# Patient Record
Sex: Female | Born: 1965 | Race: White | Hispanic: No | Marital: Married | Smoking: Never smoker
Health system: Southern US, Community
[De-identification: ages and names within clinical notes are randomized; demographics above are authoritative.]

## PROBLEM LIST (undated history)

## (undated) DIAGNOSIS — F329 Major depressive disorder, single episode, unspecified: Secondary | ICD-10-CM

## (undated) DIAGNOSIS — I10 Essential (primary) hypertension: Secondary | ICD-10-CM

## (undated) DIAGNOSIS — F419 Anxiety disorder, unspecified: Secondary | ICD-10-CM

## (undated) DIAGNOSIS — Z87898 Personal history of other specified conditions: Secondary | ICD-10-CM

## (undated) DIAGNOSIS — R079 Chest pain, unspecified: Secondary | ICD-10-CM

## (undated) DIAGNOSIS — F32A Depression, unspecified: Secondary | ICD-10-CM

## (undated) DIAGNOSIS — J302 Other seasonal allergic rhinitis: Secondary | ICD-10-CM

## (undated) HISTORY — DX: Anxiety disorder, unspecified: F41.9

## (undated) HISTORY — DX: Personal history of other specified conditions: Z87.898

## (undated) HISTORY — DX: Essential (primary) hypertension: I10

## (undated) HISTORY — DX: Major depressive disorder, single episode, unspecified: F32.9

## (undated) HISTORY — DX: Chest pain, unspecified: R07.9

---

## 2009-05-10 ENCOUNTER — Ambulatory Visit: Payer: Self-pay | Admitting: Family Medicine

## 2009-08-02 ENCOUNTER — Ambulatory Visit: Payer: Self-pay | Admitting: Family Medicine

## 2009-08-02 DIAGNOSIS — R002 Palpitations: Secondary | ICD-10-CM | POA: Insufficient documentation

## 2009-09-04 ENCOUNTER — Ambulatory Visit: Payer: Self-pay | Admitting: Family Medicine

## 2010-05-21 NOTE — Assessment & Plan Note (Signed)
Summary: SINUS INFECTION/KH   Vital Signs:  Patient Profile:   45 Years Old Female CC:      Sinus congestion, pressure x 3 days Height:     65 inches Weight:      145 pounds O2 Sat:      100 % O2 treatment:    Room Air Temp:     99.9 degrees F oral Pulse rate:   82 / minute Pulse rhythm:   regular Resp:     16 per minute BP sitting:   112 / 76  (right arm) Cuff size:   regular  Vitals Entered By: Emilio Math (May 10, 2009 10:19 AM)                  Current Allergies: ! PENICILLIN ! SULFA ! CODEINEHistory of Present Illness Chief Complaint: Sinus congestion, pressure x 3 days History of Present Illness: AS ABOVE. NO FEVER. HX OF SINUS PROBLEMS. FACIAL PAIN. NO SORE THROAT OR COUGH. TAKING SUDAFED.   REVIEW OF SYSTEMS Constitutional Symptoms      Denies fever, chills, night sweats, weight loss, weight gain, and fatigue.  Eyes       Denies change in vision, eye pain, eye discharge, glasses, contact lenses, and eye surgery. Ear/Nose/Throat/Mouth       Complains of sinus problems.      Denies hearing loss/aids, change in hearing, ear pain, ear discharge, dizziness, frequent runny nose, frequent nose bleeds, sore throat, hoarseness, and tooth pain or bleeding.  Respiratory       Denies dry cough, productive cough, wheezing, shortness of breath, asthma, bronchitis, and emphysema/COPD.  Cardiovascular       Denies murmurs, chest pain, and tires easily with exhertion.    Gastrointestinal       Denies stomach pain, nausea/vomiting, diarrhea, constipation, blood in bowel movements, and indigestion. Genitourniary       Denies painful urination, kidney stones, and loss of urinary control. Neurological       Denies paralysis, seizures, and fainting/blackouts. Musculoskeletal       Denies muscle pain, joint pain, joint stiffness, decreased range of motion, redness, swelling, muscle weakness, and gout.  Skin       Denies bruising, unusual mles/lumps or sores, and hair/skin  or nail changes.  Psych       Denies mood changes, temper/anger issues, anxiety/stress, speech problems, depression, and sleep problems.  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 Exam General appearance: well developed, well nourished, no acute distress Eyes: conjunctivae and lids normal Ears: normal, no lesions or deformities Nasal: CONGESTED WITH FACIAL PAIN AND PRESSURE Oral/Pharynx: tongue normal, posterior pharynx without erythema or exudate Chest/Lungs: no rales, wheezes, or rhonchi bilateral, breath sounds equal without effort Heart: regular rate and  rhythm, no murmur Assessment New Problems: ACUTE SINUSITIS, UNSPECIFIED (ICD-461.9)   Plan New Medications/Changes: ZITHROMAX TRI-PAK 500 MG TABS (AZITHROMYCIN) TAKE AS DIRECTED  #1 x 0, 05/10/2009, Marvis Moeller DO  New Orders: New Patient Level III [99203]   Prescriptions: ZITHROMAX TRI-PAK 500 MG TABS (AZITHROMYCIN) TAKE AS DIRECTED  #1 x 0   Entered and Authorized by:   Marvis Moeller DO   Signed by:   Marvis Moeller DO on 05/10/2009   Method used:   Print then Give to Patient   RxID:   [id number]   Patient  Instructions: 1)  TYLENOL OR MOTRIN AS NEEDED. MUCINEX D RECOMMENDED. AVOID CAFFEINE AND MILK PRODUCTS. FOLLOW UP WITH YOUR PCP IF SYMPTOMS PERSIST OR WORSEN.

## 2010-05-21 NOTE — Assessment & Plan Note (Signed)
Summary: NOV cough   Vital Signs:  Patient profile:   45 year old female Height:      65 inches Weight:      144 pounds BMI:     24.05 O2 Sat:      100 % on Room air Temp:     99.3 degrees F oral Pulse rate:   66 / minute BP sitting:   124 / 84  (left arm) Cuff size:   regular  Vitals Entered By: Payton Spark CMA (August 02, 2009 10:24 AM)  O2 Flow:  Room air CC: New to est. Chest congestion, Cough, ringing in ears and ST after coughing. Started after painting on 07/28/09.   Primary Care Provider:  Seymour Bars DO  CC:  New to est. Chest congestion, Cough, and ringing in ears and ST after coughing. Started after painting on 07/28/09.Marland Kitchen  History of Present Illness: 45 yo WF presents for NOV.    She was previously seen at [facility] but moved to [place].  She sees [facility] for pap smears.  Her OB put her back on Lexapro for depression and anxiety 2 mos ago.  She had to change to Celexa due to insurance but it made her very sick with nausea.  She plans to restart Lexapro. tomorrow.  She had done Lexapro in the past and did well on it.    She has cough and congestion that started 5 days ago.  She has allergies.  She was painting over the weekened and that's when this started.  The paint semed to burn her nose and her throat.  She has ringing ears.  Her cough is very dry.  She denies a hx of asthma except in childhood.  She has some chest tightness now but not really SOB.  No fevers or chills.  She has not taken anything for allergies.  She thinks that Claritin caused her heart palpitations 3 yrs ago.  She has continued to have heart palpitations.  She saw a cardiologist for this about 4 ys ago and wore a heart monitor.  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    Current Medications (verified): 1)  Lexapro 5 Mg Tabs (Escitalopram Oxalate) .... Take 1 Tab By Mouth Once Daily  Allergies (verified): 1)  ! Penicillin 2)  ! Sulfa 3)  ! Codeine  Past History:  Past Medical History: G4P4 OB -  [facility] anxiety/ depression heart palpitations  Past Surgical History: Reviewed history from 05/10/2009 and no changes required. 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  Review of Systems       no fevers/sweats/weakness, unexplained wt loss/gain, no change in vision, no difficulty hearing, ringing in ears, no hay fever/+allergies, no CP/discomfort, no palpitations, no breast lump/nipple discharge, + cough/wheeze, no blood in stool,no  N/V/D, no nocturia, no leaking urine, no unusual vag bleeding, no vaginal/penile discharge, no muscle/joint pain, no rash, no new/changing mole, no HA, no memory loss, no anxiety, no sleep problem, no depression, no unexplained lumps, no easy bruising/bleeding, no concern with sexual function   Physical Exam  General:  alert, well-developed, well-nourished, and well-hydrated.   Head:  normocephalic and atraumatic.   Eyes:  conjunctiva clear no lid edema, watering or discharge Nose:  boggy turbinates with scant clear rhinorrhea Mouth:  o/p pink and moist, good dentition Neck:  no masses.   Lungs:  Normal  respiratory effort, chest expands symmetrically. Lungs are clear to auscultation, no crackles or wheezes. dry hacking cough Heart:  Normal rate and regular rhythm. S1 and S2 normal without gallop, murmur, click, rub or other extra sounds. Extremities:  no E/C/C Skin:  color normal and no rashes.   Cervical Nodes:  No lymphadenopathy noted Psych:  good eye contact, not anxious appearing, and not depressed appearing.     Impression & Recommendations:  Problem # 1:  CHEMICAL PNEUMONITIS (ICD-506.0) Secondary to inhalation of paint fumes and hx of allergies. Treat with 5 days of Prednisone 40 mg once a day and ProAir inhaler 2 puffs 4 x a day for 1 wk. Call if not improving. F/U in 1 month.  Avoid  reexposure.  Problem # 2:  PALPITATIONS (ICD-785.1) She had w/u for this 4 yrs ago with cards that was normal. She has seen OB for anxiety and goes back next wk for labs and new start Lexapro. Will see her back for f/u in 1 month -- review labs, get EKG.    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 Will try to come up with a better treatment plan for her -- perphaps a different antihistamine or just nasal steroid spray.  Complete Medication List: 1)  Lexapro 5 Mg Tabs (Escitalopram oxalate) .... Take 1 tab by mouth once daily 2)  Prednisone 20 Mg Tabs (Prednisone) .... 2 tabs by mouth qam x 5 days 3)  Proair Hfa 108 (90 Base) Mcg/act Aers (Albuterol sulfate) .... 2 puffs q 4- 6 hrs prn  Patient Instructions: 1)  Take 40 mg of prednsione once a day x 5 days for chemical pneumonitits.   2)  Use inhaler 2 puffs 4 x a day for the next wk, then use as needed. 3)  F/U with OB for labs/ Lexapro. 4)  Return in 1 month for f/u allergies and palpitations. Prescriptions: PROAIR HFA 108 (90 BASE) MCG/ACT AERS (ALBUTEROL SULFATE) 2 puffs q 4- 6 hrs prn  #1 x 0   Entered and Authorized by:   Seymour Bars DO   Signed by:   Seymour Bars DO on 08/02/2009   Method used:   Electronically to        [facility] [phone]* (retail)       [address]       [place], [place]  [address]       Ph: [phone]       Fax: [phone]   RxID:   [id number] PREDNISONE 20 MG TABS (PREDNISONE) 2 tabs by mouth qAM x 5 days  #10 x 0   Entered and Authorized by:   Seymour Bars DO   Signed by:   Seymour Bars DO on 08/02/2009   Method used:   Electronically to        [facility] [phone]* (retail)       [address]       [place], [place]  [address]       Ph: [phone]       Fax: [phone]   RxID:   [id number]

## 2010-11-20 ENCOUNTER — Inpatient Hospital Stay (INDEPENDENT_AMBULATORY_CARE_PROVIDER_SITE_OTHER)
Admission: RE | Admit: 2010-11-20 | Discharge: 2010-11-20 | Disposition: A | Discharge status: Home / Self Care | Payer: BC Managed Care – PPO | Source: Ambulatory Visit | Attending: Emergency Medicine | Admitting: Emergency Medicine

## 2010-11-20 ENCOUNTER — Encounter: Payer: Self-pay | Admitting: Emergency Medicine

## 2010-11-20 DIAGNOSIS — N390 Urinary tract infection, site not specified: Secondary | ICD-10-CM

## 2010-11-20 LAB — CONVERTED CEMR LAB
Nitrite: POSITIVE
Specific Gravity, Urine: 1.025
Urobilinogen, UA: 0.2
pH: 5.5

## 2010-11-22 ENCOUNTER — Telehealth (INDEPENDENT_AMBULATORY_CARE_PROVIDER_SITE_OTHER): Payer: Self-pay | Admitting: *Deleted

## 2011-03-24 NOTE — Progress Notes (Signed)
Summary: POSS UTI/TJ   Vital Signs:  Patient Profile:   45 Years Old Female CC:      dysuria and frequency x 2 days Height:     65 inches Weight:      157 pounds O2 Sat:      99 % O2 treatment:    Room Air Temp:     98.6 degrees F oral Pulse rate:   83 / minute Resp:     16 per minute BP sitting:   108 / 76  (left arm) Cuff size:   regular  Vitals Entered By: Lajean Saver RN (November 20, 2010 2:37 PM)                  Updated Prior Medication List: LEXAPRO 5 MG TABS (ESCITALOPRAM OXALATE) Take 1 tab by mouth once daily * BIRTH CONTROL- UNKNOWN NAME   Current Allergies: ! PENICILLIN ! SULFA ! CODEINE ! * PORK ! * PEANUTSHistory of Present Illness History from: patient Chief Complaint: dysuria and frequency x 2 days History of Present Illness: 45 Years Old Female complains of UTI symptoms for 2 days.  She describes the pain as burning during urination.  She has not used any OTC meds. + dysuria + frequency + urgency No hematuria No vaginal discharge No fever/chills + lower abdomenal pain ? back pain No fatigue   REVIEW OF SYSTEMS Constitutional Symptoms      Denies fever, chills, night sweats, weight loss, weight gain, and fatigue.  Eyes       Denies change in vision, eye pain, eye discharge, glasses, contact lenses, and eye surgery. Ear/Nose/Throat/Mouth       Denies hearing loss/aids, change in hearing, ear pain, ear discharge, dizziness, frequent runny nose, frequent nose bleeds, sinus problems, sore throat, hoarseness, and tooth pain or bleeding.  Respiratory       Denies dry cough, productive cough, wheezing, shortness of breath, asthma, bronchitis, and emphysema/COPD.  Cardiovascular       Denies murmurs, chest pain, and tires easily with exhertion.    Gastrointestinal       Denies stomach pain, nausea/vomiting, diarrhea, constipation, blood in bowel movements, and indigestion. Genitourniary       Complains of painful urination.      Denies kidney  stones and loss of urinary control.      Comments: frequency Neurological       Denies paralysis, seizures, and fainting/blackouts. Musculoskeletal       Denies muscle pain, joint pain, joint stiffness, decreased range of motion, redness, swelling, muscle weakness, and gout.  Skin       Denies bruising, unusual mles/lumps or sores, and hair/skin or nail changes.  Psych       Denies mood changes, temper/anger issues, anxiety/stress, speech problems, depression, and sleep problems. Other Comments: x 2 days   Past History:  Past Medical History: Reviewed history from 08/02/2009 and no changes required. G4P4 OB - [facility] anxiety/ depression heart palpitations  Past Surgical History: Reviewed history from 05/10/2009 and no changes required. Caesarean section  Family History: Reviewed history from 08/02/2009 and no changes required. Mother, Diabetes, HTN, Breast CA Father, D, Alcoholism, Lung CA  Social History: Reviewed history from 08/02/2009 and no changes required. 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  Plan New Medications/Changes: PHENAZOPYRIDINE HCL 200 MG TABS (PHENAZOPYRIDINE HCL) 1 by mouth three times a day for 2 days as needed for burning  #6 x 0, 11/20/2010, Hoyt Koch MD CIPROFLOXACIN HCL 500 MG TABS (CIPROFLOXACIN HCL) 1 by mouth two times a day for 5 days  #10 x 0, 11/20/2010, Hoyt Koch MD  New Orders: Est. Patient Level III [19147] UA Dipstick w/o Micro (automated)  [81003] T-Culture, Urine [[id number]] Planning Comments:   Urine  culture pending.  Follow-up with your primary care physician if not improving or if getting worse   The patient and/or caregiver has been counseled thoroughly with regard to medications prescribed including dosage, schedule, interactions, rationale for use, and possible side effects and they verbalize understanding.  Diagnoses and expected course of recovery discussed and will return if not improved as expected or if the condition worsens. Patient and/or caregiver verbalized understanding.  Prescriptions: PHENAZOPYRIDINE HCL 200 MG TABS (PHENAZOPYRIDINE HCL) 1 by mouth three times a day for 2 days as needed for burning  #6 x 0   Entered and Authorized by:   Hoyt Koch MD   Signed by:   Hoyt Koch MD on 11/20/2010   Method used:   Print then Give to Patient   RxID:   [id number] CIPROFLOXACIN HCL 500 MG TABS (CIPROFLOXACIN HCL) 1 by mouth two times a day for 5 days  #10 x 0   Entered and Authorized by:   Hoyt Koch MD   Signed by:   Hoyt Koch MD on 11/20/2010   Method used:   Print then Give to Patient   RxID:   [id number]   Orders Added: 1)  Est. Patient Level III [34742] 2)  UA Dipstick w/o Micro (automated)  [81003] 3)  T-Culture, Urine [[id number]]    Laboratory Results   Urine Tests  Date/Time Received: November 20, 2010 2:40 PM  Date/Time Reported: November 20, 2010 2:40 PM   Routine Urinalysis   Color: yellow Appearance: turbid Glucose: negative   (Normal Range: Negative) Bilirubin: negative   (Normal Range: Negative) Ketone: negative   (Normal Range: Negative) Spec. Gravity: 1.025   (Normal Range: 1.003-1.035) Blood: 2+   (Normal Range: Negative) pH: 5.5   (Normal Range: 5.0-8.0) Protein: trace   (Normal Range: Negative) Urobilinogen: 0.2   (Normal Range: 0-1) Nitrite: positive   (Normal Range: Negative) Leukocyte Esterace: 3+   (Normal Range: Negative)

## 2011-03-24 NOTE — Telephone Encounter (Signed)
  Phone Note Outgoing Call   Call placed by: Lajean Saver RN,  November 22, 2010 10:07 AM Call placed to: Patient Action Taken: Phone Call Completed Summary of Call: Callback: Patient reports she is improving and is taking antibiotics. Urine culture result given.

## 2011-06-06 ENCOUNTER — Emergency Department
Admission: EM | Admit: 2011-06-06 | Discharge: 2011-06-06 | Disposition: A | Discharge status: Home / Self Care | Payer: 59 | Source: Home / Self Care | Attending: Family Medicine | Admitting: Family Medicine

## 2011-06-06 ENCOUNTER — Emergency Department
Admit: 2011-06-06 | Discharge: 2011-06-06 | Disposition: A | Discharge status: Home / Self Care | Payer: 59 | Attending: Family Medicine | Admitting: Family Medicine

## 2011-06-06 ENCOUNTER — Encounter: Payer: Self-pay | Admitting: *Deleted

## 2011-06-06 DIAGNOSIS — M7750 Other enthesopathy of unspecified foot: Secondary | ICD-10-CM

## 2011-06-06 DIAGNOSIS — M7660 Achilles tendinitis, unspecified leg: Secondary | ICD-10-CM

## 2011-06-06 DIAGNOSIS — M7661 Achilles tendinitis, right leg: Secondary | ICD-10-CM

## 2011-06-06 DIAGNOSIS — M76829 Posterior tibial tendinitis, unspecified leg: Secondary | ICD-10-CM

## 2011-06-06 DIAGNOSIS — M7670 Peroneal tendinitis, unspecified leg: Secondary | ICD-10-CM

## 2011-06-06 HISTORY — DX: Major depressive disorder, single episode, unspecified: F32.9

## 2011-06-06 HISTORY — DX: Other seasonal allergic rhinitis: J30.2

## 2011-06-06 HISTORY — DX: Depression, unspecified: F32.A

## 2011-06-06 NOTE — ED Provider Notes (Signed)
History     CSN: [id number]  Arrival date & time 06/06/11  1478   First MD Initiated Contact with Patient 06/06/11 [phone]      Chief Complaint  Patient presents with  . Foot Pain    right heel      HPI Comments: Patient states that she stepped in parking lot "pot hole" 3 months ago and twisted her right foot and ankle.  The initial swelling and pain improved, and then worsened about one month ago.  She complains of persistent pain in her posterior ankle, and medial and lateral ankle.  Patient is a 46 y.o. female presenting with ankle pain. The history is provided by the patient.  Ankle Pain This is a chronic problem. Episode onset: 3 months ago. The problem occurs constantly. The problem has been gradually worsening. Pertinent negatives include no chest pain and no shortness of breath. The symptoms are aggravated by walking and standing. The symptoms are relieved by nothing. She has tried nothing for the symptoms.    Past Medical History  Diagnosis Date  . Depression   . Seasonal allergies     Past Surgical History  Procedure Date  . Cesarean section 1998    Family History  Problem Relation Age of Onset  . Cancer Mother     Breast  . Diabetes Mother   . Stroke Mother   . Heart failure Father   . Stroke Father     History  Substance Use Topics  . Smoking status: Non smoker  . Smokeless tobacco: Never Used  . Alcohol Use: No    OB History    Grav Para Term Preterm Abortions TAB SAB Ect Mult Living                  Review of Systems  Respiratory: Negative for shortness of breath.   Cardiovascular: Negative for chest pain.  All other systems reviewed and are negative.    Allergies  Codeine; Peanut-containing drug products; Penicillins; Pork allergy; and Sulfonamide derivatives  Home Medications   Current Outpatient Rx  Name Route Sig Dispense Refill  . ESCITALOPRAM OXALATE 10 MG PO TABS Oral Take 10 mg by mouth daily.    . IBUPROFEN 200 MG PO TABS Oral  Take 200 mg by mouth every 6 (six) hours as needed.    Marland Kitchen LORATADINE 10 MG PO TABS Oral Take 10 mg by mouth daily.    Marland Kitchen NORGESTREL-ETHINYL ESTRADIOL 0.3-30 MG-MCG PO TABS Oral Take 1 tablet by mouth daily.      BP 116/81  Pulse 75  Temp(Src) 98.5 F (36.9 C) (Oral)  Resp 16  Ht 5\' 5"  (1.651 m)  Wt 163 lb (73.936 kg)  BMI 27.12 kg/m2  SpO2 98%  LMP 06/05/2010  Physical Exam Nursing notes and Vital Signs reviewed. Appearance:  Patient appears healthy, stated age, and in no acute distress Right lower extremity:  No edema.  No calf tenderness Right foot/ankle:  No deformities.  Ankle range of motion slightly decreased.  There is distinct tenderness at insertion of achilles tendon.  No swelling, erythema, or warmth.  There is tenderness just posterior to both the lateral malleolus and the medial malleolus.  Foot reveals tenderness at the base of 5th metatarsal.     ED Course  Procedures  none  Labs Reviewed - No data to display Dg Ankle Complete Right  06/06/2011  *RADIOLOGY REPORT*  Clinical Data:  Right ankle injury with pain.  RIGHT ANKLE - COMPLETE 3+ VIEW  Comparison:  None.  Findings:  There is no evidence of fracture, dislocation, or joint effusion.  There is no evidence of arthropathy or other focal bone abnormality.  Soft tissues are unremarkable.  IMPRESSION: Negative.  Original Report Authenticated By: Reola Calkins, M.D.   Dg Foot Complete Right  06/06/2011  *RADIOLOGY REPORT*  Clinical Data: Remote injury.  Heel and lateral foot pain.  RIGHT FOOT COMPLETE - 3+ VIEW  Comparison: None.  Findings: No acute bony abnormality.  Specifically, no fracture, subluxation, or dislocation.  Soft tissues are intact.  IMPRESSION: Normal study.  Original Report Authenticated By: Cyndie Chime, M.D.     1. Posterior tibial tendonitis   2. Peroneal tendonitis   3. Tendonitis, Achilles, right       MDM  AirCast splint applied.  Advised to ensure well fitting shoe inserts with  adequate arch support. Begin applying ice pack 2 or 3 times daily.  Begin Ibuprofen 200mg , 4 tabs every 8 hours with food.  Begin exercises as per instruction sheet.  Wear AirCast splint 2 to 3 weeks. Followup with [facility] if not improving about two weeks.         Donna Christen, MD 06/07/11 [phone]

## 2011-06-06 NOTE — ED Notes (Signed)
Right heel pain for 3 months after she stepped in a hole and twisted her ankle. Pain is a 8/10 when she first stands and then lowers to 6/10 after walking. Pain is constant and has been worse over the last month. She states the pain is moderate to severe. She did try Advil with little relief.

## 2011-06-06 NOTE — Discharge Instructions (Signed)
Begin applying ice pack 2 or 3 times daily.  Begin Ibuprofen 200mg , 4 tabs every 8 hours with food.  Begin exercises as per instruction sheet.  Wear AirCast splint 2 to 3 weeks.

## 2011-06-26 ENCOUNTER — Other Ambulatory Visit: Payer: Self-pay | Admitting: Sports Medicine

## 2011-06-26 DIAGNOSIS — R52 Pain, unspecified: Secondary | ICD-10-CM

## 2011-07-05 ENCOUNTER — Ambulatory Visit
Admission: RE | Admit: 2011-07-05 | Discharge: 2011-07-05 | Disposition: A | Discharge status: Home / Self Care | Payer: 59 | Source: Ambulatory Visit | Attending: Sports Medicine | Admitting: Sports Medicine

## 2011-07-05 DIAGNOSIS — R52 Pain, unspecified: Secondary | ICD-10-CM

## 2011-07-14 ENCOUNTER — Ambulatory Visit: Payer: 59 | Attending: Sports Medicine | Admitting: Physical Therapy

## 2011-07-15 ENCOUNTER — Ambulatory Visit: Payer: 59 | Admitting: Physical Therapy

## 2011-07-16 ENCOUNTER — Encounter: Payer: 59 | Admitting: Physical Therapy

## 2012-03-22 ENCOUNTER — Encounter (HOSPITAL_COMMUNITY): Payer: Self-pay | Admitting: Psychiatry

## 2012-03-22 ENCOUNTER — Ambulatory Visit (INDEPENDENT_AMBULATORY_CARE_PROVIDER_SITE_OTHER): Payer: BC Managed Care – PPO | Admitting: Psychiatry

## 2012-03-22 ENCOUNTER — Telehealth (HOSPITAL_COMMUNITY): Payer: Self-pay

## 2012-03-22 VITALS — BP 147/95 | HR 78 | Ht 66.5 in | Wt 162.0 lb

## 2012-03-22 DIAGNOSIS — F331 Major depressive disorder, recurrent, moderate: Secondary | ICD-10-CM

## 2012-03-22 MED ORDER — FLUOXETINE HCL 20 MG PO CAPS: 20.0000 mg | ORAL_CAPSULE | Freq: Every day | ORAL | Status: DC

## 2012-03-22 MED ORDER — BUPROPION HCL ER (XL) 150 MG PO TB24: 150.0000 mg | ORAL_TABLET | ORAL | Status: DC

## 2012-03-22 MED ORDER — FLUOXETINE HCL 40 MG PO CAPS: 40.0000 mg | ORAL_CAPSULE | Freq: Every day | ORAL | Status: DC

## 2012-03-22 NOTE — Telephone Encounter (Signed)
Have resent prescriptions for medications.

## 2012-03-22 NOTE — Telephone Encounter (Signed)
Can you please resend the prozac as 40mg  to [facility]
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
## 2012-03-25 ENCOUNTER — Ambulatory Visit: Payer: Self-pay | Admitting: Family Medicine

## 2012-03-31 ENCOUNTER — Encounter: Payer: Self-pay | Admitting: Family Medicine

## 2012-03-31 ENCOUNTER — Ambulatory Visit (INDEPENDENT_AMBULATORY_CARE_PROVIDER_SITE_OTHER): Payer: BC Managed Care – PPO | Admitting: Family Medicine

## 2012-03-31 VITALS — BP 134/92 | HR 110 | Temp 98.3°F | Wt 157.0 lb

## 2012-03-31 DIAGNOSIS — R0602 Shortness of breath: Secondary | ICD-10-CM

## 2012-03-31 DIAGNOSIS — F329 Major depressive disorder, single episode, unspecified: Secondary | ICD-10-CM

## 2012-03-31 DIAGNOSIS — Z8679 Personal history of other diseases of the circulatory system: Secondary | ICD-10-CM

## 2012-03-31 DIAGNOSIS — R079 Chest pain, unspecified: Secondary | ICD-10-CM

## 2012-03-31 DIAGNOSIS — R002 Palpitations: Secondary | ICD-10-CM

## 2012-03-31 DIAGNOSIS — G43909 Migraine, unspecified, not intractable, without status migrainosus: Secondary | ICD-10-CM

## 2012-03-31 DIAGNOSIS — Z87898 Personal history of other specified conditions: Secondary | ICD-10-CM

## 2012-03-31 HISTORY — DX: Chest pain, unspecified: R07.9

## 2012-03-31 HISTORY — DX: Major depressive disorder, single episode, unspecified: F32.9

## 2012-03-31 HISTORY — DX: Personal history of other specified conditions: Z87.898

## 2012-03-31 MED ORDER — PROMETHAZINE HCL 50 MG PO TABS: ORAL_TABLET | ORAL | Status: DC

## 2012-03-31 NOTE — Progress Notes (Signed)
CC: Darlene Ochoa is a 46 y.o. female is here for Establish Care and hospital f/u   Subjective: HPI:  Patient presents to establish care.  Quite a significant past medical history including hospitalization [facility] on December 7-9. This visit was prompted by chest pain, shortness of breath, and palpitations that have been occurring for 2 weeks prior to admission. Prior to this the patient reports losing vision in her left eye for 30 minutes on the week of Thanksgiving, described as a curtain over her vision that resolved spontaneously without any intervention. Additionally for the past month or 2 she has had a decline in her depression as she describes lying on the couch and sleeping throughout the day only waking to do laundry, and 2 children, and cook.  Patient's description of her admission and a review of outside reports and records show that she had quite a reassuring workup. Cardiac enzymes were negative x4, treadmill stress test negative, nuclear cardiac perfusion exam normal, carotid ultrasounds normal, CT of the head normal, normal TSH, A1c of 4.9 no major abnormalities on CBC his with differential, unremarkable metabolic panels. Negative d-dimer  Interestingly the patient had multiple episodes of dizziness, shortness of breath, and subjective tachycardia during observation which reportedly never revealed desaturations, objective tachycardia nor any other abnormal vitals.  Since discharge she complains of episodes throughout the week of something sitting on her chest, shortness of breath that often wakes her from sleep, racing heartbeat at all last for matter of minutes which she was able to improve/resolve with focused deep breathing. The symptoms can occur at rest, when carrying laundry, or when out in public, she's under able to identify specific triggers. She reports having these symptoms before starting Wellbutrin.  She's concerned because she has a past medical history  of migraines that occur once every one to 2 weeks, described as a sharp pulsatile pressure in the left back of her head that slowly migrated forward if she doesn't take Imitrex minutes after it starts. As the pain involves it is associated with nausea and photophobia. She is never been on suppressive therapy. Imitrex was stopped  during her admission due to concerns of serotonin syndrome. She inquires about replacement medication.  Review of Systems - General ROS: negative for - chills, fever, night sweats, weight gain or weight loss Ophthalmic ROS: negative for - decreased vision since discharge Psychological ROS: negative for worsening depression or anxiety ENT ROS: negative for - hearing change, nasal congestion, tinnitus or allergies Hematological and Lymphatic ROS: negative for - bleeding problems, bruising or swollen lymph nodes Breast ROS: negative Respiratory ROS: no cough,  wheezing Cardiovascular ROS: See history of present illness Gastrointestinal ROS: no abdominal pain, change in bowel habits, or black or bloody stools Genito-Urinary ROS: negative for - genital discharge, genital ulcers, incontinence or abnormal bleeding from genitals Musculoskeletal ROS: negative for - joint pain or muscle pain Neurological ROS: negative for - headaches or memory loss Dermatological ROS: negative for lumps, mole changes, rash and skin lesion changes    Review Of Systems Outlined In HPI  Past Medical History  Diagnosis Date  . Depression   . Seasonal allergies   . Hypertension   . Major depressive disorder 03/31/2012  . Chest pain 03/31/2012  . H/O prolonged Q-T interval on ECG 03/31/2012     Family History  Problem Relation Age of Onset  . Diabetes Mother   . Cancer Mother     Breast  . Depression Mother   .  Heart failure Father   . Stroke Father   . Alcohol abuse Father   . Alcohol abuse Brother   . Drug abuse Brother      History  Substance Use Topics  . Smoking status:  Never Smoker   . Smokeless tobacco: Never Used  . Alcohol Use: No     Objective: Filed Vitals:   03/31/12 0844  BP: 134/92  Pulse: 110  Temp: 98.3 F (36.8 C)    General: Alert and Oriented, No Acute Distress HEENT: Pupils equal, round, reactive to light. Conjunctivae clear.  External ears unremarkable, canals clear with intact TMs with appropriate landmarks.  Middle ear appears open without effusion. Pink inferior turbinates.  Moist mucous membranes, pharynx without inflammation nor lesions.  Neck supple without palpable lymphadenopathy nor abnormal masses. Lungs: Clear to auscultation bilaterally, no wheezing/ronchi/rales.  Comfortable work of breathing. Good air movement. Cardiac: Regular rate and rhythm. Normal S1/S2.  No murmurs, rubs, nor gallops.   Abdomen: Soft nontender to palpation Neuro: CN II-XII grossly intact, full strength/rom of all four extremities, C5/L4/S1 DTRs 2/4 bilaterally, gait normal, rapid alternating movements normal, Extremities: No peripheral edema.  Strong peripheral pulses.  Mental Status: No depression, anxiety, nor agitation. Pleasant, well-dressed, normal/logical thought process, makes good eye contact Skin: Warm and dry.  Assessment & Plan: Darlene Ochoa was seen today for establish care and hospital f/u.  Diagnoses and associated orders for this visit:  Palpitations  Major depressive disorder  Chest pain  Shortness of breath - Pulmonary function test; Future  Migraine - promethazine (PHENERGAN) 50 MG tablet; Take with two Aleve at onset of migraine.  H/o prolonged q-t interval on ecg      Maj. depressive disorder: Sounds to be improving, she continues to Dr. Demetrius Charity in behavioral health for medication management. Chest pain and shortness of breath: Unchanged however have a very low suspicion for cardiac etiology given her unremarkable monitored stay at [facility], And very reassuring vascular and cardiac studies. This reassurance  was discussed with the patient.  Suspicious that anxiety may play into her shortness of breath, chest pain, and fatigue however will rule out more serious pathology with PFTs. Migraine: I expect that this will be problematic now that Imitrex is no longer safe for, we will try promethazine and Aleve at the onset of headaches.  45 minutes spent face-to-face during visit today of which at least 50% was counseling or coordinating care.   Followup will be based on the results from the PFTs, I've asked her to return to 3 days after the PFTs to discuss further management.

## 2012-04-20 ENCOUNTER — Ambulatory Visit (INDEPENDENT_AMBULATORY_CARE_PROVIDER_SITE_OTHER): Payer: BC Managed Care – PPO | Admitting: Internal Medicine

## 2012-04-20 DIAGNOSIS — R0602 Shortness of breath: Secondary | ICD-10-CM

## 2012-04-20 LAB — PULMONARY FUNCTION TEST

## 2012-04-20 NOTE — Progress Notes (Signed)
PFT done today. 

## 2012-04-25 ENCOUNTER — Telehealth: Payer: Self-pay | Admitting: Family Medicine

## 2012-04-25 NOTE — Telephone Encounter (Signed)
[name], Will you please let Darlene Ochoa know that her lung function tests were very reassuring and did not show any abnormalities.  Given this along with all the reassuring studies during her hospitalization I'm led to believe that her chest pain, shortness of breath, and racing heart beat are all likely due to stress and/or anxiety.  I'd like her to follow up with Dr. Demetrius Charity in order to update him on all the recent reassuring studies. Thank you

## 2012-04-26 ENCOUNTER — Encounter (HOSPITAL_COMMUNITY): Payer: Self-pay | Admitting: Psychiatry

## 2012-04-26 ENCOUNTER — Ambulatory Visit (INDEPENDENT_AMBULATORY_CARE_PROVIDER_SITE_OTHER): Payer: BC Managed Care – PPO | Admitting: Psychiatry

## 2012-04-26 VITALS — BP 131/93 | HR 77 | Ht 65.0 in | Wt 162.0 lb

## 2012-04-26 DIAGNOSIS — F331 Major depressive disorder, recurrent, moderate: Secondary | ICD-10-CM

## 2012-04-26 DIAGNOSIS — F332 Major depressive disorder, recurrent severe without psychotic features: Secondary | ICD-10-CM

## 2012-04-26 MED ORDER — FLUOXETINE HCL 20 MG PO CAPS: 60.0000 mg | ORAL_CAPSULE | Freq: Every day | ORAL | Status: DC

## 2012-04-26 MED ORDER — BUPROPION HCL ER (XL) 150 MG PO TB24: 150.0000 mg | ORAL_TABLET | ORAL | Status: DC

## 2012-04-26 NOTE — Telephone Encounter (Signed)
Pt notified and did have an appt with Dr. Demetrius Charity this am and has scheduled an appt with Dr. Ivan Anchors to discuss BP.  Dr. Demetrius Charity was concerned that her dystolic number was still elevated
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
## 2012-04-27 ENCOUNTER — Encounter: Payer: Self-pay | Admitting: Family Medicine

## 2012-04-27 ENCOUNTER — Ambulatory Visit (INDEPENDENT_AMBULATORY_CARE_PROVIDER_SITE_OTHER): Payer: Self-pay | Admitting: Family Medicine

## 2012-04-27 VITALS — BP 134/81 | HR 85 | Wt 162.0 lb

## 2012-04-27 DIAGNOSIS — I10 Essential (primary) hypertension: Secondary | ICD-10-CM | POA: Insufficient documentation

## 2012-04-27 DIAGNOSIS — F419 Anxiety disorder, unspecified: Secondary | ICD-10-CM

## 2012-04-27 DIAGNOSIS — G43909 Migraine, unspecified, not intractable, without status migrainosus: Secondary | ICD-10-CM

## 2012-04-27 DIAGNOSIS — F411 Generalized anxiety disorder: Secondary | ICD-10-CM

## 2012-04-27 MED ORDER — PROPRANOLOL HCL 40 MG PO TABS: 40.0000 mg | ORAL_TABLET | Freq: Two times a day (BID) | ORAL | Status: DC

## 2012-04-27 NOTE — Progress Notes (Signed)
CC: Darlene Ochoa is a 47 y.o. female is here for Hypertension   Subjective: HPI:  Patient returns in good spirits  Anxiety: She reports is improving after Prozac was recently increased and reassuring pulmonary function tests obtained last week. She continues to have palpitations, shortness of breath, vague sense of fear that only occur during times of stress. She is unable to describe specific triggers but admits that there's been some turmoil in relationships throughout her family that have likely been contributing to these episodes. Overall she feels that she's improving but she is somewhat discouraged with the palpitations and shortness of breath. Symptoms can come on anytime of the day, there are mild to moderate severity, controlled breathing and distractions are the only thing that seemed to help.  Migraine: She reports a unilateral pounding photophobia and phonophobia associated headache that occurs on average twice a month. It responds adequately to Imitrex. There have been no change in frequency or character of these headaches in the past months. She has never taken prophylactic medications. She cannot identify any triggers. Nothing seems to make them better or worse. It can happen anytime of the day.  Essential hypertension: On 3 consecutive visits with [place] and in partner clinics she has had elevated diastolic blood pressures. She reports noticing a diastolic over 100 at local pharmacy earlier this week. She's never been on antihypertensives before. She denies chest pain, motor or sensory disturbances, orthopnea, peripheral edema. She tries to stay active but no formal exercise program, tries to watch her she eats.    Review Of Systems Outlined In HPI  Past Medical History  Diagnosis Date  . Depression   . Seasonal allergies   . Hypertension   . Major depressive disorder 03/31/2012  . Chest pain 03/31/2012  . H/O prolonged Q-T interval on ECG 03/31/2012     Family History  Problem  Relation Age of Onset  . Diabetes Mother   . Cancer Mother     Breast  . Depression Mother   . Heart failure Father   . Stroke Father   . Alcohol abuse Father   . Alcohol abuse Brother   . Drug abuse Brother      History  Substance Use Topics  . Smoking status: Never Smoker   . Smokeless tobacco: Never Used  . Alcohol Use: No     Objective: Filed Vitals:   04/27/12 0903  BP: 134/81  Pulse: 85    General: Alert and Oriented, No Acute Distress HEENT: Pupils equal, round, reactive to light. Conjunctivae clear.  External ears unremarkable, canals clear with intact TMs with appropriate landmarks. Moist mucous membranes, pharynx without inflammation nor lesions.  Neck supple without palpable lymphadenopathy nor abnormal masses. Lungs: Clear to auscultation bilaterally, no wheezing/ronchi/rales.  Comfortable work of breathing. Good air movement. Cardiac: Regular rate and rhythm. Normal S1/S2.  No murmurs, rubs, nor gallops.   Extremities: No peripheral edema.  Strong peripheral pulses.  Mental Status: No depression, anxiety, nor agitation. Skin: Warm and dry.  Assessment & Plan: Darlene Ochoa was seen today for hypertension.  Diagnoses and associated orders for this visit:  Essential hypertension - propranolol (INDERAL) 40 MG tablet; Take 1 tablet (40 mg total) by mouth 2 (two) times daily.  Migraine  Anxiety    Anxiety: Much improved, stable. She'll continue to titrate Prozac based on Dr. Durene Fruits recommendation. Essential hypertension: Uncontrolled, we will start propranolol in hopes that this might help migraine prevention and also tolerance or prevention of any sympathetic surge contributing  to her palpitations and anxiety. Migraine: Stable however will attempt to see if propranolol helps reduce severity and frequency, for now continue Imitrex as needed.  25 minutes spent face-to-face during visit today of which at least 50% was counseling or coordinating care regarding anxiety,  headaches anxiety, migraine, essential hypertension.   Return in about 4 weeks (around 05/25/2012).

## 2012-04-29 ENCOUNTER — Encounter: Payer: Self-pay | Admitting: *Deleted

## 2012-05-07 ENCOUNTER — Ambulatory Visit (INDEPENDENT_AMBULATORY_CARE_PROVIDER_SITE_OTHER): Payer: BC Managed Care – PPO | Admitting: Family Medicine

## 2012-05-07 VITALS — BP 132/86 | HR 56 | Wt 160.0 lb

## 2012-05-07 DIAGNOSIS — I10 Essential (primary) hypertension: Secondary | ICD-10-CM

## 2012-05-07 DIAGNOSIS — R002 Palpitations: Secondary | ICD-10-CM

## 2012-05-07 DIAGNOSIS — F419 Anxiety disorder, unspecified: Secondary | ICD-10-CM

## 2012-05-07 DIAGNOSIS — F411 Generalized anxiety disorder: Secondary | ICD-10-CM

## 2012-05-07 MED ORDER — HYDROCHLOROTHIAZIDE 25 MG PO TABS: 25.0000 mg | ORAL_TABLET | Freq: Every day | ORAL | Status: DC
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
## 2012-05-11 ENCOUNTER — Ambulatory Visit: Payer: Self-pay | Admitting: Family Medicine

## 2012-05-13 ENCOUNTER — Encounter (HOSPITAL_COMMUNITY): Payer: Self-pay | Admitting: Psychiatry

## 2012-05-13 ENCOUNTER — Ambulatory Visit (INDEPENDENT_AMBULATORY_CARE_PROVIDER_SITE_OTHER): Payer: BC Managed Care – PPO | Admitting: Psychiatry

## 2012-05-13 VITALS — BP 130/99 | HR 89 | Ht 65.0 in | Wt 162.0 lb

## 2012-05-13 DIAGNOSIS — F331 Major depressive disorder, recurrent, moderate: Secondary | ICD-10-CM

## 2012-05-13 DIAGNOSIS — F332 Major depressive disorder, recurrent severe without psychotic features: Secondary | ICD-10-CM

## 2012-05-13 DIAGNOSIS — F411 Generalized anxiety disorder: Secondary | ICD-10-CM

## 2012-05-13 MED ORDER — BUPROPION HCL 100 MG PO TABS: 100.0000 mg | ORAL_TABLET | ORAL | Status: DC
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
## 2012-05-24 ENCOUNTER — Ambulatory Visit (HOSPITAL_COMMUNITY): Payer: Self-pay | Admitting: Psychiatry

## 2012-06-03 ENCOUNTER — Ambulatory Visit (HOSPITAL_COMMUNITY): Payer: Self-pay | Admitting: Psychiatry

## 2012-06-09 ENCOUNTER — Other Ambulatory Visit: Payer: Self-pay | Admitting: *Deleted

## 2012-06-09 MED ORDER — HYDROCHLOROTHIAZIDE 25 MG PO TABS: 25.0000 mg | ORAL_TABLET | Freq: Every day | ORAL | Status: DC

## 2012-06-10 ENCOUNTER — Ambulatory Visit (INDEPENDENT_AMBULATORY_CARE_PROVIDER_SITE_OTHER): Payer: BC Managed Care – PPO | Admitting: Psychiatry

## 2012-06-10 ENCOUNTER — Encounter (HOSPITAL_COMMUNITY): Payer: Self-pay | Admitting: Psychiatry

## 2012-06-10 VITALS — BP 135/84 | HR 81 | Ht 65.0 in | Wt 156.0 lb

## 2012-06-10 DIAGNOSIS — F331 Major depressive disorder, recurrent, moderate: Secondary | ICD-10-CM

## 2012-06-10 DIAGNOSIS — F332 Major depressive disorder, recurrent severe without psychotic features: Secondary | ICD-10-CM
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
## 2012-06-15 ENCOUNTER — Telehealth (HOSPITAL_COMMUNITY): Payer: Self-pay

## 2012-06-15 NOTE — Telephone Encounter (Signed)
The patient reports that she had increased anxiety and felt worse over the weekend but her symptoms have improved for the past few days.   PLAN: Will hold prozac at 60 mg. Will consider increase if needed in the next week.

## 2012-06-15 NOTE — Telephone Encounter (Signed)
STOPPED WELLBUTRIN ON Friday AND WEEKEND WAS HORRIBLE, BP WAS UP TODAY NOT FEELING AS BAD AND BP FEELS LIKE STILL UP. CONCERNED FEELS LIKE SHE IS BACK SLIPPING.

## 2012-06-18 ENCOUNTER — Telehealth (HOSPITAL_COMMUNITY): Payer: Self-pay | Admitting: Psychiatry

## 2012-06-18 MED ORDER — BUPROPION HCL 75 MG PO TABS: 75.0000 mg | ORAL_TABLET | Freq: Every day | ORAL | Status: DC

## 2012-06-18 NOTE — Telephone Encounter (Signed)
Patient reports that she has been feeling less energy.  She states that she took her last dose of bupropion over one week ago.

## 2012-06-21 ENCOUNTER — Telehealth (HOSPITAL_COMMUNITY): Payer: Self-pay

## 2012-06-21 DIAGNOSIS — F331 Major depressive disorder, recurrent, moderate: Secondary | ICD-10-CM

## 2012-06-21 MED ORDER — FLUOXETINE HCL 40 MG PO CAPS: 80.0000 mg | ORAL_CAPSULE | Freq: Every day | ORAL | Status: DC

## 2012-06-21 NOTE — Telephone Encounter (Signed)
Called patient. She states that she did well over the weekend. She states today she has a lump in her throat.  She reports she states her energy levels were better with Wellbutrin. She cannot identify any triggers.   PLAN: Will increase Prozac to 80 mg, and continue wellbutrin at 75 mg. Will D/C wellbutrin in 2 weeks.

## 2012-07-01 ENCOUNTER — Telehealth (HOSPITAL_COMMUNITY): Payer: Self-pay

## 2012-07-01 NOTE — Telephone Encounter (Signed)
HAVING PROBLEMS WAKING UP IN THE MORNING. FALLING BACK TO SLEEP HARD TO GET UP. BELIEVES IT MAY BE THE PROZAC

## 2012-07-01 NOTE — Telephone Encounter (Signed)
The patient reports continued to have anxiety, partially related to the unresolved issue of her mother's care. She denies any other reasons for her anxiety.  PLAN: Will have trial discontinuation of bupropion. Asked patient to call back tomorrow and Monday.

## 2012-07-08 ENCOUNTER — Telehealth (HOSPITAL_COMMUNITY): Payer: Self-pay | Admitting: Psychiatry

## 2012-07-08 ENCOUNTER — Telehealth (HOSPITAL_COMMUNITY): Payer: Self-pay

## 2012-07-08 NOTE — Telephone Encounter (Addendum)
Called patient.  She reports continued anhedonia. Patient denies SI/HI/AVH.  She states she took her last dose of bupropion 75 daily and states he anxiety has decreased but he depression has worsened.  She states she has accepted the fact that her mother is okay.  She is also accepting the fact her symptoms stem from her need to please everyone and worry about everyone needs constantly. She states she is tired of feeling bad.  PLAN:  Patient has appointment on 07/12/2012.

## 2012-07-08 NOTE — Telephone Encounter (Signed)
Called patient asked her to reschedule appointment.

## 2012-07-12 ENCOUNTER — Encounter: Payer: Self-pay | Admitting: Family Medicine

## 2012-07-12 ENCOUNTER — Ambulatory Visit (HOSPITAL_COMMUNITY): Payer: Self-pay | Admitting: Psychiatry

## 2012-07-12 ENCOUNTER — Ambulatory Visit (INDEPENDENT_AMBULATORY_CARE_PROVIDER_SITE_OTHER): Payer: BC Managed Care – PPO | Admitting: Family Medicine

## 2012-07-12 VITALS — BP 126/84 | HR 93 | Temp 98.6°F | Wt 153.0 lb

## 2012-07-12 DIAGNOSIS — B9689 Other specified bacterial agents as the cause of diseases classified elsewhere: Secondary | ICD-10-CM

## 2012-07-12 DIAGNOSIS — A499 Bacterial infection, unspecified: Secondary | ICD-10-CM

## 2012-07-12 DIAGNOSIS — R1013 Epigastric pain: Secondary | ICD-10-CM

## 2012-07-12 DIAGNOSIS — J329 Chronic sinusitis, unspecified: Secondary | ICD-10-CM

## 2012-07-12 MED ORDER — PREDNISONE 20 MG PO TABS: ORAL_TABLET | ORAL | Status: DC

## 2012-07-12 MED ORDER — DOXYCYCLINE HYCLATE 100 MG PO TABS: ORAL_TABLET | ORAL | Status: DC

## 2012-07-12 NOTE — Progress Notes (Signed)
CC: Darlene Ochoa is a 47 y.o. female is here for Sinusitis   Subjective: HPI:  Patient complains of facial pressure, fever, chills, runny nose that started Friday night. Facial pressure is described as moderate in severity, nonradiating, localized beneath both eyes. Symptoms slightly improved with pseudoephedrine. Symptoms seem to be worse in dusty environments and when outside.  No interventions otherwise. Symptoms are persistent throughout the day. She denies shortness of breath, wheezing, dysphagia, motor or sensory disturbances.  Patient complains of chronic abdominal pain localized in epigastric region just slightly on the right. Pain fluctuates between nonexistent to mild, worsened with coughing or stretching to the back and the right. Pain is usually associated with a small bulge in the abdomen at the site of her discomfort.  Symptoms are improved with pressing on this part of her abdomen. Pain is not related to dietary habits or bowel movements. She denies nausea, vomiting, diarrhea, nor tar/bloody stools    Review Of Systems Outlined In HPI  Past Medical History  Diagnosis Date  . Depression   . Seasonal allergies   . Hypertension   . Major depressive disorder 03/31/2012  . Chest pain 03/31/2012  . H/O prolonged Q-T interval on ECG 03/31/2012     Family History  Problem Relation Age of Onset  . Diabetes Mother   . Cancer Mother     Breast  . Depression Mother   . CAD Mother   . Heart failure Father   . Stroke Father   . Alcohol abuse Father   . Alcohol abuse Brother   . Drug abuse Brother      History  Substance Use Topics  . Smoking status: Never Smoker   . Smokeless tobacco: Never Used  . Alcohol Use: No     Objective: Filed Vitals:   07/12/12 1017  BP: 126/84  Pulse: 93  Temp: 98.6 F (37 C)    General: Alert and Oriented, No Acute Distress HEENT: Pupils equal, round, reactive to light. Conjunctivae clear.  External ears unremarkable, canals clear with  intact TMs with appropriate landmarks.  Middle ear appears open without effusion. Pink inferior turbinates.  Moist mucous membranes, pharynx without inflammation nor lesions.  Neck supple right anterior cervical chain mild lymphadenopathy. Maxillary sinus tenderness bilaterally Lungs: Clear to auscultation bilaterally, no wheezing/ronchi/rales.  Comfortable work of breathing. Good air movement. Cardiac: Regular rate and rhythm. Normal S1/S2.  No murmurs, rubs, nor gallops.   Abdomen: Normal bowel sounds, soft and non tender without palpable masses. Extremities: No peripheral edema.  Strong peripheral pulses.  Mental Status: No depression, anxiety, nor agitation. Skin: Warm and dry.  Assessment & Plan: Aleeta was seen today for sinusitis.  Diagnoses and associated orders for this visit:  Bacterial sinusitis - doxycycline (VIBRA-TABS) 100 MG tablet; One by mouth twice a day for ten days. - predniSONE (DELTASONE) 20 MG tablet; Two tabs at once daily for five days.  Abdominal pain, epigastric    Abdominal pain: Discussed with patient that her symptoms sound like she may have a small hernia, she would like to avoid a CT scan at this time due to lack of pain, she agrees to call if the bulging reappears with pain at that point we'll schedule CT scan.Signs and symptoms requring emergent/urgent reevaluation were discussed with the patient. Bacterial sinusitis: Start doxycycline, if not improving in 2 days start prednisone burst, encouraged to start Flonase that she already has at home  Return if symptoms worsen or fail to improve.

## 2012-07-13 ENCOUNTER — Telehealth: Payer: Self-pay

## 2012-07-13 DIAGNOSIS — J329 Chronic sinusitis, unspecified: Secondary | ICD-10-CM

## 2012-07-13 DIAGNOSIS — B9689 Other specified bacterial agents as the cause of diseases classified elsewhere: Secondary | ICD-10-CM

## 2012-07-13 MED ORDER — LEVOFLOXACIN 500 MG PO TABS: 500.0000 mg | ORAL_TABLET | Freq: Every day | ORAL | Status: DC

## 2012-07-13 NOTE — Telephone Encounter (Signed)
[name], Will you please let Darlene Ochoa know that I'm sorry to hear about her reaction, I've sent in an Rx for levaquin to her [facility] and added doxycycline to her allergy list.

## 2012-07-13 NOTE — Telephone Encounter (Signed)
Pt.notified

## 2012-07-13 NOTE — Telephone Encounter (Signed)
Darlene Ochoa states the doxycycline caused vomiting after the 2 nd dose. Can she have something different?

## 2012-07-16 ENCOUNTER — Encounter (HOSPITAL_COMMUNITY): Payer: Self-pay | Admitting: Psychiatry

## 2012-07-16 ENCOUNTER — Ambulatory Visit (INDEPENDENT_AMBULATORY_CARE_PROVIDER_SITE_OTHER): Payer: BC Managed Care – PPO | Admitting: Psychiatry

## 2012-07-16 VITALS — BP 131/96 | HR 75 | Ht 65.0 in | Wt 152.0 lb

## 2012-07-16 DIAGNOSIS — F331 Major depressive disorder, recurrent, moderate: Secondary | ICD-10-CM

## 2012-07-16 DIAGNOSIS — F332 Major depressive disorder, recurrent severe without psychotic features: Secondary | ICD-10-CM

## 2012-07-16 MED ORDER — HYDROXYZINE PAMOATE 25 MG PO CAPS: 25.0000 mg | ORAL_CAPSULE | Freq: Three times a day (TID) | ORAL | Status: DC | PRN

## 2012-07-16 MED ORDER — FLUOXETINE HCL 40 MG PO CAPS: 80.0000 mg | ORAL_CAPSULE | Freq: Every day | ORAL | Status: DC
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
## 2012-08-13 ENCOUNTER — Encounter (HOSPITAL_COMMUNITY): Payer: Self-pay | Admitting: Psychiatry

## 2012-08-13 ENCOUNTER — Ambulatory Visit (INDEPENDENT_AMBULATORY_CARE_PROVIDER_SITE_OTHER): Payer: BC Managed Care – PPO | Admitting: Psychiatry

## 2012-08-13 VITALS — BP 132/78 | HR 79 | Ht 65.0 in | Wt 157.5 lb

## 2012-08-13 DIAGNOSIS — F332 Major depressive disorder, recurrent severe without psychotic features: Secondary | ICD-10-CM

## 2012-08-13 DIAGNOSIS — F331 Major depressive disorder, recurrent, moderate: Secondary | ICD-10-CM

## 2012-08-13 MED ORDER — HYDROXYZINE PAMOATE 25 MG PO CAPS: 25.0000 mg | ORAL_CAPSULE | Freq: Four times a day (QID) | ORAL | Status: DC | PRN

## 2012-08-13 MED ORDER — FLUOXETINE HCL 40 MG PO CAPS: 80.0000 mg | ORAL_CAPSULE | Freq: Every day | ORAL | Status: DC
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
## 2012-08-14 ENCOUNTER — Other Ambulatory Visit: Payer: Self-pay | Admitting: Family Medicine

## 2012-08-16 NOTE — Telephone Encounter (Signed)
Needs appointment

## 2012-08-17 ENCOUNTER — Encounter: Payer: Self-pay | Admitting: Family Medicine

## 2012-08-17 ENCOUNTER — Ambulatory Visit (INDEPENDENT_AMBULATORY_CARE_PROVIDER_SITE_OTHER): Payer: BC Managed Care – PPO | Admitting: Family Medicine

## 2012-08-17 ENCOUNTER — Telehealth (HOSPITAL_COMMUNITY): Payer: Self-pay

## 2012-08-17 VITALS — BP 126/83 | HR 71 | Temp 98.2°F | Wt 160.0 lb

## 2012-08-17 DIAGNOSIS — H5460 Unqualified visual loss, one eye, unspecified: Secondary | ICD-10-CM

## 2012-08-17 DIAGNOSIS — H5462 Unqualified visual loss, left eye, normal vision right eye: Secondary | ICD-10-CM

## 2012-08-17 NOTE — Telephone Encounter (Signed)
Called patient. She reports that she is losing her vision periodically, "like a curtain coming down" in her visual field. Advised patient call her PCP.

## 2012-08-17 NOTE — Progress Notes (Signed)
Unremarkable TTE from 03/29/12

## 2012-08-17 NOTE — Progress Notes (Signed)
CC: Darlene Ochoa is a 47 y.o. female is here for vision loss in left eye   Subjective: HPI:  Patient complains of vision loss in the left eye that occurred this morning. It was preceded by a hazy/Smokey presents in her lateral field-of-view on the left that then evolved into a slow lowering black curtain obscuring the top hemisphere of her left eye. She was resting on the couch when this occurred and prior to this she reports being in her regular state of health.  There were no interventions performed however this gradually improved over the course of 15 minutes to the point where she believes her vision is back to 100%. Nothing particularly made it better or worse. She had a similar episode in the left eye back around Thanksgiving with a workup while hospitalized including carotid Dopplers, telemetry, CBCs, BMPs, noncontrast CT of the head, and an echocardiogram (however am unable to find this in our records).  She tells me since discharge she has had no motor or sensory disturbances up until today. She denies headaches, confusion, coordination difficulty, disorientation, mental disturbance, hallucinations, nor sensation of migraine might be coming on. She reports that she hasn't had a migraine for maybe half a year.    Review of Systems - General ROS: negative for - chills, fever, night sweats, weight gain or weight loss Ophthalmic ROS: negative for - decreased vision Psychological ROS: negative for - anxiety or depression ENT ROS: negative for - hearing change, nasal congestion, tinnitus or allergies Hematological and Lymphatic ROS: negative for - bleeding problems, bruising or swollen lymph nodes Breast ROS: negative Respiratory ROS: no cough, shortness of breath, or wheezing Cardiovascular ROS: no chest pain or dyspnea on exertion Gastrointestinal ROS: no abdominal pain, change in bowel habits, or black or bloody stools Genito-Urinary ROS: negative for - genital discharge, genital ulcers,  incontinence or abnormal bleeding from genitals Musculoskeletal ROS: negative for - joint pain or muscle pain Neurological ROS: negative for - headaches or memory loss Dermatological ROS: negative for lumps, mole changes, rash and skin lesion changes    Past Medical History  Diagnosis Date  . Depression   . Seasonal allergies   . Hypertension   . Major depressive disorder 03/31/2012  . Chest pain 03/31/2012  . H/O prolonged Q-T interval on ECG 03/31/2012     Family History  Problem Relation Age of Onset  . Diabetes Mother   . Cancer Mother     Breast  . Depression Mother   . CAD Mother   . Heart failure Father   . Stroke Father   . Alcohol abuse Father   . Alcohol abuse Brother   . Drug abuse Brother      History  Substance Use Topics  . Smoking status: Never Smoker   . Smokeless tobacco: Never Used  . Alcohol Use: No     Objective: Filed Vitals:   08/17/12 1052  BP: 126/83  Pulse: 71  Temp: 98.2 F (36.8 C)    General: Alert and Oriented, No Acute Distress HEENT: Pupils equal, round, reactive to light. Conjunctivae clear.  External ears unremarkable, canals clear with intact TMs with appropriate landmarks.  Middle ear appears open without effusion. Pink inferior turbinates.  Moist mucous membranes, pharynx without inflammation nor lesions.  Neck supple without palpable lymphadenopathy nor abnormal masses. No papilledema bilaterally without abnormal funduscopic exam in unremarkable vasculature within the eye. Lungs: Clear to auscultation bilaterally, no wheezing/ronchi/rales.  Comfortable work of breathing. Good air movement. Cardiac: Regular  rate and rhythm. Normal S1/S2.  No murmurs, rubs, nor gallops.   Neuro: CN II-XII grossly intact, full strength/rom of all four extremities, C5/L4/S1 DTRs 2/4 bilaterally, gait normal, rapid alternating movements normal, heel-shin test normal, Rhomberg normal. Extremities: No peripheral edema.  Strong peripheral pulses.   Mental Status: No depression, anxiety, nor agitation. Skin: Warm and dry.  Assessment & Plan: Darlene Ochoa was seen today for vision loss in left eye.  Diagnoses and associated orders for this visit:  Vision loss of left eye - Ambulatory referral to Ophthalmology    Discussed with patient I would like her to have a dilated eye exam and we have referred her to our ophthalmology colleagues to try to get her in today.  I've strongly encouraged her to have an MRI of the brain with and without contrast to help look for demyelinating disease or evidence of a vascular event. She is reluctant about this order and would prefer to wait to find out results from ophthalmology due to financial concerns. We will help arrange followup over the phone based on today's ophthalmology findings, we will determine if an echocardiogram was in fact performed at [facility].  Return in about 2 days (around 08/19/2012).

## 2012-08-18 ENCOUNTER — Ambulatory Visit (HOSPITAL_COMMUNITY): Payer: Self-pay | Admitting: Behavioral Health

## 2012-09-14 ENCOUNTER — Encounter (HOSPITAL_COMMUNITY): Payer: Self-pay | Admitting: Psychiatry

## 2012-09-14 ENCOUNTER — Ambulatory Visit (INDEPENDENT_AMBULATORY_CARE_PROVIDER_SITE_OTHER): Payer: BC Managed Care – PPO | Admitting: Psychiatry

## 2012-09-14 VITALS — BP 120/73 | HR 75 | Ht 65.0 in | Wt 158.0 lb

## 2012-09-14 DIAGNOSIS — F331 Major depressive disorder, recurrent, moderate: Secondary | ICD-10-CM

## 2012-09-14 DIAGNOSIS — F332 Major depressive disorder, recurrent severe without psychotic features: Secondary | ICD-10-CM

## 2012-09-14 LAB — CBC WITH DIFFERENTIAL/PLATELET
Eosinophils Relative: 4 % (ref 0–5)
HCT: 38.5 % (ref 36.0–46.0)
Lymphocytes Relative: 39 % (ref 12–46)
Lymphs Abs: 2.3 10*3/uL (ref 0.7–4.0)
MCV: 86.9 fL (ref 78.0–100.0)
Monocytes Absolute: 0.4 10*3/uL (ref 0.1–1.0)
Neutro Abs: 3 10*3/uL (ref 1.7–7.7)
RBC: 4.43 MIL/uL (ref 3.87–5.11)
WBC: 5.9 10*3/uL (ref 4.0–10.5)
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
## 2012-09-15 LAB — HEMOGLOBIN A1C: Hgb A1c MFr Bld: 5.4 % (ref <5.7)

## 2012-09-15 LAB — T3, FREE: T3, Free: 3 pg/mL (ref 2.3–4.2)

## 2012-09-15 LAB — T4, FREE: Free T4: 1.18 ng/dL (ref 0.80–1.80)

## 2012-09-15 LAB — VITAMIN D 25 HYDROXY (VIT D DEFICIENCY, FRACTURES): Vit D, 25-Hydroxy: 53 ng/mL (ref 30–89)

## 2012-09-20 ENCOUNTER — Telehealth (HOSPITAL_COMMUNITY): Payer: Self-pay | Admitting: Psychiatry

## 2012-09-20 NOTE — Telephone Encounter (Signed)
Called patient. Reviewed labs. She denies any tiredness with fluoxetine. She continues to experience fatigue.  Asked her to speak to her PCP about home sleep study.  Will send this message to PCP and have asked the patient to speak to her PCP.

## 2012-09-23 ENCOUNTER — Encounter (HOSPITAL_COMMUNITY): Payer: Self-pay | Admitting: Behavioral Health

## 2012-09-23 ENCOUNTER — Ambulatory Visit (INDEPENDENT_AMBULATORY_CARE_PROVIDER_SITE_OTHER): Payer: BC Managed Care – PPO | Admitting: Behavioral Health

## 2012-09-23 DIAGNOSIS — F411 Generalized anxiety disorder: Secondary | ICD-10-CM

## 2012-09-23 DIAGNOSIS — F331 Major depressive disorder, recurrent, moderate: Secondary | ICD-10-CM
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
## 2012-10-15 ENCOUNTER — Ambulatory Visit (HOSPITAL_COMMUNITY): Payer: Self-pay | Admitting: Psychiatry

## 2012-10-21 ENCOUNTER — Ambulatory Visit (INDEPENDENT_AMBULATORY_CARE_PROVIDER_SITE_OTHER): Payer: BC Managed Care – PPO | Admitting: Psychiatry

## 2012-10-21 ENCOUNTER — Encounter (HOSPITAL_COMMUNITY): Payer: Self-pay | Admitting: Psychiatry

## 2012-10-21 VITALS — BP 121/76 | HR 75 | Ht 65.0 in | Wt 156.0 lb

## 2012-10-21 DIAGNOSIS — F332 Major depressive disorder, recurrent severe without psychotic features: Secondary | ICD-10-CM

## 2012-10-21 DIAGNOSIS — F331 Major depressive disorder, recurrent, moderate: Secondary | ICD-10-CM

## 2012-10-21 MED ORDER — FLUOXETINE HCL 40 MG PO CAPS: 80.0000 mg | ORAL_CAPSULE | Freq: Every day | ORAL | Status: DC

## 2012-10-21 MED ORDER — HYDROXYZINE PAMOATE 25 MG PO CAPS: 25.0000 mg | ORAL_CAPSULE | Freq: Four times a day (QID) | ORAL | Status: DC | PRN
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
## 2012-11-03 ENCOUNTER — Ambulatory Visit (INDEPENDENT_AMBULATORY_CARE_PROVIDER_SITE_OTHER): Payer: BC Managed Care – PPO | Admitting: Behavioral Health

## 2012-11-03 ENCOUNTER — Encounter (HOSPITAL_COMMUNITY): Payer: Self-pay | Admitting: Behavioral Health

## 2012-11-03 DIAGNOSIS — F331 Major depressive disorder, recurrent, moderate: Secondary | ICD-10-CM

## 2012-11-03 NOTE — Progress Notes (Signed)
   THERAPIST PROGRESS NOTE  Session Time: 8:00  Participation Level: Active  Behavioral Response: CasualAlertDepressed  Type of Therapy: Individual Therapy  Treatment Goals addressed: Coping  Interventions: CBT  Summary: Darlene Ochoa is a 46 y.o. female who presents with depression.   Suicidal/Homicidal: Nowithout intent/plan  Therapist Response: The client stated that she knows she has made some progress in terms of anxiety reduction and setting limits with family members. She indicated that she has done a much better job of limiting how often and how long she speaks to her mother but said that recently and a conversation she started having what she described as a panic attack where she started feeling like the room was spinning around her. She stated that she told her mother she would have to call her back and her husband helped her settled down. She indicates that rarely gets to that point and conversations with her mother. We did review some anxiety coping skills and I gave her a CD which includes relaxation breathing and progressive muscle relaxation.  We addressed two specific areas in her life which appeared to be feeding her anxiety. She indicated that since being in the hospital in December her house has been cluttered. She stated that it's not dirty but that there is things all over the house that she would like to see put in their proper place. She indicates that she does not get any help from her children that she should go she has always done that for them. He talked about compartmentalizing the house into one room at bedtime so that it does not become overwhelming. We talked about meeting with her children individually or having a family meeting telling them that there needed to be complete family participation in reducing the clutter and disorganization. We talked about what consequences would be if her family members did not participate in keeping certain areas of the house clean  including the living room. We suggested starting with that room as a way of changing the culture or the months that in the home. She also talked about how her older children are practical joker's and also like to irritate 32 year old daughter because they know that will cause her to argue. We talked about sitting down with the older children and telling them that although she thinks it's funny and she loves her sense of humor at times their mischief and practical jokes including scaring the client R. creating to her anxiety and that needs to be held to a minimal to moderate level. The client also asked that hold her accountable saying that as a child and even with her husband she has always for the most part but allowed to do what she wanted to do and that makes structure and discipline difficult for her at times. She asked that if I told her accountable to please do that gently. I promised her that I would.   Plan: Return again in 4 weeks.  Diagnosis: Axis I: 296.32    Axis II: Deferred    [name], [facility] 11/03/2012

## 2012-11-17 ENCOUNTER — Ambulatory Visit (HOSPITAL_COMMUNITY): Payer: Self-pay | Admitting: Behavioral Health

## 2012-11-19 ENCOUNTER — Other Ambulatory Visit: Payer: Self-pay | Admitting: Family Medicine

## 2012-11-19 NOTE — Telephone Encounter (Signed)
Needs f/u appt before future refills 

## 2012-12-01 ENCOUNTER — Ambulatory Visit (INDEPENDENT_AMBULATORY_CARE_PROVIDER_SITE_OTHER): Payer: BC Managed Care – PPO | Admitting: Behavioral Health

## 2012-12-01 ENCOUNTER — Encounter (HOSPITAL_COMMUNITY): Payer: Self-pay | Admitting: Behavioral Health

## 2012-12-01 DIAGNOSIS — F411 Generalized anxiety disorder: Secondary | ICD-10-CM

## 2012-12-01 DIAGNOSIS — F331 Major depressive disorder, recurrent, moderate: Secondary | ICD-10-CM

## 2012-12-01 NOTE — Progress Notes (Signed)
   THERAPIST PROGRESS NOTE  Session Time: 10:00  Participation Level: Active  Behavioral Response: CasualAlertAnxious/depressed  Type of Therapy: Individual Therapy  Treatment Goals addressed: Coping  Interventions: CBT  Summary: Darlene Ochoa is a 47 y.o. female who presents with depression and anxiety.   Suicidal/Homicidal: Nowithout intent/plan  Therapist Response: With the client who indicated there had been some major changes. She initially said that she is tired throughout the day and often has to take a nap in the middle afternoon. She questioned whether it could be the blood pressure medication, depression, or a combination of both. I did encourage her to go see Dr. Kary Kos. I did speak to him later in the day he indicated that he had already met with her and they did change her depression medication.  The client has done much better job of setting limits with her family. She did say that she and her husband had a family meeting after her last session and that her children have been much more helpful in contribute to upkeep of the house which had taken some of her anxiety away.. She did say that her latest anxiety stems from the fact that her mother may be coming to live with the family which she has started to prepare for. The mother currently lives in [place] and is in the beginning stages of dementia. The clients and helps take care of the mother and called both client and her brother her saying that she could no longer care for her it would need that the client to come get her. The client indicates that the aunt has done this before so she is not sure how serious this is. She indicated that she has started to make preparations for if the mother does come to pull have a more definitive conversation with the aunt tonight. We talked about the clients feelings associated with the mother possibly moving in. She expresses mixed emotions knowing that she wants to take care of her mother but  knows that she has to set boundaries with her mother do to the anxiety that relationship at times grades. She referred back to phone conversation that she admits in the previous session realizing that the mother sound like her old self and feeling that the mother may have been deceptive or manipulative in other conversations leading her to think her dementia is worse than it is. We processed different scenarios if the mother does move into the home and the conversations that will need to take place up front in terms of setting boundaries physically, mentally and emotionally. The client did say that she is listen to the relaxation CD in the car that was helpful but she knows that she needs to listen to it at home. She did say her anxiety is better but feels her depression has not improved. I look forward to seeing if the change in antidepressant makes a difference. We did review coping skills for depression. She is exercising more and eating better more but indicated that with the news that her mother might become that she has gotten off track somewhat with those patterns. I encouraged her to regain you those. She reports that her husband and children continued to be supportive as doesn't neighbor. She does contract for safety saying she is not thinking of hurting herself or anyone else.  Plan: Return again in And 2 weeks.  Diagnosis: Axis I: 296.32    Axis II: Deferred    Darlene Ochoa, [facility] 12/01/2012

## 2012-12-09 ENCOUNTER — Ambulatory Visit (INDEPENDENT_AMBULATORY_CARE_PROVIDER_SITE_OTHER): Payer: BC Managed Care – PPO | Admitting: Behavioral Health

## 2012-12-09 ENCOUNTER — Encounter (HOSPITAL_COMMUNITY): Payer: Self-pay | Admitting: Behavioral Health

## 2012-12-09 DIAGNOSIS — F411 Generalized anxiety disorder: Secondary | ICD-10-CM

## 2012-12-09 DIAGNOSIS — F331 Major depressive disorder, recurrent, moderate: Secondary | ICD-10-CM

## 2012-12-09 NOTE — Progress Notes (Signed)
   THERAPIST PROGRESS NOTE  Session Time: 8:00  Participation Level: Active  Behavioral Response: CasualAlertAnxious  Type of Therapy: Individual Therapy  Treatment Goals addressed: Coping  Interventions: CBT  Summary: Darlene Ochoa is a 47 y.o. female who presents with anxiety and depression.   Suicidal/Homicidal: Nowithout intent/plan  Therapist Response: The client had made several significant positive steps over the past 2 weeks and setting boundaries. She stated that she had originally decided that he would not be best if her mother came to live with her. She indicated the freedom encouraged that came with that decision something she had not experienced a long time. She ultimately he did decide to allow her mother to live with her family and now is at peace with that decision. 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 She also indicated that she set a boundary with her husband which he may not even have been aware of in terms of not feeling guilty about something that she did not get done because she was doing something else to took more precedence.  The client continues to say that she feels physically fatigued more than she would like to. She stated that she has not called Dr. Kary Kos because of getting her kids ready to go back to school in getting the bedroom ready for her mother but she will do so in the next few days. I did tell her that spoken to him after her last session. She reports that she may feel good and she gets up and will go for feel strongly the need to lay down and take a nap B's he feels good after she takes at. She just does not like he roller coaster of feeling good for short period of time, being tired, resting, feeling good again. We did talk about changing dietary habits including eating smaller meals throughout the day as  opposed to large meals, and cutting back on the sweetened iced tea that she drinks. The client does contract for safety saying that her anxiety level is down significantly over the past few weeks but especially over the past 6 or 8 months. She still does report some mild to moderate depression but indicates that is manageable. She does question if she is sleeping well since she feels tired during the day but says that she is sleeping through the night and is not aware that she is waking up.   Plan: Return again in 3 weeks.  Diagnosis: Axis I: 300.02/296.32    Axis II: Deferred    [name], [facility] 12/09/2012

## 2012-12-20 ENCOUNTER — Other Ambulatory Visit: Payer: Self-pay | Admitting: Family Medicine

## 2012-12-22 ENCOUNTER — Encounter (HOSPITAL_COMMUNITY): Payer: Self-pay | Admitting: Psychiatry

## 2012-12-22 ENCOUNTER — Ambulatory Visit (INDEPENDENT_AMBULATORY_CARE_PROVIDER_SITE_OTHER): Payer: BC Managed Care – PPO | Admitting: Psychiatry

## 2012-12-22 VITALS — BP 107/78 | HR 76 | Ht 65.0 in | Wt 152.0 lb

## 2012-12-22 DIAGNOSIS — F331 Major depressive disorder, recurrent, moderate: Secondary | ICD-10-CM

## 2012-12-22 DIAGNOSIS — F411 Generalized anxiety disorder: Secondary | ICD-10-CM

## 2012-12-22 MED ORDER — FLUOXETINE HCL 40 MG PO CAPS: 80.0000 mg | ORAL_CAPSULE | Freq: Every day | ORAL | Status: DC

## 2012-12-22 MED ORDER — HYDROXYZINE PAMOATE 25 MG PO CAPS: 25.0000 mg | ORAL_CAPSULE | Freq: Four times a day (QID) | ORAL | Status: DC | PRN
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
## 2012-12-23 ENCOUNTER — Ambulatory Visit (INDEPENDENT_AMBULATORY_CARE_PROVIDER_SITE_OTHER): Payer: BC Managed Care – PPO | Admitting: Behavioral Health

## 2012-12-23 ENCOUNTER — Encounter (HOSPITAL_COMMUNITY): Payer: Self-pay | Admitting: Behavioral Health

## 2012-12-23 DIAGNOSIS — F331 Major depressive disorder, recurrent, moderate: Secondary | ICD-10-CM

## 2012-12-23 DIAGNOSIS — F411 Generalized anxiety disorder: Secondary | ICD-10-CM
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
## 2012-12-27 ENCOUNTER — Other Ambulatory Visit: Payer: Self-pay | Admitting: Family Medicine

## 2012-12-27 DIAGNOSIS — F411 Generalized anxiety disorder: Secondary | ICD-10-CM | POA: Insufficient documentation

## 2012-12-27 DIAGNOSIS — F331 Major depressive disorder, recurrent, moderate: Secondary | ICD-10-CM | POA: Insufficient documentation

## 2013-01-05 ENCOUNTER — Ambulatory Visit (INDEPENDENT_AMBULATORY_CARE_PROVIDER_SITE_OTHER): Payer: BC Managed Care – PPO | Admitting: Family Medicine

## 2013-01-05 ENCOUNTER — Ambulatory Visit (INDEPENDENT_AMBULATORY_CARE_PROVIDER_SITE_OTHER): Payer: BC Managed Care – PPO | Admitting: Behavioral Health

## 2013-01-05 ENCOUNTER — Encounter: Payer: Self-pay | Admitting: Family Medicine

## 2013-01-05 ENCOUNTER — Encounter (HOSPITAL_COMMUNITY): Payer: Self-pay | Admitting: Behavioral Health

## 2013-01-05 VITALS — BP 135/90 | HR 80 | Temp 98.3°F | Wt 155.0 lb

## 2013-01-05 DIAGNOSIS — B9689 Other specified bacterial agents as the cause of diseases classified elsewhere: Secondary | ICD-10-CM

## 2013-01-05 DIAGNOSIS — J329 Chronic sinusitis, unspecified: Secondary | ICD-10-CM

## 2013-01-05 DIAGNOSIS — A499 Bacterial infection, unspecified: Secondary | ICD-10-CM

## 2013-01-05 DIAGNOSIS — F411 Generalized anxiety disorder: Secondary | ICD-10-CM

## 2013-01-05 MED ORDER — LEVOFLOXACIN 500 MG PO TABS: 500.0000 mg | ORAL_TABLET | Freq: Every day | ORAL | Status: DC
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
## 2013-01-05 NOTE — Progress Notes (Signed)
CC: Darlene Ochoa is a 47 y.o. female is here for Nasal Congestion   Subjective: HPI:  Patient complains of moderate nasal congestion with facial pressure localized below the eyes has been present for 2 days worsening on a daily basis present all hours the day. Nothing particularly makes better or worse no interventions as of yet symptoms began after cutting dry wall in her bathroom. Associated with swollen glands neck sore throat. Denies cough, fever, chills, shortness of breath, vision loss, motor sensory disturbances nor chest discomfort   Review Of Systems Outlined In HPI  Past Medical History  Diagnosis Date  . Depression   . Seasonal allergies   . Hypertension   . Major depressive disorder 03/31/2012  . Chest pain 03/31/2012  . H/O prolonged Q-T interval on ECG 03/31/2012  . Anxiety      Family History  Problem Relation Age of Onset  . Diabetes Mother   . Cancer Mother     Breast  . Depression Mother   . CAD Mother   . Heart failure Father   . Stroke Father   . Alcohol abuse Father   . Alcohol abuse Brother   . Drug abuse Brother      History  Substance Use Topics  . Smoking status: Never Smoker   . Smokeless tobacco: Never Used  . Alcohol Use: No     Objective: Filed Vitals:   01/05/13 1008  BP: 135/90  Pulse: 80  Temp: 98.3 F (36.8 C)    General: Alert and Oriented, No Acute Distress HEENT: Pupils equal, round, reactive to light. Conjunctivae clear.  External ears unremarkable, canals clear with intact TMs with appropriate landmarks.  Middle ear appears open without effusion. Boggy erythematous inferior turbinates of moderate mucoid discharge.  Moist mucous membranes, pharynx without inflammation nor lesions.  Shotty anterior chain lymphadenopathy bilaterally uvula is midline. Maxillary sinus tenderness to percussion bilaterally Lungs: Clear to auscultation bilaterally, no wheezing/ronchi/rales.  Comfortable work of breathing. Good air movement. Cardiac:  Regular rate and rhythm. Normal S1/S2.  No murmurs, rubs, nor gallops.   Mental Status: No depression, anxiety, nor agitation. Skin: Warm and dry.  Assessment & Plan: Kambrey was seen today for nasal congestion.  Diagnoses and associated orders for this visit:  Bacterial sinusitis - levofloxacin (LEVAQUIN) 500 MG tablet; Take 1 tablet (500 mg total) by mouth daily.    Encourage patient to start symptomatic care including Mucinex, nasal saline washes continue Claritin, if symptoms persist beyond 7 days would be wise to start antibiotics. Allergies complicate treatment options, she has a history of prolonged QTC over the last year old EKGs had QTC less than 460 she has tolerated Levaquin in the past which will be an option for her however we did discuss risks and benefits with respect to cardiac arrhythmia  Return if symptoms worsen or fail to improve.

## 2013-01-24 ENCOUNTER — Encounter (HOSPITAL_COMMUNITY): Payer: Self-pay | Admitting: Behavioral Health

## 2013-01-24 ENCOUNTER — Ambulatory Visit (INDEPENDENT_AMBULATORY_CARE_PROVIDER_SITE_OTHER): Payer: BC Managed Care – PPO | Admitting: Behavioral Health

## 2013-01-24 DIAGNOSIS — F331 Major depressive disorder, recurrent, moderate: Secondary | ICD-10-CM

## 2013-01-24 DIAGNOSIS — F411 Generalized anxiety disorder: Secondary | ICD-10-CM

## 2013-01-24 NOTE — Progress Notes (Signed)
   THERAPIST PROGRESS NOTE  Session Time: 10:00  Participation Level: Active  Behavioral Response: CasualAlertDepressed  Type of Therapy: Individual Therapy  Treatment Goals addressed: Coping  Interventions: CBT  Summary: Darlene Ochoa is a 47 y.o. female who presents with depression.   Suicidal/Homicidal: Nowithout intent/plan  Therapist Response: The client indicated that the move of her mother from [place] into her home has gone fairly smoothly. She did say that there are certainly adjustments that both her family and her mother has to make but most of those have been minimal so far. The client indicates that she is trying to get a good read for where her mother is. She is not convinced that her mother is in the beginning stages of dementia but says that she has noticed where the mother has at times forgotten something. She has taken the mother to the medical Dr. just to get her established with one here but will start taking a journal of when her mother does seem to have forgetful spells. She indicates that her mother does have some physical health issues but for the most part is okay and the client questions when she should in which she should or should not response to her mother's physical health issues. The client did indicate that most of the adjustment issues have been minimal and she is working through as to how to address them. We talked about how she can be assertive and addressing those with her mother. We also talked at length about taking care of herself with the changes that have taken place. She is creating a quiet space for herself in the basement which she says she will be more than comfortable in using knowing their mother can take care of herself for a little while.  Plan: Return again in 3 weeks.  Diagnosis: Axis I: 296.32    Axis II: Deferred    Dorn Hartshorne M, LPC 01/24/2013

## 2013-02-16 ENCOUNTER — Encounter (HOSPITAL_COMMUNITY): Payer: Self-pay | Admitting: Behavioral Health

## 2013-02-16 ENCOUNTER — Ambulatory Visit (INDEPENDENT_AMBULATORY_CARE_PROVIDER_SITE_OTHER): Payer: BC Managed Care – PPO | Admitting: Behavioral Health

## 2013-02-16 DIAGNOSIS — F331 Major depressive disorder, recurrent, moderate: Secondary | ICD-10-CM

## 2013-02-16 DIAGNOSIS — F411 Generalized anxiety disorder: Secondary | ICD-10-CM

## 2013-02-16 NOTE — Progress Notes (Signed)
   THERAPIST PROGRESS NOTE  Session Time: 9:00  Participation Level: Active  Behavioral Response: NeatAlertAnxious  Type of Therapy: Individual Therapy  Treatment Goals addressed: Coping  Interventions: CBT  Summary: Darlene Ochoa is a 47 y.o. female who presents with anxiety and depression.   Suicidal/Homicidal: Nowithout intent/plan  Therapist Response: The client presented with anxiety today. She indicated that in the last session her mother had just moved into the house and things are going smoothly but since that time her mother has created significant stress and anxiety for her. She states that her mother does nothing to help around the house and has been verbally disrespectful to the patient. The patient said that the anxiety and frustration starting to affect her physically saying that she feels it in her chest. She says that it is not to the level that e to the hospital one year ago but she has concerns that if his behavior continues and will become somewhat overwhelming for her.  We stressed the importance of a quiet place in the house, the basement, to be a place of peace and rest for the client. The client assures me that the mother can take care of herself if she is home so I encouraged the client to go out and walk or go to a store just to get out of the house for a few minutes leaving herself and number for her mother if she needs her but reassure herself that her mother can take care of herself. Most importantly I encouraged her to join forces with her husband and to sit down and have an assertive conversation with her mother about her expectations of her mother living in the house. It appears as if the clients frustration and irritation and anxiety stem primarily from feeling disrespected by her mother. We talked about buzz words that she could use in a conversation with her mom with her husband present she did assertively communicate a message to her mother. Also encouraged her to  remember to listen to the relaxation CD and to do other things to bring relaxation to her. The client reports that she is medication compliant and does contract for safety saying she has no thoughts of hurting herself or anyone else. We agreed to attempt to schedule the next session within 2 weeks.  Plan: Return again in 2 weeks.  Diagnosis: Axis I: 296.32/300.02    Axis II: Deferred    [name], [facility] 02/16/2013

## 2013-02-28 ENCOUNTER — Other Ambulatory Visit: Payer: Self-pay | Admitting: Family Medicine

## 2013-03-02 ENCOUNTER — Ambulatory Visit (HOSPITAL_COMMUNITY): Payer: BC Managed Care – PPO | Admitting: Behavioral Health

## 2013-03-02 ENCOUNTER — Telehealth (HOSPITAL_COMMUNITY): Payer: Self-pay | Admitting: Behavioral Health

## 2013-03-02 ENCOUNTER — Telehealth: Payer: Self-pay | Admitting: Family Medicine

## 2013-03-02 DIAGNOSIS — R5383 Other fatigue: Secondary | ICD-10-CM

## 2013-03-02 NOTE — Telephone Encounter (Signed)
Fatigue, nonrestorative sleep, possibly stress from mom being home? Rule out medical etiology.

## 2013-03-02 NOTE — Telephone Encounter (Signed)
The client was scheduled for a 9:00 appointment today. She does not typically miss her appointments. I called to numbers listed in her demographics but neither indicated that they were her sought did not leave a message. There is a release signed for me to speak to her husband so called her husband some of her informing him that she missed in asking him to please check in and to make another appointment with her. He seemed to indicate everything was okay as far as he knew.

## 2013-03-16 ENCOUNTER — Encounter (HOSPITAL_COMMUNITY): Payer: Self-pay | Admitting: Behavioral Health

## 2013-03-16 ENCOUNTER — Encounter (INDEPENDENT_AMBULATORY_CARE_PROVIDER_SITE_OTHER): Payer: Self-pay

## 2013-03-16 ENCOUNTER — Ambulatory Visit (INDEPENDENT_AMBULATORY_CARE_PROVIDER_SITE_OTHER): Payer: BC Managed Care – PPO | Admitting: Behavioral Health

## 2013-03-16 DIAGNOSIS — F331 Major depressive disorder, recurrent, moderate: Secondary | ICD-10-CM
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
## 2013-03-23 ENCOUNTER — Encounter (HOSPITAL_COMMUNITY): Payer: Self-pay | Admitting: Psychiatry

## 2013-03-23 ENCOUNTER — Ambulatory Visit (INDEPENDENT_AMBULATORY_CARE_PROVIDER_SITE_OTHER): Payer: BC Managed Care – PPO | Admitting: Psychiatry

## 2013-03-23 VITALS — BP 118/84 | HR 80 | Ht 65.0 in | Wt 162.0 lb

## 2013-03-23 DIAGNOSIS — F411 Generalized anxiety disorder: Secondary | ICD-10-CM

## 2013-03-23 DIAGNOSIS — F331 Major depressive disorder, recurrent, moderate: Secondary | ICD-10-CM

## 2013-03-23 MED ORDER — HYDROXYZINE PAMOATE 25 MG PO CAPS: 25.0000 mg | ORAL_CAPSULE | Freq: Four times a day (QID) | ORAL | Status: DC | PRN

## 2013-03-23 MED ORDER — FLUOXETINE HCL 40 MG PO CAPS: 80.0000 mg | ORAL_CAPSULE | Freq: Every day | ORAL | Status: DC
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
## 2013-03-30 ENCOUNTER — Ambulatory Visit (INDEPENDENT_AMBULATORY_CARE_PROVIDER_SITE_OTHER): Payer: BC Managed Care – PPO | Admitting: Behavioral Health

## 2013-03-30 ENCOUNTER — Encounter (HOSPITAL_COMMUNITY): Payer: Self-pay | Admitting: Behavioral Health

## 2013-03-30 DIAGNOSIS — F331 Major depressive disorder, recurrent, moderate: Secondary | ICD-10-CM

## 2013-03-30 NOTE — Progress Notes (Signed)
   THERAPIST PROGRESS NOTE  Session Time: 9:00  Participation Level: Active  Behavioral Response: NeatAlertDepressed/anxious  Type of Therapy: Individual Therapy  Treatment Goals addressed: Coping  Interventions: CBT  Summary: Darlene Ochoa is a 47 y.o. female who presents with depression and anxiety.   Suicidal/Homicidal: Nowithout intent/plan  Therapist Response: The client presents with significant anxiety related to her mother being in the home. She indicates that her mother is either intentionally not taking her medication or is forgetting to take her medication. The client indicated that her mother is exhibiting what she sees as all the symptoms of dementia. She did say that the mothers Dr. finally has all of her records from her previous doctor and they're checking out all avenues. She expressed to the mother's doctor what was going on and feels very supportive by the mother's medical Dr. Client indicates that her stomach stays in knots" and she has become so stressed or anxious that she has vomited. We talked about ways the client to continue to set boundaries. She indicates that she is setting boundaries in terms of not repeating things to her mother or her mother ignored her. She indicates that she has become more fearful of leaving home because she is not sure if her mother to hurt herself in voluntarily to talk about safety measure she could put in place in the home. The client has decided for the first time she recognizes that the mother needs to have more care than the client can give her. She indicates that she wait until all test results come back into doctor's recommendation for how to best put her mother in a good situation in terms of being cared for.  The client is in her anxiety levels up but that  It is not as bad as it has been in the past. She does contract for safety saying she has no thoughts of hurting herself or anyone else.  Plan: Return again in 2  weeks.  Diagnosis: Axis I: 296.32    Axis II: Deferred    [name], [facility] 03/30/2013

## 2013-04-04 ENCOUNTER — Other Ambulatory Visit: Payer: Self-pay | Admitting: Family Medicine

## 2013-04-07 ENCOUNTER — Ambulatory Visit (INDEPENDENT_AMBULATORY_CARE_PROVIDER_SITE_OTHER): Payer: BC Managed Care – PPO | Admitting: Family Medicine

## 2013-04-07 ENCOUNTER — Encounter: Payer: Self-pay | Admitting: Family Medicine

## 2013-04-07 VITALS — BP 126/83 | HR 91 | Wt 164.0 lb

## 2013-04-07 DIAGNOSIS — F411 Generalized anxiety disorder: Secondary | ICD-10-CM

## 2013-04-07 DIAGNOSIS — F419 Anxiety disorder, unspecified: Secondary | ICD-10-CM

## 2013-04-07 NOTE — Progress Notes (Signed)
CC: Darlene Ochoa is a 47 y.o. female is here for No chief complaint on file.   Subjective: HPI:  Patient complains that she feels that she may be reaching a breaking point with anxiety that has been brought on ever since her mother moved into their household. Symptoms have been worsening over the last 9 weeks on a weekly basis now moderate to severe in severity, she describes symptoms as anxiety and nervousness with a large component of frustration. Symptoms are worse with mother's forgetfulness and bitter personality to family members only to be cheerful and friendly to outsiders.  Symptoms are also worsened by patient's worry and guilt when she is not currently taking care of her mother given fear that her mother will put herself in a harmful situation.  Relationship is complicated by mild dementia suffered by the mother.  There has been no physical Confrontation however the 2 to get a vocal about the frustrations shared between the 2 of them.  Patient has been thinking about having the mother relocated to a different physical location however she is fearful that this is going to be seen by mean and inappropriate by others.  Patient believes her mother's health has been dwindling especially from a memory standpoint due to lack of social interaction   Review Of Systems Outlined In HPI  Past Medical History  Diagnosis Date  . Depression   . Seasonal allergies   . Hypertension   . Major depressive disorder 03/31/2012  . Chest pain 03/31/2012  . H/O prolonged Q-T interval on ECG 03/31/2012  . Anxiety      Family History  Problem Relation Age of Onset  . Diabetes Mother   . Cancer Mother     Breast  . Depression Mother   . CAD Mother   . Heart failure Father   . Stroke Father   . Alcohol abuse Father   . Alcohol abuse Brother   . Drug abuse Brother      History  Substance Use Topics  . Smoking status: Never Smoker   . Smokeless tobacco: Never Used  . Alcohol Use: No      Objective: Filed Vitals:   04/07/13 0833  BP: 126/83  Pulse: 91    Vital signs reviewed. General: Alert and Oriented, No Acute Distress HEENT: Pupils equal, round, reactive to light. Conjunctivae clear.  External ears unremarkable.  Moist mucous membranes. Lungs: Clear and comfortable work of breathing, speaking in full sentences without accessory muscle use. Cardiac: Regular rate and rhythm.  Neuro: CN II-XII grossly intact, gait normal. Extremities: No peripheral edema.  Strong peripheral pulses.  Mental Status: No depression, nor agitation. Logical though process. Moderate frustration Skin: Warm and dry.  Assessment & Plan: Kahlani was seen today for no specified reason.  Diagnoses and associated orders for this visit:  Anxiety    Discussed with patient that I did not feel her motives were inappropriate and that knowing her mother I think it would be best for both parties if she moved into a assisted living center. Patient will begin to look into such opportunities, we will pose these options to the patient's mother as if I was the one that research these opportunities because both of [place] know that the mother will reject any proposal brought up by the patient. I've also encouraged him to look into senior social centers to help with socialization and allowing the patient some time away from directly caring for her mother. Patient is asking if there is a  medication that can help with her frustration and anxiety, offered to notify her psychiatrist about this as I am not managing her psychiatric medications  45 minutes spent face-to-face during visit today of which at least 50% was counseling or coordinating care regarding anxiety.    Return if symptoms worsen or fail to improve.

## 2013-04-08 ENCOUNTER — Telehealth: Payer: Self-pay | Admitting: Family Medicine

## 2013-04-08 DIAGNOSIS — F331 Major depressive disorder, recurrent, moderate: Secondary | ICD-10-CM

## 2013-04-08 MED ORDER — HYDROXYZINE PAMOATE 50 MG PO CAPS: 50.0000 mg | ORAL_CAPSULE | Freq: Four times a day (QID) | ORAL | Status: DC | PRN

## 2013-04-08 NOTE — Telephone Encounter (Signed)
[name], Will you please let Mrs. Crymes know that Dr. Demetrius Charity has recommended increasing her as needed hydroxyzine so I've sent a new Rx to her [facility].  He also supports her intent to find a separate living location for her mother.

## 2013-04-08 NOTE — Telephone Encounter (Signed)
Left message on vm

## 2013-04-12 ENCOUNTER — Ambulatory Visit (INDEPENDENT_AMBULATORY_CARE_PROVIDER_SITE_OTHER): Payer: BC Managed Care – PPO | Admitting: Behavioral Health

## 2013-04-12 ENCOUNTER — Encounter (HOSPITAL_COMMUNITY): Payer: Self-pay | Admitting: Behavioral Health

## 2013-04-12 DIAGNOSIS — F4323 Adjustment disorder with mixed anxiety and depressed mood: Secondary | ICD-10-CM

## 2013-04-12 NOTE — Progress Notes (Signed)
   THERAPIST PROGRESS NOTE  Session Time: 9:00  Participation Level: Active  Behavioral Response: CasualAlertAnxious  Type of Therapy: Individual Therapy  Treatment Goals addressed: Coping  Interventions: CBT  Summary: Darlene Ochoa is a 47 y.o. female who presents with anxiety.   Suicidal/Homicidal: Nowithout intent/plan  Therapist Response: The client presents with significant anxiety related to stress from her mother living in her home. She indicates that she feels that she is now gone to the point where she is recognizing that she cannot emotionally Haro with her mother living in the home. She indicates that her mother is constantly saying negative things to her and to her family. She reports that her mother will not take her medication even though she asked for and the client gets out daily. She indicates that her mother is isolating more into her bedroom and not interacting with the family except at mealtime. The clients brother will be coming to her house for Christmas and her attention is at a conversation about the next step to take for the clients mother's best to care. She recognizes that physically looking for an assisted living facility will be best for her mom but that emotionally and mentally having her mom in the home is beginning to take a toll she and her family and that she has to protect them first. The client has begun to Dileonardo with the guilt the came from feeling like she was not doing the best thing for her mother. As in process that she recognize that for the past 4 months she has made significant efforts to make her mother feel a part of her home and feels that her mother has resisted that makes her feel bad about things that she has done to help. We talked at length about setting boundaries, controlling what she can he cannot control, and getting the best help for her mother which in turn will be helpful for the client and her family.  Plan: Return again in 2  weeks.  Diagnosis: Axis I: 309.28    Axis II: Deferred    [name], [facility] 04/12/2013

## 2013-04-18 ENCOUNTER — Encounter: Payer: Self-pay | Admitting: Family Medicine

## 2013-04-18 ENCOUNTER — Ambulatory Visit (INDEPENDENT_AMBULATORY_CARE_PROVIDER_SITE_OTHER): Payer: BC Managed Care – PPO | Admitting: Family Medicine

## 2013-04-18 VITALS — BP 132/80 | HR 89 | Temp 98.3°F | Wt 163.0 lb

## 2013-04-18 DIAGNOSIS — B9689 Other specified bacterial agents as the cause of diseases classified elsewhere: Secondary | ICD-10-CM

## 2013-04-18 DIAGNOSIS — A499 Bacterial infection, unspecified: Secondary | ICD-10-CM

## 2013-04-18 DIAGNOSIS — J329 Chronic sinusitis, unspecified: Secondary | ICD-10-CM

## 2013-04-18 MED ORDER — LEVOFLOXACIN 500 MG PO TABS: 500.0000 mg | ORAL_TABLET | Freq: Every day | ORAL | Status: DC

## 2013-04-18 NOTE — Progress Notes (Signed)
CC: Darlene Ochoa is a 47 y.o. female is here for Nasal Congestion and Allergies   Subjective: HPI:  Complains of nasal congestion and pressure below her eyes bilaterally it has been present for a little over a week significantly worsened to moderate severity on Friday. Symptoms are worsened by working in dusty environments. Improves with pseudoephedrine however this causes elevation in blood pressure. No other interventions as of yet. Continues on Zyrtec, reports intolerance to nasal steroids.  Symptoms are present all hours of the day accompanied by subjective fever.  Denies productive cough, wheezing, shortness of breath, motor sensory disturbances   Review Of Systems Outlined In HPI  Past Medical History  Diagnosis Date  . Depression   . Seasonal allergies   . Hypertension   . Major depressive disorder 03/31/2012  . Chest pain 03/31/2012  . H/O prolonged Q-T interval on ECG 03/31/2012  . Anxiety      Family History  Problem Relation Age of Onset  . Diabetes Mother   . Cancer Mother     Breast  . Depression Mother   . CAD Mother   . Heart failure Father   . Stroke Father   . Alcohol abuse Father   . Alcohol abuse Brother   . Drug abuse Brother      History  Substance Use Topics  . Smoking status: Never Smoker   . Smokeless tobacco: Never Used  . Alcohol Use: No     Objective: Filed Vitals:   04/18/13 0826  BP: 132/80  Pulse: 89  Temp: 98.3 F (36.8 C)    General: Alert and Oriented, No Acute Distress HEENT: Pupils equal, round, reactive to light. Conjunctivae clear.  External ears unremarkable, canals clear with intact TMs with appropriate landmarks.  Middle ear appears open without effusion. Boggy erythematous inferior turbinates.  Moist mucous membranes, pharynx without inflammation nor lesions.  Neck supple without palpable lymphadenopathy nor abnormal masses. Maxillary sinus tenderness to percussion bilaterally Lungs: Clear to auscultation bilaterally, no  wheezing/ronchi/rales.  Comfortable work of breathing. Good air movement. Neuro: Cranial nerves II through XII grossly intact Mental Status: No depression, anxiety, nor agitation. Skin: Warm and dry.  Assessment & Plan: Darlene Ochoa was seen today for nasal congestion and allergies.  Diagnoses and associated orders for this visit:  Bacterial sinusitis - levofloxacin (LEVAQUIN) 500 MG tablet; Take 1 tablet (500 mg total) by mouth daily.    Bacterial sinusitis: Start Levaquin also encouraged her to use nasal saline washes 3 times a day as needed  Return if symptoms worsen or fail to improve.

## 2013-04-26 ENCOUNTER — Encounter (HOSPITAL_COMMUNITY): Payer: Self-pay | Admitting: Behavioral Health

## 2013-04-26 ENCOUNTER — Encounter (INDEPENDENT_AMBULATORY_CARE_PROVIDER_SITE_OTHER): Payer: Self-pay

## 2013-04-26 ENCOUNTER — Ambulatory Visit (INDEPENDENT_AMBULATORY_CARE_PROVIDER_SITE_OTHER): Payer: BC Managed Care – PPO | Admitting: Behavioral Health

## 2013-04-26 DIAGNOSIS — F411 Generalized anxiety disorder: Secondary | ICD-10-CM
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
## 2013-05-03 ENCOUNTER — Encounter (INDEPENDENT_AMBULATORY_CARE_PROVIDER_SITE_OTHER): Payer: Self-pay

## 2013-05-03 ENCOUNTER — Encounter (HOSPITAL_COMMUNITY): Payer: Self-pay | Admitting: Behavioral Health

## 2013-05-03 ENCOUNTER — Ambulatory Visit (INDEPENDENT_AMBULATORY_CARE_PROVIDER_SITE_OTHER): Payer: BC Managed Care – PPO | Admitting: Behavioral Health

## 2013-05-03 DIAGNOSIS — F331 Major depressive disorder, recurrent, moderate: Secondary | ICD-10-CM

## 2013-05-03 NOTE — Progress Notes (Signed)
   THERAPIST PROGRESS NOTE  Session Time: 9:00  Participation Level: Active  Behavioral Response: NeatAlertDepressed/anxious  Type of Therapy: Individual Therapy  Treatment Goals addressed: Coping  Interventions: CBT  Summary: Darlene Ochoa is a 48 y.o. female who presents with depression.   Suicidal/Homicidal: Nowithout intent/plan  Therapist Response: The client presents today with moderate depression and moderate to severe anxiety. Most of the continues to be driven by her relationship with her biological mother who is living in the home with them. The client feels that the mother needs more care than she can give her but when her mother to a paperwork she gave the clients brother power of attorney in the clients husband medical power of Atty. She indicated that she will be speaking with her mothers medical Dr. this week to see if medically he feels there is justification for more care and the client can give. She will also attempt to follow up with the attorney in [place] to do well he power of attorney to see if there's anything she can do legally.  The client said that she became physically ill yesterday which is a progression from having a nervous stomach and headaches. We talked about ways that the client could create some space for herself and set some healthy limits with her mother until other arrangements can be made for her mother to be taking care of. We reviewed the clients coping skills. We also talked about the clients brother becoming more active in care for the mother. I suggested that she talk to her brother about taking the mother for longer periods of time so the client can have a break from her mother. I reminded the client and her health was not going to be taking care of if she is living under constant stress from taking care of her mother who she has a strained relationship with. The client does contract for safety having no thoughts of hurting herself or anyone  else.  Plan: Return again in 1 weeks.  Diagnosis: Axis I: 296.32    Axis II: Deferred    French AnaETERS,Lazaria Schaben M, [facility] 05/03/2013

## 2013-05-04 ENCOUNTER — Encounter (HOSPITAL_COMMUNITY): Payer: Self-pay | Admitting: Psychiatry

## 2013-05-04 ENCOUNTER — Ambulatory Visit (INDEPENDENT_AMBULATORY_CARE_PROVIDER_SITE_OTHER): Payer: BC Managed Care – PPO | Admitting: Psychiatry

## 2013-05-04 ENCOUNTER — Encounter (INDEPENDENT_AMBULATORY_CARE_PROVIDER_SITE_OTHER): Payer: Self-pay

## 2013-05-04 VITALS — BP 114/79 | HR 88 | Wt 162.0 lb

## 2013-05-04 DIAGNOSIS — F411 Generalized anxiety disorder: Secondary | ICD-10-CM

## 2013-05-04 DIAGNOSIS — F331 Major depressive disorder, recurrent, moderate: Secondary | ICD-10-CM

## 2013-05-04 MED ORDER — HYDROXYZINE PAMOATE 50 MG PO CAPS: 50.0000 mg | ORAL_CAPSULE | Freq: Every evening | ORAL | Status: DC | PRN

## 2013-05-04 MED ORDER — FLUOXETINE HCL 40 MG PO CAPS: 80.0000 mg | ORAL_CAPSULE | Freq: Every day | ORAL | Status: DC
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
## 2013-05-10 ENCOUNTER — Other Ambulatory Visit: Payer: Self-pay | Admitting: Family Medicine

## 2013-05-11 ENCOUNTER — Ambulatory Visit (INDEPENDENT_AMBULATORY_CARE_PROVIDER_SITE_OTHER): Payer: BC Managed Care – PPO | Admitting: Behavioral Health

## 2013-05-11 ENCOUNTER — Encounter (HOSPITAL_COMMUNITY): Payer: Self-pay | Admitting: Behavioral Health

## 2013-05-11 ENCOUNTER — Encounter (INDEPENDENT_AMBULATORY_CARE_PROVIDER_SITE_OTHER): Payer: Self-pay

## 2013-05-11 DIAGNOSIS — F331 Major depressive disorder, recurrent, moderate: Secondary | ICD-10-CM
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
## 2013-05-16 ENCOUNTER — Ambulatory Visit (INDEPENDENT_AMBULATORY_CARE_PROVIDER_SITE_OTHER): Payer: BC Managed Care – PPO | Admitting: Family Medicine

## 2013-05-16 ENCOUNTER — Encounter: Payer: Self-pay | Admitting: Family Medicine

## 2013-05-16 VITALS — BP 120/77 | HR 77 | Wt 162.0 lb

## 2013-05-16 DIAGNOSIS — F32A Depression, unspecified: Secondary | ICD-10-CM

## 2013-05-16 DIAGNOSIS — F3289 Other specified depressive episodes: Secondary | ICD-10-CM

## 2013-05-16 DIAGNOSIS — F329 Major depressive disorder, single episode, unspecified: Secondary | ICD-10-CM

## 2013-05-16 MED ORDER — ARIPIPRAZOLE 2 MG PO TABS: 2.0000 mg | ORAL_TABLET | Freq: Every day | ORAL | Status: DC

## 2013-05-16 NOTE — Progress Notes (Signed)
CC: Darlene Ochoa is a 48 y.o. female is here for Depression   Subjective: HPI:  Patient presents for same day appointment complaining of worsening subjective depression. She defines this as constantly wanting to remove herself from her mother's presence and sleep. She reports that sleeping temporarily helps her depression because it offers her a temporary escape. Depression is further defined as fluctuating dietary habits, poor outlook on her current living situation, and loss of all hobbies and former pleasurable activities. Symptoms have been present and worsening over the past 4 months since her mother moved in with them. Symptoms are worsened by her mothers forgetfulness, manipulative behavior, and pessimism. She reports her current level of depression as moderate to severe. She continues on fluoxetine on a daily basis this was increased a little over a month ago she does not believe it's providing much benefit from depression but she denies any anxiety. Denies fevers, chills, substance abuse, shortness of breath, chest discomfort, unintentional weight loss or gain   Review Of Systems Outlined In HPI  Past Medical History  Diagnosis Date  . Depression   . Seasonal allergies   . Hypertension   . Major depressive disorder 03/31/2012  . Chest pain 03/31/2012  . H/O prolonged Q-T interval on ECG 03/31/2012  . Anxiety      Family History  Problem Relation Age of Onset  . Diabetes Mother   . Cancer Mother     Breast  . Depression Mother   . CAD Mother   . Heart failure Father   . Stroke Father   . Alcohol abuse Father   . Alcohol abuse Brother   . Drug abuse Brother      History  Substance Use Topics  . Smoking status: Never Smoker   . Smokeless tobacco: Never Used  . Alcohol Use: No     Objective: Filed Vitals:   05/16/13 1549  BP: 120/77  Pulse: 77    Vital signs reviewed. General: Alert and Oriented, No Acute Distress HEENT: Pupils equal, round, reactive to light.  Conjunctivae clear.  External ears unremarkable.  Moist mucous membranes. Lungs: Clear and comfortable work of breathing, speaking in full sentences without accessory muscle use. Cardiac: Regular rate and rhythm.  Neuro: CN II-XII grossly intact, gait normal. Extremities: No peripheral edema.  Strong peripheral pulses.  Mental Status: Mild depression and moderate frustration. No anxiety, nor agitation. Logical though process. Skin: Warm and dry.  Assessment & Plan: Darlene Ochoa was seen today for depression.  Diagnoses and associated orders for this visit:  Depression - ARIPiprazole (ABILIFY) 2 MG tablet; Take 1 tablet (2 mg total) by mouth daily.    Depression: Uncontrolled, again urged family to strongly consider placement of the mother for the patient's health but primarily and most importantly for her mothers benefit. Suspect that symptoms she complains about today will be significantly improved when the mother is no longer living at their house. Since patient does not have follow up with her psychiatrist until March we'll start her on low-dose Abilify pending her psych followup appointment or placement mother. Contracted for safety  I have placed a social worker consult through a local home health agency to help with her mothers placement  40 minutes spent face-to-face during visit today of which at least 50% was counseling or coordinating care regarding: 1. Depression      Return in about 4 weeks (around 06/13/2013).

## 2013-05-17 ENCOUNTER — Encounter (HOSPITAL_COMMUNITY): Payer: Self-pay | Admitting: Behavioral Health

## 2013-05-17 ENCOUNTER — Telehealth: Payer: Self-pay | Admitting: Family Medicine

## 2013-05-17 ENCOUNTER — Ambulatory Visit (INDEPENDENT_AMBULATORY_CARE_PROVIDER_SITE_OTHER): Payer: BC Managed Care – PPO | Admitting: Behavioral Health

## 2013-05-17 DIAGNOSIS — F331 Major depressive disorder, recurrent, moderate: Secondary | ICD-10-CM

## 2013-05-17 NOTE — Telephone Encounter (Signed)
Pt came by and wants to know if there is generic for Abilify because it costs $150.00

## 2013-05-17 NOTE — Telephone Encounter (Signed)
Dr. Demetrius CharityP., [name] came to see me yesterday for worsening depression thought to be due to the stress of her mother living with her.  I proposed adding Abilify however it looks like the cost of this may complicate matters.  Can you offer any advice on any cheaper antipsychotic that would supplement her fluoxetine for depression?

## 2013-05-17 NOTE — Progress Notes (Signed)
   THERAPIST PROGRESS NOTE  Session Time: 9:00  Participation Level: Active  Behavioral Response: NeatAlertDepressed  Type of Therapy: Individual Therapy  Treatment Goals addressed: Coping  Interventions: CBT  Summary: Darlene Ochoa is a 48 y.o. female who presents with depression.   Suicidal/Homicidal: Nowithout intent/plan  Therapist Response: The client indicated that she was in a pretty good place today. She had met with her medical doctor which is also her mother's medical Dr. The medical doctor will be recommending home health care for the clients mother which she says she was relieved about this will take some of the stress and pressure off with her. She indicates it is still been stressful for her as indicated by upset stomach. She also indicates that when her husband is home she is sleeping a lot during the day because she knows that if there is any issue her husband can Soh with it and she does not have to. She is concerned because it is starting to affect her family. She is hopeful that the home health care will be an instrument in which they can help take care of her mother but they can also begin to see what issues and her mother may be dealing with. She indicates that her mother at times appears to" blank out" and other times appears to be fully engaged was going on around her.  We again talked about the necessity of boundaries with her mother but with her brother who she feels would be helpful in taking care of the mother some. We also talked about anxiety reduction including fighting time for herself away from the home. The client does recognize that she cannot change her mother is beginning to see the importance of how she does with her mother will make a difference in reduction of anxiety. She does contract for safety having no thoughts of hurting herself or anyone else.  Plan: Return again in 1 weeks.  Diagnosis: Axis I: 296.32    Axis II: Deferred    Sabas Sous,  [facility] 05/17/2013

## 2013-05-18 NOTE — Telephone Encounter (Signed)
I agree that her stress is related to her current living arrangements with her mother. I spoke to her therapist to ascertain any other causes for her worsening of depression, but he reported no other known stressors.  In this case, and I have spoken to Darlene Ochoa about this, as long as her living situation remains the same, I'm not sure if augmenting her current medication regimen would improve her depression.

## 2013-05-18 NOTE — Telephone Encounter (Signed)
Left message on pt.'s vm.

## 2013-05-18 NOTE — Telephone Encounter (Signed)
Sue LushAndrea, Will you please let Mrs. Adrian know that Dr. Demetrius CharityP feels that until her living situation improves he's not sure if adding anything like abilify will make a big difference.  Since we can't think of any other cheaper alternatives I'd encourage her to try the abilify if it's still available from a financial standpoint.

## 2013-05-20 ENCOUNTER — Other Ambulatory Visit (HOSPITAL_COMMUNITY): Payer: Self-pay | Admitting: Psychiatry

## 2013-05-23 ENCOUNTER — Ambulatory Visit (INDEPENDENT_AMBULATORY_CARE_PROVIDER_SITE_OTHER): Payer: BC Managed Care – PPO | Admitting: Family Medicine

## 2013-05-23 ENCOUNTER — Encounter: Payer: Self-pay | Admitting: Family Medicine

## 2013-05-23 VITALS — BP 128/83 | HR 96 | Temp 98.1°F | Wt 160.0 lb

## 2013-05-23 DIAGNOSIS — F329 Major depressive disorder, single episode, unspecified: Secondary | ICD-10-CM

## 2013-05-23 DIAGNOSIS — A499 Bacterial infection, unspecified: Secondary | ICD-10-CM

## 2013-05-23 DIAGNOSIS — B9689 Other specified bacterial agents as the cause of diseases classified elsewhere: Secondary | ICD-10-CM

## 2013-05-23 DIAGNOSIS — J329 Chronic sinusitis, unspecified: Secondary | ICD-10-CM

## 2013-05-23 MED ORDER — AZITHROMYCIN 250 MG PO TABS: ORAL_TABLET | ORAL | Status: AC

## 2013-05-23 NOTE — Progress Notes (Signed)
CC: Lacretia NicksSusan Murty is a 48 y.o. female is here for Allergies   Subjective: HPI:  Complains of facial pressure and nasal congestion both of which are moderate in severity pressure localizes beneath both eyes nonradiating. Symptoms are worse first thing in the morning slightly improved during the day. Symptoms are also improved with taking pseudoephedrine. No other interventions as of yet. Accompanied by subjective postnasal drip. Denies fevers, chills, cough, shortness of breath, wheezing, nor any motor or sensory disturbances   Followup depression: Since starting the Abilify patient states she has had significant improvement of depression.  Over the weekend she felt more like doing all hobbies.  She is no longer having a desire to sleep during the day to get away from stress in her life. She had productive conversation with her mother over the weekend which has also helped symptoms. She denies any anxiety or depression and is currently interfering with quality of life.  Review Of Systems Outlined In HPI  Past Medical History  Diagnosis Date  . Depression   . Seasonal allergies   . Hypertension   . Major depressive disorder 03/31/2012  . Chest pain 03/31/2012  . H/O prolonged Q-T interval on ECG 03/31/2012  . Anxiety      Family History  Problem Relation Age of Onset  . Diabetes Mother   . Cancer Mother     Breast  . Depression Mother   . CAD Mother   . Heart failure Father   . Stroke Father   . Alcohol abuse Father   . Alcohol abuse Brother   . Drug abuse Brother      History  Substance Use Topics  . Smoking status: Never Smoker   . Smokeless tobacco: Never Used  . Alcohol Use: No     Objective: Filed Vitals:   05/23/13 0908  BP: 128/83  Pulse: 96  Temp: 98.1 F (36.7 C)    General: Alert and Oriented, No Acute Distress HEENT: Pupils equal, round, reactive to light. Conjunctivae clear.  External ears unremarkable, canals clear with intact TMs with appropriate  landmarks.  Middle ear appears open without effusion. Pink inferior turbinates.  Moist mucous membranes, pharynx without inflammation nor lesions however moderate cobblestoning.  Neck supple without palpable lymphadenopathy nor abnormal masses. maxillary sinus tenderness bilaterally  Lungs: Clear to auscultation bilaterally, no wheezing/ronchi/rales.  Comfortable work of breathing. Good air movement. Mental Status: No depression, anxiety, nor agitation. Skin: Warm and dry.  Assessment & Plan: [name] was seen today for allergies.  Diagnoses and associated orders for this visit:  Bacterial sinusitis - azithromycin (ZITHROMAX) 250 MG tablet; Take two tabs at once on day 1, then one tab daily on days 2-5.  Major depressive disorder    Bacterial sinusitis: Start azithromycin we'll switch to levofloxacin if not improving after 2-3 days   Maj. depressive disorder: Improved continue Abilify  Return if symptoms worsen or fail to improve.

## 2013-05-25 ENCOUNTER — Encounter (HOSPITAL_COMMUNITY): Payer: Self-pay | Admitting: Behavioral Health

## 2013-05-25 ENCOUNTER — Ambulatory Visit (INDEPENDENT_AMBULATORY_CARE_PROVIDER_SITE_OTHER): Payer: BC Managed Care – PPO | Admitting: Behavioral Health

## 2013-05-25 ENCOUNTER — Encounter: Payer: Self-pay | Admitting: Family Medicine

## 2013-05-25 ENCOUNTER — Ambulatory Visit (INDEPENDENT_AMBULATORY_CARE_PROVIDER_SITE_OTHER): Payer: BC Managed Care – PPO | Admitting: Family Medicine

## 2013-05-25 VITALS — BP 130/87 | HR 83 | Temp 98.7°F | Wt 160.0 lb

## 2013-05-25 DIAGNOSIS — H6980 Other specified disorders of Eustachian tube, unspecified ear: Secondary | ICD-10-CM

## 2013-05-25 DIAGNOSIS — F331 Major depressive disorder, recurrent, moderate: Secondary | ICD-10-CM

## 2013-05-25 MED ORDER — OXYMETAZOLINE HCL 0.05 % NA SOLN: NASAL | Status: DC

## 2013-05-25 NOTE — Progress Notes (Signed)
CC: Darlene Ochoa is a 48 y.o. female is here for ears feel full and achey   Subjective: HPI:  Right ear pain described as fullness mild to moderate in severity present since 2 days ago. It comes and goes throughout the day nothing particularly makes better or worse. Accompanied by nasal congestion and facial pressure above and beneath both eyes however this has significantly improved and is almost gone since starting azithromycin. Pain is nonradiating. Present all hours of the day. Denies hearing loss, nor discharge from the ear. Denies fevers, chills, cough, nor shortness of breath   Review Of Systems Outlined In HPI  Past Medical History  Diagnosis Date  . Depression   . Seasonal allergies   . Hypertension   . Major depressive disorder 03/31/2012  . Chest pain 03/31/2012  . H/O prolonged Q-T interval on ECG 03/31/2012  . Anxiety     Past Surgical History  Procedure Laterality Date  . Cesarean section  1998   Family History  Problem Relation Age of Onset  . Diabetes Mother   . Cancer Mother     Breast  . Depression Mother   . CAD Mother   . Heart failure Father   . Stroke Father   . Alcohol abuse Father   . Alcohol abuse Brother   . Drug abuse Brother     History   Social History  . Marital Status: Married    Spouse Name: N/A    Number of Children: N/A  . Years of Education: N/A   Occupational History  . Not on file.   Social History Main Topics  . Smoking status: Never Smoker   . Smokeless tobacco: Never Used  . Alcohol Use: No  . Drug Use: No  . Sexual Activity: Yes    Partners: Male   Other Topics Concern  . Not on file   Social History Narrative  . No narrative on file     Objective: BP 130/87  Pulse 83  Temp(Src) 98.7 F (37.1 C) (Oral)  Wt 160 lb (72.576 kg)  General: Alert and Oriented, No Acute Distress HEENT: Pupils equal, round, reactive to light. Conjunctivae clear.  External ears unremarkable, canals clear with intact TMs with  appropriate landmarks.  Middle ear appears open without effusion. Pink inferior turbinates.  Moist mucous membranes, pharynx without inflammation nor lesions.  Neck supple without palpable lymphadenopathy nor abnormal masses. Mental Status: No depression, anxiety, nor agitation. Skin: Warm and dry.  Assessment & Plan: Darlene PikesSusan was seen today for ears feel full and achey.  Diagnoses and associated orders for this visit:  Eustachian tube dysfunction  Other Orders - oxymetazoline (AFRIN) 0.05 % nasal spray; Three sprays each nostril twice a day no longer than 3 days.    Reassurance provided that there is no new sign of an ear infection or effusion this is most likely due to eustachian tube dysfunction due to resolving sinusitis. She does not want to use nasal steroid or oral steroids therefore we will try Afrin for no longer than 3 days. Call if no better by Friday in which we would switch to Levaquin   Return if symptoms worsen or fail to improve.

## 2013-05-25 NOTE — Progress Notes (Signed)
   THERAPIST PROGRESS NOTE  Session Time: 9:00  Participation Level: Active  Behavioral Response: NeatAlertDepressed/anxious  Type of Therapy: Individual Therapy  Treatment Goals addressed: Coping  Interventions: CBT  Summary: Lacretia NicksSusan Sethi is a 48 y.o. female who presents with depression.   Suicidal/Homicidal: Nowithout intent/plan  Therapist Response: The client reported that she started on Abilify 5 days ago and has already seen an elevation in mood. She also said that she had a lengthy conversation with her mother approximately 5 days ago which she said has changed the way that she feels. She indicates that her mother was calm and open. She indicated that talked about all the things that have been difficult since the mother moved into the house and that the mother was very agreeable to not only some changes taking place at to how things could be handle her in the home for each of them. She stated that their relationship has been much better over the past 5 days therefore she has had not had stomach issues or head issues other than a sinus infection. She understands that there may still be some difficult issues that they have to Gibbs with but is thankful that she has been able to have this conversation with her mother and there have been some very positive changes. She credits a supportive family and prayer not only for her mother but for herself her family is making a difference in this situation. She does contract for safety saying she has no thoughts of hurting herself. She was bright and articulate.  Plan: Return again in 2 weeks.  Diagnosis: Axis I: 296.32    Axis II: Deferred    Lizvet Chunn M, LPC 05/25/2013

## 2013-06-07 ENCOUNTER — Ambulatory Visit (HOSPITAL_COMMUNITY): Payer: Self-pay | Admitting: Behavioral Health

## 2013-06-09 ENCOUNTER — Encounter (HOSPITAL_COMMUNITY): Payer: Self-pay | Admitting: Behavioral Health

## 2013-06-09 ENCOUNTER — Other Ambulatory Visit: Payer: Self-pay | Admitting: Family Medicine

## 2013-06-09 ENCOUNTER — Ambulatory Visit (INDEPENDENT_AMBULATORY_CARE_PROVIDER_SITE_OTHER): Payer: BC Managed Care – PPO | Admitting: Behavioral Health

## 2013-06-09 DIAGNOSIS — F330 Major depressive disorder, recurrent, mild: Secondary | ICD-10-CM

## 2013-06-09 DIAGNOSIS — F411 Generalized anxiety disorder: Secondary | ICD-10-CM

## 2013-06-09 NOTE — Progress Notes (Signed)
   THERAPIST PROGRESS NOTE  Session Time: 10:00  Participation Level: Active  Behavioral Response: NeatAlertAnxious  Type of Therapy: Individual Therapy  Treatment Goals addressed: Coping  Interventions: CBT  Summary: [name] is a 48 y.o. female who presents with anxiety.   Suicidal/Homicidal: Nowithout intent/plan  Therapist Response: The client indicated that she had not slept well for the past week or so. She reports going to bed at a normal time but waking up to 3 times per night at times going back to sleep easily at times taking several hours. She says that there is nothing significant a stressful or that she is thinking about. She reports no changes in habits or dietary intake. She reports no caffeine use. She reports some physical discomfort with a tooth but says that's only been the last couple of days and off for the past week. She did say she is able to take a nap during the day if needed but is trying not to. We talked about some relaxation techniques that she could use in may benefit in helping her sleep. The her that if this continued for an extended amount of time that she may want to speak with her doctor about that.  She reports otherwise she is doing well. The conversation that she had with her mom prior to the last session continues to have benefits. She indicates that her mother has been in a completely different mood with the exception of a few small negative comments. She said that the atmosphere around the house has improved and anxiety has gone down significantly. She does contract for safety having no thoughts of hurting herself or anyone else.  Plan: Return again in 2 weeks.  Diagnosis: Axis I: 296.31/313    Axis II: Deferred    Darlene AnaETERS,Larayne Baxley M, [facility] 06/09/2013

## 2013-06-21 ENCOUNTER — Ambulatory Visit (HOSPITAL_COMMUNITY): Payer: Self-pay | Admitting: Behavioral Health

## 2013-06-27 ENCOUNTER — Encounter (HOSPITAL_COMMUNITY): Payer: Self-pay | Admitting: Behavioral Health

## 2013-06-27 ENCOUNTER — Ambulatory Visit (INDEPENDENT_AMBULATORY_CARE_PROVIDER_SITE_OTHER): Payer: BC Managed Care – PPO | Admitting: Behavioral Health

## 2013-06-27 DIAGNOSIS — F331 Major depressive disorder, recurrent, moderate: Secondary | ICD-10-CM
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
## 2013-06-28 ENCOUNTER — Ambulatory Visit (HOSPITAL_COMMUNITY): Payer: Self-pay | Admitting: Behavioral Health

## 2013-07-04 ENCOUNTER — Encounter (INDEPENDENT_AMBULATORY_CARE_PROVIDER_SITE_OTHER): Payer: Self-pay

## 2013-07-04 ENCOUNTER — Ambulatory Visit (INDEPENDENT_AMBULATORY_CARE_PROVIDER_SITE_OTHER): Payer: BC Managed Care – PPO | Admitting: Psychiatry

## 2013-07-04 ENCOUNTER — Encounter (HOSPITAL_COMMUNITY): Payer: Self-pay | Admitting: Psychiatry

## 2013-07-04 VITALS — BP 116/79 | HR 73 | Wt 166.0 lb

## 2013-07-04 DIAGNOSIS — F331 Major depressive disorder, recurrent, moderate: Secondary | ICD-10-CM

## 2013-07-04 DIAGNOSIS — F411 Generalized anxiety disorder: Secondary | ICD-10-CM

## 2013-07-04 DIAGNOSIS — F32A Depression, unspecified: Secondary | ICD-10-CM

## 2013-07-04 DIAGNOSIS — F329 Major depressive disorder, single episode, unspecified: Secondary | ICD-10-CM

## 2013-07-04 MED ORDER — TRAZODONE HCL 50 MG PO TABS: 50.0000 mg | ORAL_TABLET | Freq: Every day | ORAL | Status: DC

## 2013-07-04 MED ORDER — ARIPIPRAZOLE 2 MG PO TABS: 2.0000 mg | ORAL_TABLET | Freq: Every day | ORAL | Status: DC

## 2013-07-04 MED ORDER — FLUOXETINE HCL 40 MG PO CAPS: 80.0000 mg | ORAL_CAPSULE | Freq: Every day | ORAL | Status: DC
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
## 2013-07-07 ENCOUNTER — Other Ambulatory Visit: Payer: Self-pay | Admitting: Family Medicine

## 2013-08-25 ENCOUNTER — Other Ambulatory Visit: Payer: Self-pay | Admitting: Family Medicine

## 2013-08-26 ENCOUNTER — Telehealth: Payer: Self-pay | Admitting: Family Medicine

## 2013-08-26 MED ORDER — ARIPIPRAZOLE 2 MG PO TABS: ORAL_TABLET | ORAL | Status: DC

## 2013-08-26 NOTE — Telephone Encounter (Signed)
Refill req 

## 2013-09-15 ENCOUNTER — Other Ambulatory Visit (HOSPITAL_COMMUNITY): Payer: Self-pay | Admitting: *Deleted

## 2013-09-15 DIAGNOSIS — F331 Major depressive disorder, recurrent, moderate: Secondary | ICD-10-CM

## 2013-09-15 MED ORDER — TRAZODONE HCL 50 MG PO TABS: 50.0000 mg | ORAL_TABLET | Freq: Every day | ORAL | Status: DC

## 2013-09-15 NOTE — Telephone Encounter (Signed)
Last appt 07/04/13 with Dr. Laury Deep Refill for 30 days per Dr. Valentina Shaggy YO:[id number] flag RX-Needs to make appointment for follow up

## 2013-09-30 ENCOUNTER — Ambulatory Visit (INDEPENDENT_AMBULATORY_CARE_PROVIDER_SITE_OTHER): Payer: BC Managed Care – PPO | Admitting: Physician Assistant

## 2013-09-30 ENCOUNTER — Encounter (INDEPENDENT_AMBULATORY_CARE_PROVIDER_SITE_OTHER): Payer: Self-pay

## 2013-09-30 ENCOUNTER — Encounter (HOSPITAL_COMMUNITY): Payer: Self-pay | Admitting: Physician Assistant

## 2013-09-30 VITALS — BP 110/74 | HR 68 | Ht 65.0 in | Wt 169.0 lb

## 2013-09-30 DIAGNOSIS — F331 Major depressive disorder, recurrent, moderate: Secondary | ICD-10-CM

## 2013-09-30 DIAGNOSIS — F329 Major depressive disorder, single episode, unspecified: Secondary | ICD-10-CM

## 2013-09-30 NOTE — Progress Notes (Signed)
   [facility] Follow-up Outpatient Visit  Darlene Ochoa 12-Jul-1965  Date: 09/30/2013    Subjective: Patient is in today to refill her medications as noted below. She has been doing well but has not been taking the Abilify due to the high co-pay of 150.$ per month. Otherwise she has been doing well. Her sleep is ok, appetite is good.   Filed Vitals:   09/30/13 1338  BP: 110/74  Pulse: 68    Mental Status Examination  Appearance: neat and clean well dressed Alert: Yes Attention: good  Cooperative: Yes Eye Contact: Good Speech: clear and goal directed. Psychomotor Activity: Normal Memory/Concentration: good Oriented: person, place and time/date Mood: Euthymic Affect: Congruent Thought Processes and Associations: Coherent and Goal Directed Fund of Knowledge: Good Thought Content: normal Insight: Good Judgement: Good  Diagnosis: MDD   Treatment Plan:  Patient will get EKG to follow up on possible prolonged QT syndrome. Then follow up in two weeks. Rona RavensNeil T. Adelisa Satterwhite RPAC 2:18 PM 09/30/2013

## 2013-09-30 NOTE — Patient Instructions (Signed)
Patient will need EKG due to history of possible prolonged QT.

## 2013-10-06 ENCOUNTER — Encounter: Payer: Self-pay | Admitting: Family Medicine

## 2013-10-06 ENCOUNTER — Ambulatory Visit (INDEPENDENT_AMBULATORY_CARE_PROVIDER_SITE_OTHER): Payer: BC Managed Care – PPO | Admitting: Family Medicine

## 2013-10-06 VITALS — BP 112/72 | HR 76 | Ht 65.0 in | Wt 167.0 lb

## 2013-10-06 DIAGNOSIS — R9431 Abnormal electrocardiogram [ECG] [EKG]: Secondary | ICD-10-CM | POA: Insufficient documentation

## 2013-10-06 DIAGNOSIS — R5381 Other malaise: Secondary | ICD-10-CM

## 2013-10-06 DIAGNOSIS — G4700 Insomnia, unspecified: Secondary | ICD-10-CM

## 2013-10-06 DIAGNOSIS — R5383 Other fatigue: Secondary | ICD-10-CM

## 2013-10-06 LAB — CBC
HCT: 38.6 % (ref 36.0–46.0)
HEMOGLOBIN: 13.3 g/dL (ref 12.0–15.0)
MCH: 30 pg (ref 26.0–34.0)
MCHC: 34.5 g/dL (ref 30.0–36.0)
MCV: 86.9 fL (ref 78.0–100.0)
Platelets: 276 10*3/uL (ref 150–400)
RBC: 4.44 MIL/uL (ref 3.87–5.11)
RDW: 13.7 % (ref 11.5–15.5)
WBC: 5.5 10*3/uL (ref 4.0–10.5)

## 2013-10-06 MED ORDER — ZOLPIDEM TARTRATE 10 MG PO TABS: 10.0000 mg | ORAL_TABLET | Freq: Every evening | ORAL | Status: DC | PRN

## 2013-10-06 NOTE — Progress Notes (Signed)
CC: Darlene NicksSusan Ochoa is a 48 y.o. female is here for Abnormal ECG   Subjective: HPI:  Presents at the request of her psychiatrist for evaluation of possible prolonged QTC. She's currently taking trazodone and Prozac on a daily basis. She has temporary stop taking Abilify due to the cost of the medication. She denies irregular heartbeat, chest pain, lightheadedness, motor or sensory disturbances. She does endorse some fatigue and has been present for the past one to 2 months a daily basis. Complains of nonrestorative sleep and decreased motivation and energy throughout the day. Symptoms are noticed with daily activities such as laundry or taking her children to the pool.  She describes symptoms as moderate in severity. Nothing particularly makes symptoms better or worse.  She states that she's taking trazodone for difficulty falling and staying asleep. It has been beneficial however she still feels poorly rested in the morning.     Review Of Systems Outlined In HPI  Past Medical History  Diagnosis Date  . Depression   . Seasonal allergies   . Hypertension   . Major depressive disorder 03/31/2012  . Chest pain 03/31/2012  . H/O prolonged Q-T interval on ECG 03/31/2012  . Anxiety     Past Surgical History  Procedure Laterality Date  . Cesarean section  1998   Family History  Problem Relation Age of Onset  . Diabetes Mother   . Cancer Mother     Breast  . Depression Mother   . CAD Mother   . Heart failure Father   . Stroke Father   . Alcohol abuse Father   . Alcohol abuse Brother   . Drug abuse Brother     History   Social History  . Marital Status: Married    Spouse Name: N/A    Number of Children: N/A  . Years of Education: N/A   Occupational History  . Not on file.   Social History Main Topics  . Smoking status: Never Smoker   . Smokeless tobacco: Never Used  . Alcohol Use: No  . Drug Use: No  . Sexual Activity: Yes    Partners: Male   Other Topics Concern  .  Not on file   Social History Narrative  . No narrative on file     Objective: BP 112/72  Pulse 76  Ht 5\' 5"  (1.651 m)  Wt 167 lb (75.751 kg)  BMI 27.79 kg/m2  General: Alert and Oriented, No Acute Distress HEENT: Pupils equal, round, reactive to light. Conjunctivae clear. Moist mucous membranes pharynx unremarkable Lungs: Clear to auscultation bilaterally, no wheezing/ronchi/rales.  Comfortable work of breathing. Good air movement. Cardiac: Regular rate and rhythm. Normal S1/S2.  No murmurs, rubs, nor gallops.   Extremities: No peripheral edema.  Strong peripheral pulses.  Mental Status: No depression, anxiety, nor agitation. Skin: Warm and dry.  Assessment & Plan: [name] was seen today for abnormal ecg.  Diagnoses and associated orders for this visit:  Abnormal EKG - EKG 12-Lead  Prolonged Q-T interval on ECG - TSH - COMPLETE METABOLIC PANEL WITH GFR  Insomnia - zolpidem (AMBIEN) 10 MG tablet; Take 1 tablet (10 mg total) by mouth at bedtime as needed for sleep.  Fatigue - CBC - TSH - COMPLETE METABOLIC PANEL WITH GFR    EKG was obtained showing normal sinus rhythm, normal axis, normal intervals other than a prolonged QTC, no ST elevation or depression. Compared to a tracing from December 2013 the only difference is a new prolonged QTC. Based on  the history and physical during her hospital stay in December 2013 she was taking Prozac 40 mg at that time when a EKG was obtained showing no prolonged QTC.  With this in mind we'll stop trazodone since this is a potential contributor to her QTC prolongation, continue current Prozac regimen. Substituting Ambien for prior trazodone. I've asked her to return in 2 weeks for repeat EKG and if continues to be prolonged one possibility would be to decrease her Prozac and restart Abilify. Rule out hyperthyroidism and electrolyte abnormality contributing to prolonged QTC  Fatigue: Checking for anemia and hypothyroidism  Return in about  2 weeks (around 10/20/2013) for Nurse Visit EKG for QTc Check.

## 2013-10-07 LAB — COMPLETE METABOLIC PANEL WITH GFR
ALT: 13 U/L (ref 0–35)
AST: 14 U/L (ref 0–37)
Albumin: 4 g/dL (ref 3.5–5.2)
Alkaline Phosphatase: 69 U/L (ref 39–117)
BUN: 15 mg/dL (ref 6–23)
CALCIUM: 9.4 mg/dL (ref 8.4–10.5)
CHLORIDE: 101 meq/L (ref 96–112)
CO2: 27 meq/L (ref 19–32)
CREATININE: 0.81 mg/dL (ref 0.50–1.10)
GFR, EST NON AFRICAN AMERICAN: 86 mL/min
Glucose, Bld: 81 mg/dL (ref 70–99)
Potassium: 3.4 mEq/L — ABNORMAL LOW (ref 3.5–5.3)
Sodium: 139 mEq/L (ref 135–145)
Total Bilirubin: 0.3 mg/dL (ref 0.2–1.2)
Total Protein: 7.2 g/dL (ref 6.0–8.3)

## 2013-10-07 LAB — TSH: TSH: 0.637 u[IU]/mL (ref 0.350–4.500)

## 2013-10-20 ENCOUNTER — Ambulatory Visit (INDEPENDENT_AMBULATORY_CARE_PROVIDER_SITE_OTHER): Payer: BC Managed Care – PPO | Admitting: Sports Medicine

## 2013-10-20 DIAGNOSIS — R9431 Abnormal electrocardiogram [ECG] [EKG]: Secondary | ICD-10-CM

## 2013-10-20 NOTE — Progress Notes (Signed)
   48 year old female on multiple psychotropics, here as a nurse visit for Dr. Ivan AnchorsHommel for repeat EKG, psychiatrist sent the patient to Dr. Ivan AnchorsHommel for prolonged QTC, she was on Abilify, Prozac, trazodone. Currently she is only on Prozac, discontinued Abilify due to cost, and trazodone due to QTC.  12-lead EKG personally reviewed, normal rate, rhythm, axis, QTC is decreased to 443 ms.

## 2013-10-20 NOTE — Assessment & Plan Note (Signed)
After discontinuation of trazodone QTC decreased to 443 ms. Normal axis, normal rate, normal rhythm. This is well within the normal range and a QTC less than 500 milliseconds is unlikely to be pathologic. Adding trazodone to list of intolerances.

## 2013-10-23 ENCOUNTER — Encounter: Payer: Self-pay | Admitting: Family Medicine

## 2013-11-12 ENCOUNTER — Other Ambulatory Visit (HOSPITAL_COMMUNITY): Payer: Self-pay | Admitting: Psychiatry

## 2013-11-14 ENCOUNTER — Encounter: Payer: Self-pay | Admitting: Family Medicine

## 2013-11-14 ENCOUNTER — Ambulatory Visit (INDEPENDENT_AMBULATORY_CARE_PROVIDER_SITE_OTHER): Payer: BC Managed Care – PPO | Admitting: Family Medicine

## 2013-11-14 VITALS — BP 126/80 | HR 89 | Temp 98.2°F | Wt 171.0 lb

## 2013-11-14 DIAGNOSIS — G4700 Insomnia, unspecified: Secondary | ICD-10-CM

## 2013-11-14 DIAGNOSIS — R300 Dysuria: Secondary | ICD-10-CM

## 2013-11-14 LAB — POCT URINALYSIS DIPSTICK
Bilirubin, UA: NEGATIVE
Glucose, UA: NEGATIVE
Ketones, UA: NEGATIVE
Nitrite, UA: NEGATIVE
Protein, UA: NEGATIVE
Spec Grav, UA: <=1.005
UROBILINOGEN UA: 0.2
pH, UA: 6

## 2013-11-14 MED ORDER — ESZOPICLONE 1 MG PO TABS: 1.0000 mg | ORAL_TABLET | Freq: Every evening | ORAL | Status: DC | PRN

## 2013-11-14 MED ORDER — CIPROFLOXACIN HCL 250 MG PO TABS: ORAL_TABLET | ORAL | Status: AC

## 2013-11-14 NOTE — Progress Notes (Signed)
CC: Darlene Ochoa is a 48 y.o. female is here for Dysuria and Back Pain   Subjective: HPI:  Patient complains of dysuria localized low in the pelvis with urination that radiates into the lower back. Symptoms present for one week accompanied by malodorous urine. Symptoms seem to be getting worse on a daily basis. Currently described as moderate in severity slightly improved with Azo nothing else has made it better or worse. Denies fevers, chills, flushing, nausea, vomiting, nor any gastrointestinal complaints.  Complains of continued insomnia, symptoms were improved with taking Ambien which helped keep her sleep and do to sleep however affects would linger throughout the day following a dose and caused intolerable sedation. She try taking half the dose but no improvement.  For the past week she has not taken anything to help with sleep and is having moderate to severe difficulty with falling asleep and staying asleep. Symptoms are worse during periods of stress brought on by her mother.   Review Of Systems Outlined In HPI  Past Medical History  Diagnosis Date  . Depression   . Seasonal allergies   . Hypertension   . Major depressive disorder 03/31/2012  . Chest pain 03/31/2012  . H/O prolonged Q-T interval on ECG 03/31/2012  . Anxiety     Past Surgical History  Procedure Laterality Date  . Cesarean section  1998   Family History  Problem Relation Age of Onset  . Diabetes Mother   . Cancer Mother     Breast  . Depression Mother   . CAD Mother   . Heart failure Father   . Stroke Father   . Alcohol abuse Father   . Alcohol abuse Brother   . Drug abuse Brother     History   Social History  . Marital Status: Married    Spouse Name: N/A    Number of Children: N/A  . Years of Education: N/A   Occupational History  . Not on file.   Social History Main Topics  . Smoking status: Never Smoker   . Smokeless tobacco: Never Used  . Alcohol Use: No  . Drug Use: No  . Sexual  Activity: Yes    Partners: Male   Other Topics Concern  . Not on file   Social History Narrative  . No narrative on file     Objective: BP 126/80  Pulse 89  Temp(Src) 98.2 F (36.8 C) (Oral)  Wt 171 lb (77.565 kg)  General: Alert and Oriented, No Acute Distress HEENT: Pupils equal, round, reactive to light. Conjunctivae clear.  Moist mucous membranes pharynx unremarkable Lungs: Clear to auscultation bilaterally, no wheezing/ronchi/rales.  Comfortable work of breathing. Good air movement. Cardiac: Regular rate and rhythm. Normal S1/S2.  No murmurs, rubs, nor gallops.   Extremities: No peripheral edema.  Strong peripheral pulses.  Mental Status: No depression, anxiety, nor agitation. Skin: Warm and dry.  Assessment & Plan: Darlene Ochoa was seen today for dysuria and back pain.  Diagnoses and associated orders for this visit:  Dysuria - Urinalysis Dipstick - Urine Culture - ciprofloxacin (CIPRO) 250 MG tablet; Take one by mouth twice a day for five days.  Insomnia - eszopiclone (LUNESTA) 1 MG TABS tablet; Take 1 tablet (1 mg total) by mouth at bedtime as needed for sleep. Take immediately before bedtime    Dysuria: Urinalysis and story is suggestive of UTI therefore start Cipro pending Urine culture Insomnia: Uncontrolled chronic condition start low-dose Lunesta we will titrate up if tolerated and needed.  Return in about 4 weeks (around 12/12/2013).

## 2013-11-16 ENCOUNTER — Ambulatory Visit (HOSPITAL_COMMUNITY): Payer: Self-pay | Admitting: Physician Assistant

## 2013-11-16 LAB — URINE CULTURE: Colony Count: >=100000

## 2013-11-18 ENCOUNTER — Ambulatory Visit (INDEPENDENT_AMBULATORY_CARE_PROVIDER_SITE_OTHER): Payer: BC Managed Care – PPO | Admitting: Family Medicine

## 2013-11-18 ENCOUNTER — Telehealth: Payer: Self-pay | Admitting: *Deleted

## 2013-11-18 ENCOUNTER — Encounter: Payer: Self-pay | Admitting: Family Medicine

## 2013-11-18 VITALS — BP 124/86 | HR 70 | Temp 98.3°F | Ht 65.0 in | Wt 171.0 lb

## 2013-11-18 DIAGNOSIS — R300 Dysuria: Secondary | ICD-10-CM

## 2013-11-18 DIAGNOSIS — R350 Frequency of micturition: Secondary | ICD-10-CM

## 2013-11-18 NOTE — Progress Notes (Signed)
Pt came into today because she is continuing  to have vaginal pressure and frequent urination. Per Dr. Ivan AnchorsHommel we are going to do a Urine culture because her last culture was contaminated.Marland Kitchen.Alva Garnet/Asley Baskerville,CMA

## 2013-11-18 NOTE — Telephone Encounter (Signed)
Pt called and states her dysuria is somewhat better but she is still having some sxs. She has some pelvic pain what she describes as "tightness" in the vaginal/pelvic area and urgency. Since abx sensitivities were not ran on urine culture advised her to come in for a nurse visit to recollect sample for culture.

## 2013-11-20 LAB — URINE CULTURE

## 2013-11-21 ENCOUNTER — Telehealth: Payer: Self-pay | Admitting: Family Medicine

## 2013-11-21 DIAGNOSIS — N390 Urinary tract infection, site not specified: Secondary | ICD-10-CM

## 2013-11-21 DIAGNOSIS — B3749 Other urogenital candidiasis: Secondary | ICD-10-CM

## 2013-11-21 MED ORDER — NITROFURANTOIN MONOHYD MACRO 100 MG PO CAPS: 100.0000 mg | ORAL_CAPSULE | Freq: Two times a day (BID) | ORAL | Status: AC

## 2013-11-21 NOTE — Telephone Encounter (Signed)
Sue Lushndrea, Will you please let patient know that her urine culture confirmed bacteria was still present.  A rx for macrobid was sent to her [facility].

## 2013-11-21 NOTE — Telephone Encounter (Signed)
Pt.notified

## 2013-11-23 ENCOUNTER — Ambulatory Visit (HOSPITAL_COMMUNITY): Payer: Self-pay | Admitting: Physician Assistant

## 2013-11-24 ENCOUNTER — Ambulatory Visit (HOSPITAL_COMMUNITY): Payer: Self-pay | Admitting: Physician Assistant

## 2013-11-25 ENCOUNTER — Ambulatory Visit (INDEPENDENT_AMBULATORY_CARE_PROVIDER_SITE_OTHER): Payer: BC Managed Care – PPO | Admitting: Family Medicine

## 2013-11-25 ENCOUNTER — Encounter: Payer: Self-pay | Admitting: Family Medicine

## 2013-11-25 VITALS — BP 127/78 | HR 84 | Temp 98.2°F | Wt 168.0 lb

## 2013-11-25 DIAGNOSIS — N390 Urinary tract infection, site not specified: Secondary | ICD-10-CM

## 2013-11-25 LAB — POCT URINALYSIS DIPSTICK
BILIRUBIN UA: NEGATIVE
GLUCOSE UA: NEGATIVE
Ketones, UA: NEGATIVE
NITRITE UA: NEGATIVE
PH UA: 6
Protein, UA: NEGATIVE
Spec Grav, UA: 1.025
Urobilinogen, UA: 0.2

## 2013-11-25 MED ORDER — CEPHALEXIN 500 MG PO CAPS: 500.0000 mg | ORAL_CAPSULE | Freq: Four times a day (QID) | ORAL | Status: DC

## 2013-11-25 NOTE — Progress Notes (Signed)
CC: Darlene NicksSusan Ochoa is a 48 y.o. female is here for abd discomfort   Subjective: HPI:  Complaints of continued discomfort localized to her bladder described as a spasm sensation that occurs after urinating in a constant sensation of needing to urinate. Symptoms have only mildly improved since taking nitrofurantoin. Symptoms have been persistent for the past 2 weeks. No benefit from ciprofloxacin. Recent culture showed diphtheroids in her urine. She denies fevers, chills, nausea, vomiting, abdominal pain other than that described above. Denies blood in urine nor any other genitourinary complaints    Review Of Systems Outlined In HPI  Past Medical History  Diagnosis Date  . Depression   . Seasonal allergies   . Hypertension   . Major depressive disorder 03/31/2012  . Chest pain 03/31/2012  . H/O prolonged Q-T interval on ECG 03/31/2012  . Anxiety     Past Surgical History  Procedure Laterality Date  . Cesarean section  1998   Family History  Problem Relation Age of Onset  . Diabetes Mother   . Cancer Mother     Breast  . Depression Mother   . CAD Mother   . Heart failure Father   . Stroke Father   . Alcohol abuse Father   . Alcohol abuse Brother   . Drug abuse Brother     History   Social History  . Marital Status: Married    Spouse Name: N/A    Number of Children: N/A  . Years of Education: N/A   Occupational History  . Not on file.   Social History Main Topics  . Smoking status: Never Smoker   . Smokeless tobacco: Never Used  . Alcohol Use: No  . Drug Use: No  . Sexual Activity: Yes    Partners: Male   Other Topics Concern  . Not on file   Social History Narrative  . No narrative on file     Objective: BP 127/78  Pulse 84  Temp(Src) 98.2 F (36.8 C) (Oral)  Wt 168 lb (76.204 kg)  Vital signs reviewed. General: Alert and Oriented, No Acute Distress HEENT: Pupils equal, round, reactive to light. Conjunctivae clear.  External ears unremarkable.  Moist  mucous membranes. Lungs: Clear and comfortable work of breathing, speaking in full sentences without accessory muscle use. Cardiac: Regular rate and rhythm.  Extremities: No peripheral edema.  Strong peripheral pulses.  Mental Status: No depression, anxiety, nor agitation. Logical though process. Skin: Warm and dry.  Assessment & Plan: [name] was seen today for abd discomfort.  Diagnoses and associated orders for this visit:  UTI (lower urinary tract infection) - cephALEXin (KEFLEX) 500 MG capsule; Take 1 capsule (500 mg total) by mouth 4 (four) times daily.    Urinalysis with leukocytes therefore start Keflex, checking urine culture again.     Return if symptoms worsen or fail to improve.

## 2013-11-25 NOTE — Addendum Note (Signed)
Addended by: Avon GullyMCCRIMMON, Luciana Cammarata C on: 11/25/2013 09:30 AM   Modules accepted: Orders

## 2013-11-27 LAB — URINE CULTURE
Colony Count: NO GROWTH
ORGANISM ID, BACTERIA: NO GROWTH

## 2013-12-06 ENCOUNTER — Ambulatory Visit (INDEPENDENT_AMBULATORY_CARE_PROVIDER_SITE_OTHER): Payer: BC Managed Care – PPO | Admitting: Physician Assistant

## 2013-12-06 ENCOUNTER — Encounter (HOSPITAL_COMMUNITY): Payer: Self-pay | Admitting: Physician Assistant

## 2013-12-06 VITALS — BP 120/76 | HR 77 | Ht 65.0 in | Wt 169.0 lb

## 2013-12-06 DIAGNOSIS — F331 Major depressive disorder, recurrent, moderate: Secondary | ICD-10-CM

## 2013-12-06 DIAGNOSIS — F411 Generalized anxiety disorder: Secondary | ICD-10-CM

## 2013-12-06 DIAGNOSIS — R5381 Other malaise: Secondary | ICD-10-CM

## 2013-12-06 DIAGNOSIS — F329 Major depressive disorder, single episode, unspecified: Secondary | ICD-10-CM

## 2013-12-06 DIAGNOSIS — F32A Depression, unspecified: Secondary | ICD-10-CM

## 2013-12-06 DIAGNOSIS — R5383 Other fatigue: Secondary | ICD-10-CM

## 2013-12-06 MED ORDER — BUPROPION HCL ER (SR) 100 MG PO TB12
100.0000 mg | ORAL_TABLET | Freq: Every day | ORAL | Status: DC
Start: 2013-12-06 — End: 2014-05-04

## 2013-12-06 NOTE — Progress Notes (Signed)
   [facility]  [name] [birth year]  Date: 12/06/2013    Subjective: Darlene PikesSusan comes in today to followup on her generalized anxiety disorder Maj. depressive disorder. She was unable to start the Abilify written on her last office visit as it was too expensive.  Darlene PikesSusan notes since her last visit she has become extremely fatigued, has no energy and cannot get off the couch. She endorses taking Lunesta at night going to bed at 9 PM waking up at 6 and feeling drowsy and sleepy throughout the day.  She says she has absolutely no interest in doing anything and no motivation to do anything. Filed Vitals:   12/06/13 1203  BP: 120/76  Pulse: 77   SFHx. Mental Status Examination  Appearance: well dressed neat and clean Alert: Yes Attention: good  Cooperative: Yes Eye Contact: Good Speech: clear and goal directed Psychomotor Activity: Normal Memory/Concentration: good Oriented: x 3 Mood: Anxious and Depressed Affect: Congruent Thought Processes and Associations: Goal Directed Fund of Knowledge: Good Thought Content: normal Insight: Good Judgement: Good  Diagnosis:  Generalized anxiety disorder  Maj. depressive disorder Fatigue uncertain etiology Treatment Plan:  After careful review of chart and discussion with the patient, she will discontinue the Lunesta. We will not start the Abilify due to her history of long QT syndrome. Will initiate Wellbutrin SR 150 mg each morning for depression and fatigue. We'll also start vitamin D 3 5000 international units 2 tabs per day. Patient will also decrease daytime sleeping, and make a concentrated effort to engage in focused sustaining activity throughout the day.  Followup one month.  Continue Prozac as written.  Teofilo Lupinacci, PA-C

## 2013-12-06 NOTE — Patient Instructions (Signed)
1. Stop Lunesta due to increased daytime sleepiness. 2. Add Vitamin D3 2 tablets (5000iu) each day. 3. No daytime napping, 4. Regular bedtime. 5. Follow up in 1 month. 6. Start Wellbutrin 100mg  each morning.

## 2013-12-07 NOTE — Telephone Encounter (Signed)
Please see office note completed yesterday. Patient was instructed to discontinue all sedative medication. Rona RavensNeil T. Kweli Grassel RPAC 4:30 PM 12/07/2013

## 2013-12-17 ENCOUNTER — Other Ambulatory Visit: Payer: Self-pay | Admitting: Family Medicine

## 2014-01-03 NOTE — Addendum Note (Signed)
Addended by: Chalmers Cater on: 01/03/2014 07:58 AM   Modules accepted: Orders

## 2014-01-24 ENCOUNTER — Other Ambulatory Visit: Payer: Self-pay

## 2014-01-24 MED ORDER — HYDROCHLOROTHIAZIDE 25 MG PO TABS: 25.0000 mg | ORAL_TABLET | Freq: Every day | ORAL | Status: DC

## 2014-02-20 ENCOUNTER — Emergency Department: Payer: BC Managed Care – PPO

## 2014-02-20 ENCOUNTER — Emergency Department
Admission: EM | Admit: 2014-02-20 | Discharge: 2014-02-20 | Disposition: A | Discharge status: Home / Self Care | Payer: BC Managed Care – PPO | Source: Home / Self Care | Attending: Family Medicine | Admitting: Family Medicine

## 2014-02-20 ENCOUNTER — Encounter: Payer: Self-pay | Admitting: Emergency Medicine

## 2014-02-20 DIAGNOSIS — H53122 Transient visual loss, left eye: Secondary | ICD-10-CM

## 2014-02-20 LAB — BASIC METABOLIC PANEL
BUN: 10 mg/dL (ref 6–23)
CALCIUM: 9.5 mg/dL (ref 8.4–10.5)
CO2: 28 meq/L (ref 19–32)
CREATININE: 0.84 mg/dL (ref 0.50–1.10)
Chloride: 102 mEq/L (ref 96–112)
GLUCOSE: 102 mg/dL — AB (ref 70–99)
Potassium: 3.3 mEq/L — ABNORMAL LOW (ref 3.5–5.3)
Sodium: 140 mEq/L (ref 135–145)

## 2014-02-20 LAB — SEDIMENTATION RATE: Sed Rate: 27 mm/hr — ABNORMAL HIGH (ref 0–22)

## 2014-02-20 NOTE — ED Notes (Signed)
C/o vision loss to left eye for a few minutes on Sunday.  Vision returned to normal for her without any other complaints.  She reports one previous similar episode of vision loss 2 years ago.  No diagnosis was determined.  Also c/o right anterior shoulder pain with numbness and tingling to forearm and fingers x 1 week.  No known injury.

## 2014-02-20 NOTE — ED Notes (Signed)
Patient transported to X-ray 

## 2014-02-20 NOTE — Discharge Instructions (Signed)
Thank you for coming in today. Go directly to the emergency room if you get worse I will call you if any labs become abnormal [facility] tomorrow for an appointment Follow-up with your primary care doctor ASAP   Amaurosis Fugax Amaurosis fugax is a condition in which a person loses sight in one eye. The loss of vision in the affected eye may be total or partial. It usually lasts just a few seconds or minutes. Then, it returns to normal. Occasionally, it may last for several hours. This is caused by interruption of blood flow to the artery that supplies blood to the retina (lining at the back of the eye, contains nerves needed for sight). The temporary loss of blood flow causes symptoms similar to a stroke. The family of symptoms that happen from a loss of blood flow is called a Transient Ischemic Attack (TIA, mini-stroke). In the case of amaurosis fugax, the eye is the organ that is involved. SYMPTOMS   Painless, sudden loss of vision in one eye.  Visual loss is often from the top down, appearing like a curtain being pulled down over the field of vision.  Rapid return of vision. Vision generally comes back in a few minutes to several hours. CAUSES  TIAs and amaurosis fugax are caused by a loss of blood flow. This can be due to a buildup of cholesterol and fats (plaque) in the arteries or the heart. If some of that plaque comes off the artery and gets into the bloodstream, it can flow to the artery that supplies blood to the retina, blocking the flow of blood to the retina. When that happens, vision is lost for as long as the blood flow is interrupted. Factors that make it more likely you will have amaurosis fugax at some point include:  Smoking.  Poorly controlled diabetes.  High blood pressure.  High cholesterol levels. Medical conditions that may increase the risk of an attack of amaurosis fugax include:  Heart disease.  Diseases of the heart  valves.  Certain diseases of the blood (sickle cell anemia, leukemia).  Blood clotting (coagulation) disorders.  Artery inflammation (temporal arteritis, giant cell arteritis). Since amaurosis fugax is an "incomplete stroke," in some people it can be a sign of an increased risk for an actual stroke. A stroke can result in permanent vision loss or loss of other body functions. As a result, caring for yourself after amaurosis fugax means taking many of the same steps you should take to prevent a stroke. HOME CARE INSTRUCTIONS   Only take over-the-counter or prescription medicines for pain, discomfort, or fever as directed by your caregiver.  Take any medicines that are prescribed for control of your blood pressure and cholesterol levels.  Keep diabetes under control as well as possible.  Stop smoking.  Follow diet instructions, if your caregiver has given them to you.  Try to get at least 30 minutes of moderate physical activity every day. If you have not been active, talk to your caregiver about how to get started. SEEK IMMEDIATE MEDICAL CARE IF:   You lose vision in one or both eyes again, even if only for a short period of time.  You lose vision in one eye and it does not recover within a very brief time (less than 5-10 minutes). The sooner you see an eye specialist (ophthalmologist), the better the chance of regaining some vision, in the case of a central retinal artery occlusion (CRAO, blockage of central retinal artery).  However, most cases of CRAO result in some degree of permanent visual loss, even with aggressive treatment.  You have symptoms of a stroke:  Weakness in one side of your body.  Difficulty speaking or thinking clearly.  Lack of coordination. Document Released: 01/15/2008 Document Revised: 06/30/2011 Document Reviewed: 01/15/2008 [facility] Patient Information 2015 [employer] [place]. This information is not intended to replace advice given to you by your health care  provider. Make sure you discuss any questions you have with your health care provider.

## 2014-02-20 NOTE — ED Notes (Signed)
Returns to exam room from Ultrasound.

## 2014-02-20 NOTE — ED Provider Notes (Signed)
Darlene Ochoa is a 48 y.o. female who presents to Urgent Care today for Left eye visual field loss. She was in her normal state of health yesterday when she had over the course of several minutes a curtainlike left eye visual field loss. This lasted several minutes and returned to normal. This is similar to previous episode that occurred about 2 years ago. She had extensive workup with carotid Dopplers MRIs and referral to ophthalmologist with no definitive diagnosis being made. She feels well today without any symptoms. She has not tried any medications yet. She is not wearing corrective listens currently   Past Medical History  Diagnosis Date  . Depression   . Seasonal allergies   . Hypertension   . Major depressive disorder 03/31/2012  . Chest pain 03/31/2012  . H/O prolonged Q-T interval on ECG 03/31/2012  . Anxiety    Past Surgical History  Procedure Laterality Date  . Cesarean section  1998   History  Substance Use Topics  . Smoking status: Never Smoker   . Smokeless tobacco: Never Used  . Alcohol Use: No   ROS as above Medications: No current facility-administered medications for this encounter.   Current Outpatient Prescriptions  Medication Sig Dispense Refill  . buPROPion (WELLBUTRIN SR) 100 MG 12 hr tablet Take 1 tablet (100 mg total) by mouth daily. 60 tablet 2  . cephALEXin (KEFLEX) 500 MG capsule Take 1 capsule (500 mg total) by mouth 4 (four) times daily. 40 capsule 0  . eszopiclone (LUNESTA) 1 MG TABS tablet Take 1 tablet (1 mg total) by mouth at bedtime as needed for sleep. Take immediately before bedtime 30 tablet 1  . FLUoxetine (PROZAC) 40 MG capsule Take 2 capsules (80 mg total) by mouth daily. 60 capsule 2  . hydrochlorothiazide (HYDRODIURIL) 25 MG tablet Take 1 tablet (25 mg total) by mouth daily. 30 tablet 0  . ibuprofen (ADVIL,MOTRIN) 200 MG tablet Take 200 mg by mouth every 6 (six) hours as needed.    . loratadine (CLARITIN) 10 MG tablet Take 10 mg by mouth  daily.    Marland Kitchen MINASTRIN 24 FE 1-20 MG-MCG(24) CHEW Chew 1 tablet by mouth Daily.    . SUMAtriptan (IMITREX) 100 MG tablet Take 50 mg by mouth as needed.     Allergies  Allergen Reactions  . Codeine   . Doxycycline Nausea And Vomiting  . Lactose Intolerance (Gi)   . Peanut-Containing Drug Products   . Penicillins   . Pork Allergy   . Sulfonamide Derivatives   . Trazodone And Nefazodone     Prolonged QTC     Exam:  BP 118/79 mmHg  Pulse 85  Temp(Src) 98.2 F (36.8 C) (Oral)  Resp 16  Ht '5\' 5"'  (1.651 m)  Wt 165 lb (74.844 kg)  BMI 27.46 kg/m2  SpO2 99% Gen: Well NAD HEENT: EOMI,  MMM, PERRLA Normal funduscopic exam bilaterally. No carotid bruits.  Lungs: Normal work of breathing. CTABL Heart: RRR no MRG Abd: NABS, Soft. Nondistended, Nontender Exts: Brisk capillary refill, warm and well perfused.   Visual Acuity: Bilaterally 20/25 Right 20/40 Left 20/40 Testing done without corrective lenses  Twelve-lead EKG shows normal sinus rhythm at 72 bpm. No ST segment elevation or depression. QTC 435.    Results for orders placed or performed during the hospital encounter of 02/20/14 (from the past 24 hour(s))  Basic metabolic panel     Status: Abnormal   Collection Time: 02/20/14 11:51 AM  Result Value Ref Range  Sodium 140 135 - 145 mEq/L   Potassium 3.3 (L) 3.5 - 5.3 mEq/L   Chloride 102 96 - 112 mEq/L   CO2 28 19 - 32 mEq/L   Glucose, Bld 102 (H) 70 - 99 mg/dL   BUN 10 6 - 23 mg/dL   Creat 0.84 0.50 - 1.10 mg/dL   Calcium 9.5 8.4 - 10.5 mg/dL   Narrative   Performed at:  [facility]                [address] [place]                [place], [place] [address]   US Carotid Duplex Bilateral  02/20/2014   CLINICAL DATA:  Transient loss of vision to left eye  EXAM: BILATERAL CAROTID DUPLEX ULTRASOUND  TECHNIQUE: Pearline Cables scale imaging, color Doppler and duplex ultrasound were performed of bilateral carotid and vertebral arteries in the neck.   COMPARISON:  None.  FINDINGS: Criteria: Quantification of carotid stenosis is based on velocity parameters that correlate the residual internal carotid diameter with NASCET-based stenosis levels, using the diameter of the distal internal carotid lumen as the denominator for stenosis measurement.  The following velocity measurements were obtained:  RIGHT  ICA:  99 cm/sec  CCA:  88 cm/sec  SYSTOLIC ICA/CCA RATIO:  1.1  DIASTOLIC ICA/CCA RATIO:  ECA:  82 cm/sec  LEFT  ICA:  94 cm/sec  CCA:  75 cm/sec  SYSTOLIC ICA/CCA RATIO:  1.3  DIASTOLIC ICA/CCA RATIO:  ECA:  70 cm/sec  RIGHT CAROTID ARTERY: Little if any plaque in the bulb. Low resistance internal carotid Doppler pattern.  RIGHT VERTEBRAL ARTERY:  Antegrade.  Normal Doppler pattern  LEFT CAROTID ARTERY: Little if any plaque in the bulb. Low resistance internal carotid Doppler pattern.  LEFT VERTEBRAL ARTERY:  Antegrade with a normal Doppler pattern.  IMPRESSION: Less than 50% stenosis in the right and left internal carotid arteries.   Electronically Signed   By: Maryclare Bean M.D.   On: 02/20/2014 14:03    Assessment and Plan: 48 y.o. female with Amaurosis fugax of the left eye. Patient had a similar history 2 years ago with a reassuring workup.  She is currently in a normal state of health with no complaints. Discussed options with patient. She would like to avoid hospitalization if possible. I feel it is reasonable to pursue an outpatient workup. As noted above carotid Doppler assessment is normal as is basic metabolic panel. ESR and CRP are pending.  Patient is scheduled to see an ophthalmologist tomorrow for [facility] ophthalmology. If all tests are normal would recommend prompt follow-up with PCP. Discussed in detail precaution and risk factors and warning signs and symptoms  Discussed warning signs or symptoms. Please see discharge instructions. Patient expresses understanding.     Gregor Hams, MD 02/20/14 [phone]

## 2014-02-22 ENCOUNTER — Telehealth (HOSPITAL_COMMUNITY): Payer: Self-pay | Admitting: Family Medicine

## 2014-02-22 LAB — C-REACTIVE PROTEIN: CRP: 0.5 mg/dL (ref <0.60)

## 2014-02-22 NOTE — ED Notes (Signed)
Called patient to discuss lab results and progress.  She is doing well.  She has not yet been seen by the ophthalmologist. I recommend that she call and schedule an appointment ASAP.  I also recommend that she call her PCP office and be seen this week to complete testing including a MRI/A of the brain and possibly an ECHO and lab work.  She expressed understanding and will make calls today.  Rodolph BongEvan S Mayrene Bastarache, MD 02/22/14 [phone]

## 2014-03-02 ENCOUNTER — Encounter: Payer: Self-pay | Admitting: Family Medicine

## 2014-03-02 ENCOUNTER — Ambulatory Visit (INDEPENDENT_AMBULATORY_CARE_PROVIDER_SITE_OTHER): Payer: BC Managed Care – PPO | Admitting: Family Medicine

## 2014-03-02 VITALS — BP 135/92 | HR 88 | Wt 166.0 lb

## 2014-03-02 DIAGNOSIS — F411 Generalized anxiety disorder: Secondary | ICD-10-CM

## 2014-03-02 MED ORDER — CLONAZEPAM 0.5 MG PO TABS: 0.5000 mg | ORAL_TABLET | Freq: Every day | ORAL | Status: DC | PRN

## 2014-03-02 NOTE — Progress Notes (Signed)
CC: Darlene NicksSusan Ochoa is a 48 y.o. female is here for Anxiety   Subjective: HPI:  Patient complains of anxiety, restlessness, a sensation that her throat is swelling producing difficulty swallowing, and tremor that has been present for the past few weeks. Symptoms are particularly worse when thinking about her mother, confronting her mother, or focusing on her mother's mental decline. Nothing particularly makes symptoms better other than she had issues like this in the past and it improved with Prozac and later Wellbutrin. There've been no changes to her medications recently. Other than above nothing seems to make symptoms better or worse. She has a list of behaviors that her mother is exhibiting and how they cause different degrees of the above combination of complaints. Symptoms seem to occur on a daily basis but most problematic first thing in the morning and when thinking about going to visit her mother in the evenings. Symptoms are severe in severity.  She had an episode about a week ago where she had complete vision loss in her left eye for a few minutes. She had identical symptoms 2 years ago when she was suffering from a similar state of anxiety and stress. She denies any other recent motor or sensory disturbances. She is asking for help today because she doesn't control her anxiety and how her mother's status directly influencing this.   Review Of Systems Outlined In HPI  Past Medical History  Diagnosis Date  . Depression   . Seasonal allergies   . Hypertension   . Major depressive disorder 03/31/2012  . Chest pain 03/31/2012  . H/O prolonged Q-T interval on ECG 03/31/2012  . Anxiety     Past Surgical History  Procedure Laterality Date  . Cesarean section  1998   Family History  Problem Relation Age of Onset  . Diabetes Mother   . Cancer Mother     Breast  . Depression Mother   . CAD Mother   . Heart failure Father   . Stroke Father   . Alcohol abuse Father   . Alcohol abuse  Brother   . Drug abuse Brother     History   Social History  . Marital Status: Married    Spouse Name: N/A    Number of Children: N/A  . Years of Education: N/A   Occupational History  . Not on file.   Social History Main Topics  . Smoking status: Never Smoker   . Smokeless tobacco: Never Used  . Alcohol Use: No  . Drug Use: No  . Sexual Activity:    Partners: Male   Other Topics Concern  . Not on file   Social History Narrative     Objective: BP 135/92 mmHg  Pulse 88  Wt 166 lb (75.297 kg)  Vital signs reviewed. General: Alert and Oriented, No Acute Distress HEENT: Pupils equal, round, reactive to light. Conjunctivae clear.  External ears unremarkable.  Moist mucous membranes. Lungs: Clear and comfortable work of breathing, speaking in full sentences without accessory muscle use. Cardiac: Regular rate and rhythm.  Neuro: CN II-XII grossly intact, gait normal. Extremities: No peripheral edema.  Strong peripheral pulses.  Mental Status: No depression,  nor agitation. Logical though process.mild anxiety. Skin: Warm and dry.  Assessment & Plan: Darlene Ochoa was seen today for anxiety.  Diagnoses and associated orders for this visit:  Anxiety state - clonazePAM (KLONOPIN) 0.5 MG tablet; Take 1 tablet (0.5 mg total) by mouth daily as needed for anxiety (Until new medication plan discussed with  behavorial health.).    Over 40 minutes was spent listening to her concerns, symptoms, and discussing a plan for her to Darlene Ochoa with the stress of her mother and what the 2 of Darlene Ochoa can do in the future to help with her mother in order to help Darlene Ochoa better cope with stress.  Have advised her that it's best for her behavior health team, particularly Darlene Ochoa, to ultimately be in charge of her psychiatric medications. I provided her with a low dose of clonazepam with the understanding that this is not the solution and rather this is a "patch"so to speak until her behavior health team can  possibly offer other recommendations for her stress. I have also asked her to bring in her mother in the near future so that we can discuss assisted living placement which the tumor was both think would help Darlene Ochoa from a psychiatric standpoint.at this time I believe her anxiety is contributing significantly to somatic complaints.  40 minutes spent face-to-face during visit today of which at least 50% was counseling or coordinating care regarding: 1. Anxiety state      Return if symptoms worsen or fail to improve.

## 2014-04-10 ENCOUNTER — Other Ambulatory Visit: Payer: Self-pay | Admitting: Family Medicine

## 2014-04-10 NOTE — Telephone Encounter (Signed)
Darlene Ochoa, Rx placed in in-box ready for pickup/faxing.  

## 2014-04-17 ENCOUNTER — Encounter: Payer: Self-pay | Admitting: Family Medicine

## 2014-04-17 ENCOUNTER — Ambulatory Visit (INDEPENDENT_AMBULATORY_CARE_PROVIDER_SITE_OTHER): Payer: BC Managed Care – PPO | Admitting: Family Medicine

## 2014-04-17 VITALS — BP 125/82 | HR 92 | Temp 98.7°F | Wt 166.0 lb

## 2014-04-17 DIAGNOSIS — R300 Dysuria: Secondary | ICD-10-CM

## 2014-04-17 DIAGNOSIS — N390 Urinary tract infection, site not specified: Secondary | ICD-10-CM

## 2014-04-17 LAB — POCT URINALYSIS DIPSTICK
Bilirubin, UA: NEGATIVE
GLUCOSE UA: NEGATIVE
KETONES UA: NEGATIVE
Nitrite, UA: POSITIVE
Protein, UA: NEGATIVE
Spec Grav, UA: 1.015
Urobilinogen, UA: 0.2
pH, UA: 6

## 2014-04-17 MED ORDER — CIPROFLOXACIN HCL 500 MG PO TABS: 500.0000 mg | ORAL_TABLET | Freq: Two times a day (BID) | ORAL | Status: DC

## 2014-04-17 NOTE — Progress Notes (Signed)
CC: Darlene NicksSusan Ochoa is a 48 y.o. female is here for Urinary Tract Infection   Subjective: HPI:  Urinary urgency and frequency with mild dysuria that has been present since December 26. Present on a daily basis moderate in severity up until this morning when first reasons unknown to her it became only mild in severity. Interventions have included increasing fluid intake. Review of systems positive for right and left flank pain. Denies fevers, chills, nausea, abdominal pain, nor any other genitourinary complaints   Review Of Systems Outlined In HPI  Past Medical History  Diagnosis Date  . Depression   . Seasonal allergies   . Hypertension   . Major depressive disorder 03/31/2012  . Chest pain 03/31/2012  . H/O prolonged Q-T interval on ECG 03/31/2012  . Anxiety     Past Surgical History  Procedure Laterality Date  . Cesarean section  1998   Family History  Problem Relation Age of Onset  . Diabetes Mother   . Cancer Mother     Breast  . Depression Mother   . CAD Mother   . Heart failure Father   . Stroke Father   . Alcohol abuse Father   . Alcohol abuse Brother   . Drug abuse Brother     History   Social History  . Marital Status: Married    Spouse Name: N/A    Number of Children: N/A  . Years of Education: N/A   Occupational History  . Not on file.   Social History Main Topics  . Smoking status: Never Smoker   . Smokeless tobacco: Never Used  . Alcohol Use: No  . Drug Use: No  . Sexual Activity:    Partners: Male   Other Topics Concern  . Not on file   Social History Narrative     Objective: BP 125/82 mmHg  Pulse 92  Temp(Src) 98.7 F (37.1 C) (Oral)  Wt 166 lb (75.297 kg)  Vital signs reviewed. General: Alert and Oriented, No Acute Distress HEENT: Pupils equal, round, reactive to light. Conjunctivae clear.  External ears unremarkable.  Moist mucous membranes. Lungs: Clear and comfortable work of breathing, speaking in full sentences without accessory  muscle use. Cardiac: Regular rate and rhythm.  Back: No CVA tenderness Extremities: No peripheral edema.  Strong peripheral pulses.  Mental Status: No depression, anxiety, nor agitation. Logical though process. Skin: Warm and dry.  Assessment & Plan: [name] was seen today for urinary tract infection.  Diagnoses and associated orders for this visit:  Dysuria - Urinalysis Dipstick  UTI (lower urinary tract infection) - ciprofloxacin (CIPRO) 500 MG tablet; Take 1 tablet (500 mg total) by mouth 2 (two) times daily. - Urine culture     urinalysis with nitrites. I'm going to give her 500 mg of Cipro instead of 250 mg given her report of flank pain and possibility of pyelonephritis. Exam reassuring.Signs and symptoms requring emergent/urgent reevaluation were discussed with the patient.   Return if symptoms worsen or fail to improve.

## 2014-04-20 LAB — URINE CULTURE: Colony Count: >=100000

## 2014-04-26 ENCOUNTER — Telehealth (HOSPITAL_COMMUNITY): Payer: Self-pay | Admitting: *Deleted

## 2014-04-26 NOTE — Telephone Encounter (Signed)
Pt needs script for Prozac.

## 2014-04-26 NOTE — Telephone Encounter (Signed)
Declined due to non compliance.  She will need to keep her appointment and will discuss it then. Rona RavensNeil T. Quinterius Gaida RPAC 5:31 PM 04/26/2014

## 2014-04-27 NOTE — Telephone Encounter (Signed)
Called informed pt script request for Prozac decline by Verne SpurrNeil mashburn, PA-C.  PT has appt 1/15.

## 2014-04-28 ENCOUNTER — Other Ambulatory Visit (HOSPITAL_COMMUNITY): Payer: Self-pay | Admitting: *Deleted

## 2014-04-28 ENCOUNTER — Telehealth (HOSPITAL_COMMUNITY): Payer: Self-pay

## 2014-04-28 DIAGNOSIS — F331 Major depressive disorder, recurrent, moderate: Secondary | ICD-10-CM

## 2014-04-28 MED ORDER — FLUOXETINE HCL 40 MG PO CAPS
80.0000 mg | ORAL_CAPSULE | Freq: Every day | ORAL | Status: DC
Start: 2014-04-28 — End: 2014-05-04

## 2014-04-28 NOTE — Telephone Encounter (Signed)
Per Verne SpurrNeil Mashburn, PA-C, pt is authorized for 7 days supply of Prozac 40mg , Qty 14. Sent to pharmacy. Called and informed pt prescription is only for 7 days and appt is 1/15. Pt states and shows understanding.

## 2014-04-28 NOTE — Telephone Encounter (Signed)
PT needs a script for Prozac to get her through till her appt on the 15th. Ok'd by Lloyd HugerNeil already

## 2014-04-28 NOTE — Telephone Encounter (Signed)
Agreed with refill as patient stated that she had been on Prozac for the duration since her last visit.Rona RavensNeil T. Fredrick Dray RPAC 4:06 PM 04/28/2014

## 2014-05-04 ENCOUNTER — Ambulatory Visit (INDEPENDENT_AMBULATORY_CARE_PROVIDER_SITE_OTHER): Payer: BLUE CROSS/BLUE SHIELD | Admitting: Physician Assistant

## 2014-05-04 ENCOUNTER — Encounter (HOSPITAL_COMMUNITY): Payer: Self-pay | Admitting: Physician Assistant

## 2014-05-04 ENCOUNTER — Encounter (INDEPENDENT_AMBULATORY_CARE_PROVIDER_SITE_OTHER): Payer: Self-pay

## 2014-05-04 VITALS — BP 136/80 | HR 81 | Ht 65.0 in | Wt 164.0 lb

## 2014-05-04 DIAGNOSIS — F32A Depression, unspecified: Secondary | ICD-10-CM

## 2014-05-04 DIAGNOSIS — F329 Major depressive disorder, single episode, unspecified: Secondary | ICD-10-CM

## 2014-05-04 DIAGNOSIS — F411 Generalized anxiety disorder: Secondary | ICD-10-CM

## 2014-05-04 DIAGNOSIS — F331 Major depressive disorder, recurrent, moderate: Secondary | ICD-10-CM

## 2014-05-04 MED ORDER — FLUOXETINE HCL 40 MG PO CAPS: 80.0000 mg | ORAL_CAPSULE | Freq: Every day | ORAL | Status: DC

## 2014-05-04 MED ORDER — BUPROPION HCL ER (SR) 100 MG PO TB12: 100.0000 mg | ORAL_TABLET | Freq: Every day | ORAL | Status: DC

## 2014-05-04 NOTE — Progress Notes (Signed)
   [facility] Follow-up Outpatient Visit  Lacretia NicksSusan Carmickle 1965/09/18  Date: 05/04/2014    Subjective:  Patient is in to follow up on her GAD and MDD, she did not keep her follow up appointment last September, but she notes that she is much better than in the past now, as she has moved her mother into an assisted living facility. This is the major stressor in her life as her mother is now getting care 24/7 in a locked memory unit.  [name] feels a bit guilty of the move, but realizes the need to make this change and how much it has helped. She now feels that she has her life back, and especially time with her family.  [name] rates her depression as a 3/10 and her anxiety as a 4/10, as this is all new to her. But she is coping well.  Filed Vitals:   05/04/14 1030  BP: 136/80  Pulse: 81   SFHx. Mental Status Examination  Appearance: well dressed neat and clean Alert: Yes Attention: good  Cooperative: Yes Eye Contact: Good Speech: clear and goal directed Psychomotor Activity: Normal Memory/Concentration: good Oriented: x 3 Mood: Anxious and Depressed Affect: Congruent Thought Processes and Associations: Goal Directed Fund of Knowledge: Good Thought Content: normal Insight: Good Judgement: Good  Diagnosis:  Generalized anxiety disorder  Maj. depressive disorder Fatigue uncertain etiology Treatment Plan:   Teya Otterson, PA-C

## 2014-05-05 ENCOUNTER — Ambulatory Visit (HOSPITAL_COMMUNITY): Payer: Self-pay | Admitting: Physician Assistant

## 2014-05-13 ENCOUNTER — Other Ambulatory Visit: Payer: Self-pay | Admitting: Family Medicine

## 2014-05-15 NOTE — Telephone Encounter (Signed)
F/u appt needed before future refills 

## 2014-06-22 ENCOUNTER — Other Ambulatory Visit: Payer: Self-pay | Admitting: Family Medicine

## 2014-07-17 ENCOUNTER — Other Ambulatory Visit: Payer: Self-pay | Admitting: Family Medicine

## 2014-07-31 ENCOUNTER — Encounter: Payer: Self-pay | Admitting: Family Medicine

## 2014-07-31 ENCOUNTER — Other Ambulatory Visit (HOSPITAL_COMMUNITY): Payer: Self-pay | Admitting: Physician Assistant

## 2014-07-31 ENCOUNTER — Ambulatory Visit (INDEPENDENT_AMBULATORY_CARE_PROVIDER_SITE_OTHER): Payer: BLUE CROSS/BLUE SHIELD | Admitting: Family Medicine

## 2014-07-31 VITALS — BP 133/92 | HR 78 | Wt 167.0 lb

## 2014-07-31 DIAGNOSIS — A499 Bacterial infection, unspecified: Secondary | ICD-10-CM

## 2014-07-31 DIAGNOSIS — I10 Essential (primary) hypertension: Secondary | ICD-10-CM | POA: Diagnosis not present

## 2014-07-31 DIAGNOSIS — B9689 Other specified bacterial agents as the cause of diseases classified elsewhere: Secondary | ICD-10-CM

## 2014-07-31 DIAGNOSIS — R5383 Other fatigue: Secondary | ICD-10-CM

## 2014-07-31 DIAGNOSIS — J329 Chronic sinusitis, unspecified: Secondary | ICD-10-CM | POA: Diagnosis not present

## 2014-07-31 MED ORDER — HYDROCHLOROTHIAZIDE 25 MG PO TABS: 25.0000 mg | ORAL_TABLET | Freq: Every day | ORAL | Status: DC

## 2014-07-31 MED ORDER — CEFDINIR 300 MG PO CAPS: 300.0000 mg | ORAL_CAPSULE | Freq: Two times a day (BID) | ORAL | Status: AC

## 2014-07-31 NOTE — Progress Notes (Signed)
CC: Darlene Ochoa is a 49 y.o. female is here for Hypertension and Allergies   Subjective: HPI:  Has been off of hydrochlorothiazide for a full week now. Ran out of medication but denies any intolerance while on the medication. She tells me that other than complaints below she does not feel any better or worse since stopping the medication and that the conditions below did not start or worsen around the time that she ran out of the medication. No chest pain shortness of breath orthopnea or peripheral edema nor motor or sensory disturbances other than that described below  Complaints of fatigue that has been present for the last 2 weeks. She describes it as it poor motivation for doing a task other than simple household chores and taking care of her children. She describes that when she gets home from dropping her children off to school she could take a nap at any minute. She denies nonrestorative sleep or falling asleep behind the wheel. She denies any shortness of breath or weakness per se. She denies any changes to her medication regimen recently. She denies any mental disturbance such as depression or anxiety. Symptoms are present to moderate degree on a daily basis anytime of the day but mostly in the afternoon.  Complains of facial pressure localized beneath both eyes with nasal congestion and postnasal drip. Occasional cough. No benefit from anti-histamines but did improve one day with pseudoephedrine. No other interventions as of yet. Ears feel full and worse with quick moving of the head. Denies fevers, chills, shortness of breath, wheezing, confusion, nor sore throat.   Review Of Systems Outlined In HPI  Past Medical History  Diagnosis Date  . Depression   . Seasonal allergies   . Hypertension   . Major depressive disorder 03/31/2012  . Chest pain 03/31/2012  . H/O prolonged Q-T interval on ECG 03/31/2012  . Anxiety     Past Surgical History  Procedure Laterality Date  . Cesarean  section  1998   Family History  Problem Relation Age of Onset  . Diabetes Mother   . Cancer Mother     Breast  . Depression Mother   . CAD Mother   . Heart failure Father   . Stroke Father   . Alcohol abuse Father   . Alcohol abuse Brother   . Drug abuse Brother     History   Social History  . Marital Status: Married    Spouse Name: N/A  . Number of Children: N/A  . Years of Education: N/A   Occupational History  . Not on file.   Social History Main Topics  . Smoking status: Never Smoker   . Smokeless tobacco: Never Used  . Alcohol Use: No  . Drug Use: No  . Sexual Activity:    Partners: Male   Other Topics Concern  . Not on file   Social History Narrative     Objective: BP 133/92 mmHg  Pulse 78  Wt 167 lb (75.751 kg)  General: Alert and Oriented, No Acute Distress HEENT: Pupils equal, round, reactive to light. Conjunctivae clear.  External ears unremarkable, canals clear with intact TMs with appropriate landmarks. Right Middle ear appears open without effusion, left middle ear with a mild serous effusion. Pink inferior turbinates.  Moist mucous membranes, pharynx without inflammation nor lesions.  Neck supple without palpable lymphadenopathy nor abnormal masses. Lungs: Clear to auscultation bilaterally, no wheezing/ronchi/rales.  Comfortable work of breathing. Good air movement. Cardiac: Regular rate and rhythm.  Extremities: No peripheral edema.  Strong peripheral pulses.  Mental Status: No depression, anxiety, nor agitation. Skin: Warm and dry.  Assessment & Plan: Darlene Ochoa was seen today for hypertension and allergies.  Diagnoses and all orders for this visit:  Essential hypertension Orders: -     hydrochlorothiazide (HYDRODIURIL) 25 MG tablet; Take 1 tablet (25 mg total) by mouth daily.  Other fatigue Orders: -     Vit D  25 hydroxy (rtn osteoporosis monitoring) -     Vitamin B12 -     TSH  Bacterial sinusitis  Other orders -     cefdinir  (OMNICEF) 300 MG capsule; Take 1 capsule (300 mg total) by mouth 2 (two) times daily.   Essential hypertension: Uncontrolled, restart prior dose of hydrochlorothiazide which was very effective for blood pressure control Fatigue: Rule out vitamin D and B12 deficiency. Rule out hypothyroidism. Time was taken to discuss the stress that her mother has brought onto her and how this might be contributing to her fatigue Bacterial sinusitis: Start Omnicef, she's tolerated Keflex in the past therefore low suspicion of allergy when considering her penicillin allergy 40 minutes spent face-to-face during visit today of which at least 50% was counseling or coordinating care regarding: 1. Essential hypertension   2. Other fatigue   3. Bacterial sinusitis      Return if symptoms worsen or fail to improve.

## 2014-08-01 ENCOUNTER — Telehealth: Payer: Self-pay | Admitting: Family Medicine

## 2014-08-01 DIAGNOSIS — E538 Deficiency of other specified B group vitamins: Secondary | ICD-10-CM

## 2014-08-01 DIAGNOSIS — E559 Vitamin D deficiency, unspecified: Secondary | ICD-10-CM

## 2014-08-01 LAB — TSH: TSH: 1.018 u[IU]/mL (ref 0.350–4.500)

## 2014-08-01 LAB — VITAMIN B12: VITAMIN B 12: 192 pg/mL — AB (ref 211–911)

## 2014-08-01 LAB — VITAMIN D 25 HYDROXY (VIT D DEFICIENCY, FRACTURES): Vit D, 25-Hydroxy: 22 ng/mL — ABNORMAL LOW (ref 30–100)

## 2014-08-01 MED ORDER — VITAMIN B-12 1000 MCG PO TABS: 1000.0000 ug | ORAL_TABLET | Freq: Every day | ORAL | Status: DC

## 2014-08-01 MED ORDER — VITAMIN D (ERGOCALCIFEROL) 1.25 MG (50000 UNIT) PO CAPS: 50000.0000 [IU] | ORAL_CAPSULE | ORAL | Status: DC

## 2014-08-01 NOTE — Telephone Encounter (Signed)
Sue Lushndrea, Will you please let patient know that her Vitamin D and B12 levels were both in the deficient range that can contribute to her fatigue.  I've sent an Rx of a weekly Vitamin D supplement and daily B12 supplement [facility].  If she ends up having to buy the B12 supplement OTC the dose would be 1000 micrograms daily. Return for a recheck in one month.

## 2014-08-02 ENCOUNTER — Telehealth (HOSPITAL_COMMUNITY): Payer: Self-pay | Admitting: *Deleted

## 2014-08-02 DIAGNOSIS — F331 Major depressive disorder, recurrent, moderate: Secondary | ICD-10-CM

## 2014-08-02 MED ORDER — FLUOXETINE HCL 40 MG PO CAPS: 80.0000 mg | ORAL_CAPSULE | Freq: Every day | ORAL | Status: DC

## 2014-08-02 NOTE — Telephone Encounter (Signed)
Received a fax from pharmacy for a refill for Prozac 40mg . Per Verne SpurrNeil Mashburn, PA-C, pt is authorized for a 7 day supply of Prozac 40mg , Qty 14. Prescription was sent to pharmacy. Pt has f/u appt on 4/20. Called and informed pt of prescription status. Pt states and shows understanding.

## 2014-08-02 NOTE — Telephone Encounter (Signed)
Left a message for a return call.

## 2014-08-02 NOTE — Telephone Encounter (Signed)
Patient advised of results and recommendations.  

## 2014-08-03 ENCOUNTER — Ambulatory Visit (HOSPITAL_COMMUNITY): Payer: Self-pay | Admitting: Physician Assistant

## 2014-08-05 ENCOUNTER — Other Ambulatory Visit: Payer: Self-pay | Admitting: Family Medicine

## 2014-08-09 ENCOUNTER — Encounter (HOSPITAL_COMMUNITY): Payer: Self-pay | Admitting: Physician Assistant

## 2014-08-09 ENCOUNTER — Ambulatory Visit (INDEPENDENT_AMBULATORY_CARE_PROVIDER_SITE_OTHER): Payer: BLUE CROSS/BLUE SHIELD | Admitting: Physician Assistant

## 2014-08-09 ENCOUNTER — Other Ambulatory Visit (HOSPITAL_COMMUNITY): Payer: Self-pay | Admitting: Physician Assistant

## 2014-08-09 VITALS — BP 123/90 | HR 84 | Wt 168.0 lb

## 2014-08-09 DIAGNOSIS — F329 Major depressive disorder, single episode, unspecified: Secondary | ICD-10-CM | POA: Diagnosis not present

## 2014-08-09 DIAGNOSIS — F411 Generalized anxiety disorder: Secondary | ICD-10-CM

## 2014-08-09 DIAGNOSIS — F331 Major depressive disorder, recurrent, moderate: Secondary | ICD-10-CM

## 2014-08-09 DIAGNOSIS — E538 Deficiency of other specified B group vitamins: Secondary | ICD-10-CM

## 2014-08-09 DIAGNOSIS — E559 Vitamin D deficiency, unspecified: Secondary | ICD-10-CM

## 2014-08-09 DIAGNOSIS — F32A Depression, unspecified: Secondary | ICD-10-CM

## 2014-08-09 MED ORDER — FLUOXETINE HCL 40 MG PO CAPS: 80.0000 mg | ORAL_CAPSULE | Freq: Every day | ORAL | Status: DC

## 2014-08-09 MED ORDER — BUPROPION HCL ER (SR) 150 MG PO TB12: 150.0000 mg | ORAL_TABLET | Freq: Every day | ORAL | Status: DC

## 2014-08-09 NOTE — Patient Instructions (Signed)
1. Take all of your medications as discussed with your provider. (Please check your AVS, for the list.) 2. Call this office for any questions or problems. 3. Be sure to get plenty of rest and try for 7-9 hours of quality sleep each night. 4. Try to get regular exercise, at least 15-30 minutes each day.  A good walk will help tremendously! 5. Remember to do your mindfulness each day, breath deeply in and out, while having quiet reflection, prayer, meditation, or positive visualization. Unplug and turn off all electronic devices each day for your own personal time without interruption. This works! There are studies to back this up! 6. Be sure to take your B complex and Vitamin D3 each day. This will improve your overall wellbeing and boost your immune system as well. 7. Try to eat a nutritious healthy diet and avoid excessive alcohol and ALL tobacco products. 8. Be sure to keep all of your appointments with your outpatient therapist. If you do not have one, our office will be happy to assist you with this. 9. Be sure to keep your next follow up appointment in 3 weeks. 

## 2014-08-09 NOTE — Progress Notes (Signed)
   [facility] Follow-up Outpatient Visit  Lacretia NicksSusan Fossum 03-28-1966  Date: 08/09/2014    Subjective:  Patient is in to follow up on her depression and anxiety. She notes extreme fatigue and malaise and recently had lab work done which does show vitamin D deficiency as well as B 12 deficiency. She is now on supplements for both for the past week.    BP 123/90 mmHg  Pulse 84  Wt 168 lb (76.204 kg)  SFHx. She has a new Development worker, communitypuppy Oliver. Mental Status Examination  Appearance: well dressed neat and clean Alert: Yes Attention: good  Cooperative: Yes Eye Contact: Good Speech: clear and goal directed Psychomotor Activity: Normal Memory/Concentration: good Oriented: x 3 Mood: Anxious and Depressed frustrated due to fatigue. Affect: Congruent Thought Processes and Associations: Goal Directed Fund of Knowledge: Good Thought Content: normal Insight: Good Judgement: Good  Diagnosis:  Generalized anxiety disorder  Maj. depressive disorder Fatigue uncertain etiology Treatment Plan:  1. Will increase Wellbutrin to 150mg  in AM and continue prozac. 2. Encouraged patience and self tolerance for legitimate fatigue. Rona RavensNeil T. Braelee Herrle RPAC 12:26 PM 08/09/2014

## 2014-08-10 ENCOUNTER — Other Ambulatory Visit (HOSPITAL_COMMUNITY): Payer: Self-pay | Admitting: Physician Assistant

## 2014-08-14 ENCOUNTER — Telehealth (HOSPITAL_COMMUNITY): Payer: Self-pay | Admitting: *Deleted

## 2014-08-14 DIAGNOSIS — F331 Major depressive disorder, recurrent, moderate: Secondary | ICD-10-CM

## 2014-08-14 MED ORDER — FLUOXETINE HCL 40 MG PO CAPS: 80.0000 mg | ORAL_CAPSULE | Freq: Every day | ORAL | Status: DC

## 2014-08-14 NOTE — Telephone Encounter (Signed)
Pt called for a refill for Prozac 80mg . Prescription was sent to pharmacy on 4/20 for a week supply. Per Verne SpurrNeil Mashburn, PA-C, pt is authorized for a refill for Prozac 80mg , Qty 60.  Prescription was sent to pharmacy. Pt has a f/u appt on 5/4. Pt was informed prescription is unable to be filled before 4/27. Pt states and shows understanding.

## 2014-08-16 ENCOUNTER — Other Ambulatory Visit (HOSPITAL_COMMUNITY): Payer: Self-pay | Admitting: Physician Assistant

## 2014-08-23 ENCOUNTER — Ambulatory Visit (INDEPENDENT_AMBULATORY_CARE_PROVIDER_SITE_OTHER): Payer: BLUE CROSS/BLUE SHIELD | Admitting: Physician Assistant

## 2014-08-23 ENCOUNTER — Encounter (HOSPITAL_COMMUNITY): Payer: Self-pay | Admitting: Physician Assistant

## 2014-08-23 VITALS — BP 118/70 | HR 72 | Ht 65.0 in | Wt 168.0 lb

## 2014-08-23 DIAGNOSIS — R5383 Other fatigue: Secondary | ICD-10-CM

## 2014-08-23 DIAGNOSIS — F331 Major depressive disorder, recurrent, moderate: Secondary | ICD-10-CM

## 2014-08-23 DIAGNOSIS — F411 Generalized anxiety disorder: Secondary | ICD-10-CM | POA: Diagnosis not present

## 2014-08-23 DIAGNOSIS — F322 Major depressive disorder, single episode, severe without psychotic features: Secondary | ICD-10-CM

## 2014-08-23 DIAGNOSIS — E538 Deficiency of other specified B group vitamins: Secondary | ICD-10-CM

## 2014-08-23 DIAGNOSIS — K140 Glossitis: Secondary | ICD-10-CM

## 2014-08-23 DIAGNOSIS — R202 Paresthesia of skin: Secondary | ICD-10-CM

## 2014-08-23 DIAGNOSIS — E559 Vitamin D deficiency, unspecified: Secondary | ICD-10-CM

## 2014-08-23 DIAGNOSIS — F329 Major depressive disorder, single episode, unspecified: Secondary | ICD-10-CM

## 2014-08-23 DIAGNOSIS — F32A Depression, unspecified: Secondary | ICD-10-CM

## 2014-08-23 MED ORDER — BUPROPION HCL ER (SR) 200 MG PO TB12: 200.0000 mg | ORAL_TABLET | Freq: Every day | ORAL | Status: DC

## 2014-08-23 NOTE — Progress Notes (Signed)
   [facility] Follow-up Outpatient Visit  Lacretia NicksSusan Deckard July 08, 1965  Date: 08/23/2014    Subjective:      Patient continues to report fatigue, no better, states she only has energy later in the day. She is frustrated that she can't really do anything she wants to do. She reports that she now has a tongue that is swelling, she has tingling in her left arm. States it feels that her hands feel asleep.       As for her depression she notes frustration, irritability, fatigue, anhedonia with no improvement in her symptoms since her increase in Wellbutrin.  BP 118/70 mmHg  Pulse 72  Ht 5\' 5"  (1.651 m)  Wt 168 lb (76.204 kg)  BMI 27.96 kg/m2  SpO2 98%  ROS: C/o of fatigue with poor quality of life. ENT: + for glossitis to tongue Resp: + short of breath on exertion COR: + palpitations and chest pounding on exertion GI: + nausea, -dark stools, -BRBPR GU: + taking hormones MSK: +arthralgias, + weakness in legs Neuro: +parasthesias to hands, + poor coordination, notes that she drops things, spills things. + feels mentally slow Derm: +puritis to legs SFHx. She has a new Development worker, communitypuppy Oliver. Mental Status Examination  Appearance: well dressed neat and clean Alert: Yes Attention: good  Cooperative: Yes Eye Contact: Good Speech: clear and goal directed Psychomotor Activity: Normal Memory/Concentration: good Oriented: x 3 Mood: Anxious and Depressed frustrated due to fatigue. Affect: Congruent Thought Processes and Associations: Goal Directed Fund of Knowledge: Good Thought Content: normal Insight: Good Judgement: Good  Diagnosis:  Generalized anxiety disorder  Maj. depressive disorder  NEW: Vit D3 deficiency B12 Deficiency  Glossitis parasthesias  Treatment Plan:  1. Will increase Wellbutrin to 200 in AM and continue prozac. 2. Encouraged patience and self tolerance for legitimate fatigue. 3. Will get folate and CBC/diff 4. Follow up in 3 weeks. Rona RavensNeil T. Atziri Zubiate  RPAC 10:45 AM 08/23/2014

## 2014-08-25 LAB — CBC WITH DIFFERENTIAL/PLATELET
Basophils Absolute: 0.1 10*3/uL (ref 0.0–0.1)
Basophils Relative: 1 % (ref 0–1)
Eosinophils Absolute: 0.5 10*3/uL (ref 0.0–0.7)
Eosinophils Relative: 7 % — ABNORMAL HIGH (ref 0–5)
HCT: 37.9 % (ref 36.0–46.0)
Hemoglobin: 13.1 g/dL (ref 12.0–15.0)
Lymphocytes Relative: 30 % (ref 12–46)
Lymphs Abs: 2.2 10*3/uL (ref 0.7–4.0)
MCH: 30.6 pg (ref 26.0–34.0)
MCHC: 34.6 g/dL (ref 30.0–36.0)
MCV: 88.6 fL (ref 78.0–100.0)
MONO ABS: 0.4 10*3/uL (ref 0.1–1.0)
MPV: 10.4 fL (ref 8.6–12.4)
Monocytes Relative: 5 % (ref 3–12)
Neutro Abs: 4.2 10*3/uL (ref 1.7–7.7)
Neutrophils Relative %: 57 % (ref 43–77)
Platelets: 325 10*3/uL (ref 150–400)
RBC: 4.28 MIL/uL (ref 3.87–5.11)
RDW: 13.3 % (ref 11.5–15.5)
WBC: 7.3 10*3/uL (ref 4.0–10.5)

## 2014-08-25 LAB — FOLATE: Folate: 14.6 ng/mL

## 2014-08-29 ENCOUNTER — Encounter: Payer: Self-pay | Admitting: Family Medicine

## 2014-08-29 ENCOUNTER — Ambulatory Visit (INDEPENDENT_AMBULATORY_CARE_PROVIDER_SITE_OTHER): Payer: Self-pay | Admitting: Family Medicine

## 2014-08-29 VITALS — BP 111/76 | HR 81 | Ht 63.0 in | Wt 159.0 lb

## 2014-08-29 DIAGNOSIS — E559 Vitamin D deficiency, unspecified: Secondary | ICD-10-CM

## 2014-08-29 DIAGNOSIS — R5383 Other fatigue: Secondary | ICD-10-CM

## 2014-08-29 DIAGNOSIS — E538 Deficiency of other specified B group vitamins: Secondary | ICD-10-CM

## 2014-08-29 LAB — VITAMIN B12: Vitamin B-12: 513 pg/mL (ref 211–911)

## 2014-08-29 NOTE — Progress Notes (Signed)
CC: Darlene Ochoa is a 49 y.o. female is here for Follow-up   Subjective: HPI:  Follow-up fatigue: Since I saw her last she's begun taking weekly vitamin D and daily B12 supplements. She's also had her Wellbutrin dose adjusted a few times by her psychiatrist. She tells me that her fatigue is remarkably better and now absent for the past week. She's able to do much more in and out of the house and still takes an occasional nap on the days that she feels like her energy has improved. She has no side effects from the above medication that she knows of. She denies any new shortness of breath or weakness. She denies any other motor or sensory disturbance or cognitive complaints.   Review Of Systems Outlined In HPI  Past Medical History  Diagnosis Date  . Depression   . Seasonal allergies   . Hypertension   . Major depressive disorder 03/31/2012  . Chest pain 03/31/2012  . H/O prolonged Q-T interval on ECG 03/31/2012  . Anxiety     Past Surgical History  Procedure Laterality Date  . Cesarean section  1998   Family History  Problem Relation Age of Onset  . Diabetes Mother   . Cancer Mother     Breast  . Depression Mother   . CAD Mother   . Heart failure Father   . Stroke Father   . Alcohol abuse Father   . Alcohol abuse Brother   . Drug abuse Brother     History   Social History  . Marital Status: Married    Spouse Name: N/A  . Number of Children: N/A  . Years of Education: N/A   Occupational History  . Not on file.   Social History Main Topics  . Smoking status: Never Smoker   . Smokeless tobacco: Never Used  . Alcohol Use: No  . Drug Use: No  . Sexual Activity:    Partners: Male   Other Topics Concern  . Not on file   Social History Narrative     Objective: BP 111/76 mmHg  Pulse 81  Ht 5\' 3"  (1.6 m)  Wt 159 lb (72.122 kg)  BMI 28.17 kg/m2  Vital signs reviewed. General: Alert and Oriented, No Acute Distress HEENT: Pupils equal, round, reactive to light.  Conjunctivae clear.  External ears unremarkable.  Moist mucous membranes. Lungs: Clear and comfortable work of breathing, speaking in full sentences without accessory muscle use. Cardiac: Regular rate and rhythm.  Neuro: CN II-XII grossly intact, gait normal. Extremities: No peripheral edema.  Strong peripheral pulses.  Mental Status: No depression, anxiety, nor agitation. Logical though process. Skin: Warm and dry.  Assessment & Plan: Darlene Ochoa was seen today for follow-up.  Diagnoses and all orders for this visit:  Vitamin D deficiency Orders: -     Vitamin D (25 hydroxy)  B12 deficiency Orders: -     Vitamin B12  Other fatigue Orders: -     Vitamin D (25 hydroxy) -     Vitamin B12   Fatigue: Clinically improving due for vitamin D and B12 levels to be rechecked. Continue current weekly and daily supplementation pending above results.  Return if symptoms worsen or fail to improve.

## 2014-08-30 LAB — VITAMIN D 25 HYDROXY (VIT D DEFICIENCY, FRACTURES): Vit D, 25-Hydroxy: 39 ng/mL (ref 30–100)

## 2014-09-08 ENCOUNTER — Ambulatory Visit (INDEPENDENT_AMBULATORY_CARE_PROVIDER_SITE_OTHER): Payer: Self-pay | Admitting: Family Medicine

## 2014-09-08 ENCOUNTER — Encounter: Payer: Self-pay | Admitting: Family Medicine

## 2014-09-08 VITALS — BP 128/83 | HR 70 | Temp 98.1°F | Wt 159.0 lb

## 2014-09-08 DIAGNOSIS — R300 Dysuria: Secondary | ICD-10-CM

## 2014-09-08 LAB — POCT URINALYSIS DIPSTICK
Bilirubin, UA: NEGATIVE
GLUCOSE UA: NEGATIVE
Ketones, UA: NEGATIVE
NITRITE UA: NEGATIVE
Protein, UA: NEGATIVE
Spec Grav, UA: 1.02
Urobilinogen, UA: 0.2
pH, UA: 6

## 2014-09-08 MED ORDER — CEPHALEXIN 500 MG PO CAPS: 500.0000 mg | ORAL_CAPSULE | Freq: Four times a day (QID) | ORAL | Status: DC

## 2014-09-08 NOTE — Progress Notes (Signed)
CC: Darlene NicksSusan Ochoa is a 49 y.o. female is here for Dysuria   Subjective: HPI:   urinary urgency, urinary frequency urinary hesitancy that has been present on daily basis for the last 5 days. Worsening on daily basis. It seems to improve the more hydrated she is. Interventions have included cranberry juice, no other interventions as of yet. Accompanied by low bilateral back pain. Feels like prior episodes of UTIs. Denies fevers, chills, abdominal pain, flank pain, confusion, nausea or vomiting no other genitourinary complaints   Review Of Systems Outlined In HPI  Past Medical History  Diagnosis Date  . Depression   . Seasonal allergies   . Hypertension   . Major depressive disorder 03/31/2012  . Chest pain 03/31/2012  . H/O prolonged Q-T interval on ECG 03/31/2012  . Anxiety     Past Surgical History  Procedure Laterality Date  . Cesarean section  1998   Family History  Problem Relation Age of Onset  . Diabetes Mother   . Cancer Mother     Breast  . Depression Mother   . CAD Mother   . Heart failure Father   . Stroke Father   . Alcohol abuse Father   . Alcohol abuse Brother   . Drug abuse Brother     History   Social History  . Marital Status: Married    Spouse Name: N/A  . Number of Children: N/A  . Years of Education: N/A   Occupational History  . Not on file.   Social History Main Topics  . Smoking status: Never Smoker   . Smokeless tobacco: Never Used  . Alcohol Use: No  . Drug Use: No  . Sexual Activity:    Partners: Male   Other Topics Concern  . Not on file   Social History Narrative     Objective: BP 128/83 mmHg  Pulse 70  Temp(Src) 98.1 F (36.7 C)  Wt 159 lb (72.122 kg)  Vital signs reviewed. General: Alert and Oriented, No Acute Distress HEENT: Pupils equal, round, reactive to light. Conjunctivae clear.  External ears unremarkable.  Moist mucous membranes. Lungs: Clear and comfortable work of breathing, speaking in full sentences without  accessory muscle use. Cardiac: Regular rate and rhythm.  Back: No CVA tenderness bilaterally Extremities: No peripheral edema.  Strong peripheral pulses.  Mental Status: No depression, anxiety, nor agitation. Logical though process. Skin: Warm and dry.  Assessment & Plan: [name] was seen today for dysuria.  Diagnoses and all orders for this visit:  Dysuria Orders: -     Urinalysis Dipstick -     Urine Culture -     cephALEXin (KEFLEX) 500 MG capsule; Take 1 capsule (500 mg total) by mouth 4 (four) times daily.   Following urine culture  Return if symptoms worsen or fail to improve.

## 2014-09-11 LAB — URINE CULTURE

## 2014-09-13 ENCOUNTER — Encounter (HOSPITAL_COMMUNITY): Payer: Self-pay | Admitting: Physician Assistant

## 2014-09-13 ENCOUNTER — Ambulatory Visit (INDEPENDENT_AMBULATORY_CARE_PROVIDER_SITE_OTHER): Payer: BLUE CROSS/BLUE SHIELD | Admitting: Physician Assistant

## 2014-09-13 VITALS — BP 116/84 | HR 85 | Ht 64.0 in | Wt 150.0 lb

## 2014-09-13 DIAGNOSIS — R5383 Other fatigue: Secondary | ICD-10-CM

## 2014-09-13 DIAGNOSIS — F32A Depression, unspecified: Secondary | ICD-10-CM

## 2014-09-13 DIAGNOSIS — F411 Generalized anxiety disorder: Secondary | ICD-10-CM

## 2014-09-13 DIAGNOSIS — E559 Vitamin D deficiency, unspecified: Secondary | ICD-10-CM

## 2014-09-13 DIAGNOSIS — E538 Deficiency of other specified B group vitamins: Secondary | ICD-10-CM

## 2014-09-13 DIAGNOSIS — F331 Major depressive disorder, recurrent, moderate: Secondary | ICD-10-CM

## 2014-09-13 DIAGNOSIS — F329 Major depressive disorder, single episode, unspecified: Secondary | ICD-10-CM

## 2014-09-13 MED ORDER — FLUOXETINE HCL 40 MG PO CAPS: 80.0000 mg | ORAL_CAPSULE | Freq: Every day | ORAL | Status: DC

## 2014-09-13 MED ORDER — BUPROPION HCL ER (SR) 200 MG PO TB12: 200.0000 mg | ORAL_TABLET | Freq: Every day | ORAL | Status: DC

## 2014-09-13 NOTE — Progress Notes (Signed)
[facility]  09/13/2014 10:38 AM Darlene Ochoa  MRN:  [id number] Subjective:  Patient is in to follow up on depression and anxiety with fatigue. She reports that she is 100% better. She is thrilled with how she feels. She states it seems like a light switch has been flipped. She has energy, can do what she needs to do, is not sleeping all day, is enjoying her energy and positive outlook. Principal Problem: MDD with fatigue, GAD,  Diagnosis:   Patient Active Problem List   Diagnosis Date Noted  . Generalized anxiety disorder [F41.1] 12/27/2012    Priority: High  . Major depressive disorder [F32.2] 03/31/2012    Priority: High  . Anxiety [F41.9] 04/27/2012    Priority: Medium  . Vitamin D deficiency [E55.9] 08/01/2014  . B12 deficiency [E53.8] 08/01/2014  . Prolonged Q-T interval on ECG [I45.81] 10/06/2013  . Major depressive disorder, recurrent episode, moderate [F33.1] 12/27/2012  . Essential hypertension [I10] 04/27/2012  . Chest pain [R07.9] 03/31/2012  . Shortness of breath [R06.02] 03/31/2012  . Migraine [G43.909] 03/31/2012  . PALPITATIONS [R00.2] 08/02/2009   Total Time spent with patient: 30 minutes   Past Medical History:  Past Medical History  Diagnosis Date  . Depression   . Seasonal allergies   . Hypertension   . Major depressive disorder 03/31/2012  . Chest pain 03/31/2012  . H/O prolonged Q-T interval on ECG 03/31/2012  . Anxiety     Past Surgical History  Procedure Laterality Date  . Cesarean section  1998   Family History:  Family History  Problem Relation Age of Onset  . Diabetes Mother   . Cancer Mother     Breast  . Depression Mother   . CAD Mother   . Heart failure Father   . Stroke Father   . Alcohol abuse Father   . Alcohol abuse Brother   . Drug abuse Brother    Social History:  History  Alcohol Use No     History  Drug Use No    History   Social History  . Marital Status: Married    Spouse Name: N/A  . Number of  Children: N/A  . Years of Education: N/A   Social History Main Topics  . Smoking status: Never Smoker   . Smokeless tobacco: Never Used  . Alcohol Use: No  . Drug Use: No  . Sexual Activity:    Partners: Male   Other Topics Concern  . None   Social History Narrative   Additional History:    Sleep: Good  Appetite:  Good   Assessment:   Musculoskeletal: Strength & Muscle Tone: within normal limits Gait & Station: normal Patient leans: normal   Psychiatric Specialty Exam: Physical Exam  Review of Systems  All other systems reviewed and are negative.   Blood pressure 116/84, pulse 85, height 5\' 4"  (1.626 m), weight 150 lb (68.04 kg).Body mass index is 25.73 kg/(m^2).  General Appearance: Well Groomed  Patent attorneyye Contact::  Good  Speech:  Clear and Coherent  Volume:  Normal  Mood:  Euthymic  Affect:  Congruent  Thought Process:  Coherent, Goal Directed, Intact, Linear and Logical  Orientation:  Full (Time, Place, and Person)  Thought Content:  WDL  Suicidal Thoughts:  No  Homicidal Thoughts:  No  Memory:  Immediate;   Good Recent;   Good Remote;   Good  Judgement:  Intact  Insight:  Good  Psychomotor Activity:  Normal  Concentration:  Good  Recall:  [place] Major of Knowledge:Good  Language: Good  Akathisia:  No  Handed:  Right  AIMS (if indicated):     Assets:  Communication Skills Desire for Improvement Financial Resources/Insurance Housing Intimacy Leisure Time Physical Health Resilience Social Support Talents/Skills Transportation  ADL's:  Intact  Cognition: WNL  Sleep:        Current Medications: Current Outpatient Prescriptions  Medication Sig Dispense Refill  . buPROPion (WELLBUTRIN SR) 200 MG 12 hr tablet Take 1 tablet (200 mg total) by mouth daily. 30 tablet 2  . cephALEXin (KEFLEX) 500 MG capsule Take 1 capsule (500 mg total) by mouth 4 (four) times daily. 40 capsule 0  . FLUoxetine (PROZAC) 40 MG capsule Take 80 mg by mouth.    .  hydrochlorothiazide (HYDRODIURIL) 25 MG tablet Take 1 tablet (25 mg total) by mouth daily. 90 tablet 2  . loratadine (CLARITIN) 10 MG tablet Take 10 mg by mouth daily.    Marland Kitchen MINASTRIN 24 FE 1-20 MG-MCG(24) CHEW Chew 1 tablet by mouth Daily.    . Vitamin D, Ergocalciferol, (DRISDOL) 50000 UNITS CAPS capsule Take 1 capsule (50,000 Units total) by mouth every 7 (seven) days. Recheck Vitamin D in 3 Months 12 capsule 0  . clonazePAM (KLONOPIN) 0.5 MG tablet TAKE 1 TABLET BY MOUTH EVERY DAY AS NEEDED FOR ANXIETY (Patient not taking: Reported on 09/13/2014) 30 tablet 0  . ibuprofen (ADVIL,MOTRIN) 200 MG tablet Take 200 mg by mouth every 6 (six) hours as needed.    . SUMAtriptan (IMITREX) 100 MG tablet Take 50 mg by mouth as needed.    . vitamin B-12 (CYANOCOBALAMIN) 1000 MCG tablet Take 1 tablet (1,000 mcg total) by mouth daily. (Patient not taking: Reported on 09/13/2014) 30 tablet 1   No current facility-administered medications for this visit.    Lab Results: reviewed Vit. D3, FE levels Physical Findings: AIMS:  CIWA:    COWS:     Treatment Plan Summary: Daily contact with patient to assess and evaluate symptoms and progress in treatment   Medical Decision Making:  Established Problem, Stable/Improving (1) and Review or order medicine tests (1) MDD with Fatigue stable 1. Continue current level of care with wellbutrin/prozac. 2. Continue vitamins as directed. 3. Follow up 3 months. Darlene Ochoa. Darlene Ochoa RPAC 09/13/2014     Darlene Ochoa 09/13/2014, 10:38 AM

## 2014-09-15 ENCOUNTER — Other Ambulatory Visit (HOSPITAL_COMMUNITY): Payer: Self-pay | Admitting: Physician Assistant

## 2014-09-22 ENCOUNTER — Encounter: Payer: Self-pay | Admitting: Family Medicine

## 2014-09-22 ENCOUNTER — Ambulatory Visit (INDEPENDENT_AMBULATORY_CARE_PROVIDER_SITE_OTHER): Payer: BLUE CROSS/BLUE SHIELD | Admitting: Family Medicine

## 2014-09-22 VITALS — BP 127/84 | HR 81 | Wt 157.0 lb

## 2014-09-22 DIAGNOSIS — K140 Glossitis: Secondary | ICD-10-CM | POA: Diagnosis not present

## 2014-09-22 MED ORDER — NYSTATIN 100000 UNIT/ML MT SUSP
5.0000 mL | Freq: Four times a day (QID) | OROMUCOSAL | Status: DC
Start: 2014-09-22 — End: 2016-01-08

## 2014-09-22 NOTE — Progress Notes (Signed)
CC: Darlene Ochoa is a 49 y.o. female is here for Oral Swelling   Subjective: HPI:  2-3 days of tongue swelling and mild pain in the tongue. Nothing seems to make symptoms better but they're worse when her teeth rub against the sides of the tongue. She's had episodes of this before but it usually went away within a day and she never paid much attention to it. She is uncertain when these episodes started. She tried Benadryl and ibuprofen neither one of them helped. She denies difficulty swallowing, trouble breathing, wheezing, shortness of breath, fevers, chills, nor sore throat.   Review Of Systems Outlined In HPI  Past Medical History  Diagnosis Date  . Depression   . Seasonal allergies   . Hypertension   . Major depressive disorder 03/31/2012  . Chest pain 03/31/2012  . H/O prolonged Q-T interval on ECG 03/31/2012  . Anxiety     Past Surgical History  Procedure Laterality Date  . Cesarean section  1998   Family History  Problem Relation Age of Onset  . Diabetes Mother   . Cancer Mother     Breast  . Depression Mother   . CAD Mother   . Heart failure Father   . Stroke Father   . Alcohol abuse Father   . Alcohol abuse Brother   . Drug abuse Brother     History   Social History  . Marital Status: Married    Spouse Name: N/A  . Number of Children: N/A  . Years of Education: N/A   Occupational History  . Not on file.   Social History Main Topics  . Smoking status: Never Smoker   . Smokeless tobacco: Never Used  . Alcohol Use: No  . Drug Use: No  . Sexual Activity:    Partners: Male   Other Topics Concern  . Not on file   Social History Narrative     Objective: BP 127/84 mmHg  Pulse 81  Wt 157 lb (71.215 kg)  General: Alert and Oriented, No Acute Distress HEENT: Pupils equal, round, reactive to light. Conjunctivae clear.  Moist mucous membranes pharynx unremarkable, tongue is mildly swollen with indentations of the teeth on the sides of the tongue. There  is a mild white brown film on the surface of the tongue. No facial or lip swelling Lungs: Clear comfortable work of breathing Cardiac: Regular rate and rhythm.  Extremities: No peripheral edema.  Strong peripheral pulses.  Mental Status: No depression, anxiety, nor agitation. Skin: Warm and dry.  Assessment & Plan: Darlene Ochoa was seen today for oral swelling.  Diagnoses and all orders for this visit:  Glossitis Orders: -     Vitamin B2(Riboflavin),Plasma -     nystatin (MYCOSTATIN) 100000 UNIT/ML suspension; Take 5 mLs (500,000 Units total) by mouth 4 (four) times daily. Swish fifteen seconds and swallow. Use for up to one day after symptoms resolve.   Glossitis: Possibly due to mild thrush infection therefore start nystatin. Rule out Vitamin B2 deficiency.    Return if symptoms worsen or fail to improve.

## 2014-09-28 ENCOUNTER — Ambulatory Visit (INDEPENDENT_AMBULATORY_CARE_PROVIDER_SITE_OTHER): Payer: BLUE CROSS/BLUE SHIELD | Admitting: Family Medicine

## 2014-09-28 ENCOUNTER — Encounter: Payer: Self-pay | Admitting: Family Medicine

## 2014-09-28 VITALS — BP 122/78 | HR 79 | Wt 156.0 lb

## 2014-09-28 DIAGNOSIS — L237 Allergic contact dermatitis due to plants, except food: Secondary | ICD-10-CM

## 2014-09-28 MED ORDER — PREDNISONE 20 MG PO TABS: ORAL_TABLET | ORAL | Status: AC

## 2014-09-28 NOTE — Progress Notes (Signed)
CC: Darlene Ochoa is a 49 y.o. female is here for Rash   Subjective: HPI:  Rash on the face and neck that began yesterday. It itches but is painless. It slightly improved with Benadryl and calamine lotion. She was exposed to poison ivy [place] on [place]. New spots seem to be popping up every hour. She denies any fevers, chills, wheezing, cough, shortness of breath, nor skin changes elsewhere. Symptoms are mild to moderate in severity   Review Of Systems Outlined In HPI  Past Medical History  Diagnosis Date  . Depression   . Seasonal allergies   . Hypertension   . Major depressive disorder 03/31/2012  . Chest pain 03/31/2012  . H/O prolonged Q-T interval on ECG 03/31/2012  . Anxiety     Past Surgical History  Procedure Laterality Date  . Cesarean section  1998   Family History  Problem Relation Age of Onset  . Diabetes Mother   . Cancer Mother     Breast  . Depression Mother   . CAD Mother   . Heart failure Father   . Stroke Father   . Alcohol abuse Father   . Alcohol abuse Brother   . Drug abuse Brother     History   Social History  . Marital Status: Married    Spouse Name: N/A  . Number of Children: N/A  . Years of Education: N/A   Occupational History  . Not on file.   Social History Main Topics  . Smoking status: Never Smoker   . Smokeless tobacco: Never Used  . Alcohol Use: No  . Drug Use: No  . Sexual Activity:    Partners: Male   Other Topics Concern  . Not on file   Social History Narrative     Objective: BP 122/78 mmHg  Pulse 79  Wt 156 lb (70.761 kg)  Vital signs reviewed. General: Alert and Oriented, No Acute Distress HEENT: Pupils equal, round, reactive to light. Conjunctivae clear.  External ears unremarkable.  Moist mucous membranes. Lungs: Clear and comfortable work of breathing, speaking in full sentences without accessory muscle use. Cardiac: Regular rate and rhythm.  Neuro: CN II-XII grossly intact, gait  normal. Extremities: No peripheral edema.  Strong peripheral pulses.  Mental Status: No depression, anxiety, nor agitation. Logical though process. Skin: Warm and dry. Clusters and streaks of small erythematous vesicles on the face involving the forehead and neck the worst.  Assessment & Plan: Darlene Ochoa was seen today for rash.  Diagnoses and all orders for this visit:  Poison ivy Orders: -     predniSONE (DELTASONE) 20 MG tablet; Three tabs daily days 1-3, two tabs daily days 4-6, one tab daily days 7-9, half tab daily days 10-13.   Poison ivy: Start prednisone taper she politely declines hydroxyzine.  Return if symptoms worsen or fail to improve.

## 2014-10-02 LAB — VITAMIN B2(RIBOFLAVIN),PLASMA: Vitamin B2(Riboflavin),Plasma: 20.4 nmol/L (ref 6.2–39.0)

## 2014-11-10 ENCOUNTER — Encounter: Payer: Self-pay | Admitting: *Deleted

## 2014-11-10 ENCOUNTER — Emergency Department (INDEPENDENT_AMBULATORY_CARE_PROVIDER_SITE_OTHER): Payer: BLUE CROSS/BLUE SHIELD

## 2014-11-10 ENCOUNTER — Emergency Department (INDEPENDENT_AMBULATORY_CARE_PROVIDER_SITE_OTHER)
Admission: EM | Admit: 2014-11-10 | Discharge: 2014-11-10 | Disposition: A | Discharge status: Home / Self Care | Payer: BLUE CROSS/BLUE SHIELD | Source: Home / Self Care | Attending: Family Medicine | Admitting: Family Medicine

## 2014-11-10 DIAGNOSIS — M79641 Pain in right hand: Secondary | ICD-10-CM

## 2014-11-10 DIAGNOSIS — S60221A Contusion of right hand, initial encounter: Secondary | ICD-10-CM | POA: Diagnosis not present

## 2014-11-10 DIAGNOSIS — M25531 Pain in right wrist: Secondary | ICD-10-CM

## 2014-11-10 DIAGNOSIS — S63501A Unspecified sprain of right wrist, initial encounter: Secondary | ICD-10-CM

## 2014-11-10 NOTE — ED Provider Notes (Signed)
CSN: [id number]     Arrival date & time 11/10/14  1446 History   First MD Initiated Contact with Patient 11/10/14 1453     Chief Complaint  Patient presents with  . Wrist Pain      HPI Comments: While taking her dog out this morning patient fell, bracing herself with her right hand.  She complains of pain in her right wrist and right thumb.  Patient is a 49 y.o. female presenting with wrist pain. The history is provided by the patient.  Wrist Pain This is a new problem. Episode onset: 5 hours ago. The problem occurs constantly. The problem has not changed since onset.Associated symptoms comments: Right thumb pain. Exacerbated by: flexion and extension of right wrist. Nothing relieves the symptoms. Treatments tried: ice pack. The treatment provided mild relief.    Past Medical History  Diagnosis Date  . Depression   . Seasonal allergies   . Hypertension   . Major depressive disorder 03/31/2012  . Chest pain 03/31/2012  . H/O prolonged Q-T interval on ECG 03/31/2012  . Anxiety    Past Surgical History  Procedure Laterality Date  . Cesarean section  1998   Family History  Problem Relation Age of Onset  . Diabetes Mother   . Cancer Mother     Breast  . Depression Mother   . CAD Mother   . Heart failure Father   . Stroke Father   . Alcohol abuse Father   . Alcohol abuse Brother   . Drug abuse Brother    History  Substance Use Topics  . Smoking status: Never Smoker   . Smokeless tobacco: Never Used  . Alcohol Use: No   OB History    No data available     Review of Systems  All other systems reviewed and are negative.   Allergies  Lac bovis; Pork allergy; Codeine; Doxycycline; Lactose intolerance (gi); Peanut-containing drug products; Penicillins; Sulfonamide derivatives; and Trazodone and nefazodone  Home Medications   Prior to Admission medications   Medication Sig Start Date End Date Taking? Authorizing Provider  buPROPion (WELLBUTRIN SR) 200 MG 12 hr tablet  Take 1 tablet (200 mg total) by mouth daily. 09/13/14 09/13/15  Tamala Julian, PA-C  clonazePAM (KLONOPIN) 0.5 MG tablet TAKE 1 TABLET BY MOUTH EVERY DAY AS NEEDED FOR ANXIETY 04/10/14   Sean Hommel, DO  FLUoxetine (PROZAC) 40 MG capsule Take 2 capsules (80 mg total) by mouth daily. 09/13/14   Tamala Julian, PA-C  hydrochlorothiazide (HYDRODIURIL) 25 MG tablet Take 1 tablet (25 mg total) by mouth daily. 07/31/14   Sean Hommel, DO  loratadine (CLARITIN) 10 MG tablet Take 10 mg by mouth daily.    Historical Provider, MD  MINASTRIN 24 FE 1-20 MG-MCG(24) CHEW Chew 1 tablet by mouth Daily. 04/22/12   Historical Provider, MD  nystatin (MYCOSTATIN) 100000 UNIT/ML suspension Take 5 mLs (500,000 Units total) by mouth 4 (four) times daily. Swish fifteen seconds and swallow. Use for up to one day after symptoms resolve. 09/22/14   Laren Boom, DO  SUMAtriptan (IMITREX) 100 MG tablet Take 50 mg by mouth as needed. 04/22/12   Historical Provider, MD  vitamin B-12 (CYANOCOBALAMIN) 1000 MCG tablet Take 1 tablet (1,000 mcg total) by mouth daily. 08/01/14   Laren Boom, DO  Vitamin D, Ergocalciferol, (DRISDOL) 50000 UNITS CAPS capsule Take 1 capsule (50,000 Units total) by mouth every 7 (seven) days. Recheck Vitamin D in 3 Months 08/01/14   Laren Boom, DO  BP 118/81 mmHg  Pulse 98  Resp 14  Wt 156 lb (70.761 kg) Physical Exam  Constitutional: She is oriented to person, place, and time. She appears well-developed and well-nourished. No distress.  HENT:  Head: Atraumatic.  Eyes: Pupils are equal, round, and reactive to light.  Musculoskeletal:       Right wrist: She exhibits decreased range of motion, tenderness and bony tenderness. She exhibits no swelling, no effusion, no crepitus and no deformity.       Right hand: She exhibits decreased range of motion, tenderness and bony tenderness. She exhibits normal two-point discrimination, normal capillary refill, no deformity and no swelling. Normal sensation noted.  Right  wrist has tenderness to palpation dorsally over distal radius and the snuffbox.  Right hand has tenderness to palpation over the first metacarpal.  Neurological: She is alert and oriented to person, place, and time.  Skin: Skin is warm and dry.  Nursing note and vitals reviewed.   ED Course  Proceduresnone  Imaging Review Dg Wrist Complete Right  11/10/2014   CLINICAL DATA:  Fall today, right wrist pain  EXAM: RIGHT WRIST - COMPLETE 3+ VIEW  COMPARISON:  None.  FINDINGS: Four views of the right wrist submitted. No acute fracture or subluxation. No radiopaque foreign body.  IMPRESSION: Negative.   Electronically Signed   By: Natasha Mead M.D.   On: 11/10/2014 15:18   Dg Hand Complete Right  11/10/2014   CLINICAL DATA:  Fall today, right dorsal distal radius pain, snuffbox pain  EXAM: RIGHT HAND - COMPLETE 3+ VIEW  COMPARISON:  None.  FINDINGS: Three views of the right hand submitted. No acute fracture or subluxation. No radiopaque foreign body.  IMPRESSION: Negative.   Electronically Signed   By: Natasha Mead M.D.   On: 11/10/2014 15:19     MDM   1. Right wrist sprain, initial encounter   2. Contusion of right hand, initial encounter    Ace wrap applied.  Dispensed velcro wrist splint. Apply ice pack for 30 minutes every 1 to 2 hours today and tomorrow.  Wear Ace wrap until swelling decreases, then wear splint for 7 to 10 days.  Begin range of motion and stretching exercises in about 5 days as per instruction sheet. May take Ibuprofen , 4 tabs every 8 hours with food.  Followup with Dr. Rod[facility] ([facility]) if not improving about two weeks.     Lattie Haw, MD 11/10/14 1537

## 2014-11-10 NOTE — ED Notes (Signed)
Pt reports falling backwards this am catching herself withher right hand/wrist. C/o right hand, wrist and FA pain. Previous fx to right hand.

## 2014-11-10 NOTE — Discharge Instructions (Signed)
Apply ice pack for 30 minutes every 1 to 2 hours today and tomorrow.  Wear Ace wrap until swelling decreases, then wear splint for 7 to 10 days.  Begin range of motion and stretching exercises in about 5 days as per instruction sheet. May take Ibuprofen , 4 tabs every 8 hours with food.

## 2014-11-13 ENCOUNTER — Telehealth: Payer: Self-pay | Admitting: Emergency Medicine

## 2014-12-11 ENCOUNTER — Ambulatory Visit (HOSPITAL_COMMUNITY): Payer: Self-pay | Admitting: Psychiatry

## 2014-12-13 ENCOUNTER — Ambulatory Visit (INDEPENDENT_AMBULATORY_CARE_PROVIDER_SITE_OTHER): Payer: BLUE CROSS/BLUE SHIELD | Admitting: Family Medicine

## 2014-12-13 ENCOUNTER — Ambulatory Visit (INDEPENDENT_AMBULATORY_CARE_PROVIDER_SITE_OTHER): Payer: BLUE CROSS/BLUE SHIELD | Admitting: Psychiatry

## 2014-12-13 ENCOUNTER — Encounter: Payer: Self-pay | Admitting: Family Medicine

## 2014-12-13 ENCOUNTER — Other Ambulatory Visit (HOSPITAL_COMMUNITY): Payer: Self-pay | Admitting: Physician Assistant

## 2014-12-13 ENCOUNTER — Encounter (HOSPITAL_COMMUNITY): Payer: Self-pay | Admitting: Psychiatry

## 2014-12-13 VITALS — BP 124/64 | HR 84 | Ht 65.0 in | Wt 154.0 lb

## 2014-12-13 VITALS — BP 130/87 | HR 85 | Wt 149.0 lb

## 2014-12-13 DIAGNOSIS — L0390 Cellulitis, unspecified: Secondary | ICD-10-CM

## 2014-12-13 DIAGNOSIS — F329 Major depressive disorder, single episode, unspecified: Secondary | ICD-10-CM | POA: Diagnosis not present

## 2014-12-13 DIAGNOSIS — F331 Major depressive disorder, recurrent, moderate: Secondary | ICD-10-CM

## 2014-12-13 DIAGNOSIS — F411 Generalized anxiety disorder: Secondary | ICD-10-CM

## 2014-12-13 DIAGNOSIS — R5383 Other fatigue: Secondary | ICD-10-CM | POA: Diagnosis not present

## 2014-12-13 DIAGNOSIS — F32A Depression, unspecified: Secondary | ICD-10-CM

## 2014-12-13 MED ORDER — LIDOCAINE 5 % EX OINT: TOPICAL_OINTMENT | CUTANEOUS | Status: DC

## 2014-12-13 MED ORDER — FLUOXETINE HCL 40 MG PO CAPS: 80.0000 mg | ORAL_CAPSULE | Freq: Every day | ORAL | Status: DC

## 2014-12-13 MED ORDER — CLINDAMYCIN HCL 300 MG PO CAPS: 300.0000 mg | ORAL_CAPSULE | Freq: Three times a day (TID) | ORAL | Status: DC

## 2014-12-13 MED ORDER — BUPROPION HCL ER (SR) 200 MG PO TB12: 200.0000 mg | ORAL_TABLET | Freq: Every day | ORAL | Status: DC

## 2014-12-13 NOTE — Progress Notes (Signed)
Patient ID: Darlene Ochoa, female   DOB: Apr 23, 1965, 49 y.o.   MRN: [id number] [facility] MD Progress Note  12/13/2014 10:52 AM [name]  MRN:  [id number] Subjective:  Patient is in to follow up on depression and anxiety with fatigue. Has been [facility] patient MDD: On Prozac and Wellbutrin. Since Wellbutrin has been increased her depression is getting better. She has been found deficient in vitamin D and B-12 since she is getting that her depression including her energy level is also improved. Anxiety; improved not having any excessive worries or panic like symptoms she continues to take Prozac fatigue; improved since she is getting her vitamins   aggravating factor; her dad's that in the past she has been diagnosed depression 15 years ago because of psychosocial stressors and depression. Her mom is in an assisted living facility She injured her legs by a cord that ran around her leg pulled by her dog. Will see Dr. Ivan Anchors today for wound care.  Modifying factors; the husband and having pets She is taking interest in things or energy level is improved sleep is fine  No psychotic symptoms   severity of depression; 8 hour time period 10 being no depression   Principal Problem: MDD with fatigue, GAD,  Diagnosis:   Patient Active Problem List   Diagnosis Date Noted  . Vitamin D deficiency [E55.9] 08/01/2014  . B12 deficiency [E53.8] 08/01/2014  . Prolonged Q-T interval on ECG [I45.81] 10/06/2013  . Major depressive disorder, recurrent episode, moderate [F33.1] 12/27/2012  . Generalized anxiety disorder [F41.1] 12/27/2012  . Anxiety [F41.9] 04/27/2012  . Essential hypertension [I10] 04/27/2012  . Major depressive disorder [F32.2] 03/31/2012  . Chest pain [R07.9] 03/31/2012  . Shortness of breath [R06.02] 03/31/2012  . Migraine [G43.909] 03/31/2012  . PALPITATIONS [R00.2] 08/02/2009   Total Time spent with patient: 30 minutes   Past Medical History:  Past Medical History  Diagnosis Date  .  Depression   . Seasonal allergies   . Hypertension   . Major depressive disorder 03/31/2012  . Chest pain 03/31/2012  . H/O prolonged Q-T interval on ECG 03/31/2012  . Anxiety     Past Surgical History  Procedure Laterality Date  . Cesarean section  1998   Family History:  Family History  Problem Relation Age of Onset  . Diabetes Mother   . Cancer Mother     Breast  . Depression Mother   . CAD Mother   . Heart failure Father   . Stroke Father   . Alcohol abuse Father   . Alcohol abuse Brother   . Drug abuse Brother    Social History:  History  Alcohol Use No     History  Drug Use No    Social History   Social History  . Marital Status: Married    Spouse Name: N/A  . Number of Children: N/A  . Years of Education: N/A   Social History Main Topics  . Smoking status: Never Smoker   . Smokeless tobacco: Never Used  . Alcohol Use: No  . Drug Use: No  . Sexual Activity:    Partners: Male   Other Topics Concern  . None   Social History Narrative   Additional History:    Sleep: Good  Appetite:  Good   Assessment:   Musculoskeletal: Strength & Muscle Tone: within normal limits Gait & Station: normal Patient leans: normal   Psychiatric Specialty Exam: Physical Exam  Review of Systems  Constitutional: Negative for malaise/fatigue.  Skin:  Negative for rash.  Neurological: Negative for headaches.  Psychiatric/Behavioral: Negative for depression and suicidal ideas. The patient is not nervous/anxious.   All other systems reviewed and are negative.   Blood pressure 124/64, pulse 84, height  (1.651 m), weight 154 lb (69.854 kg), SpO2 99 %.Body mass index is 25.63 kg/(m^2).  General Appearance: Well Groomed  Patent attorney::  Good  Speech:  Clear and Coherent  Volume:  Normal  Mood:  Euthymic  Affect:  Congruent  Thought Process:  Coherent, Goal Directed, Intact, Linear and Logical  Orientation:  Full (Time, Place, and Person)  Thought Content:   WDL  Suicidal Thoughts:  No  Homicidal Thoughts:  No  Memory:  Immediate;   Good Recent;   Good Remote;   Good  Judgement:  Intact  Insight:  Good  Psychomotor Activity:  Normal  Concentration:  Good  Recall:  Good  Fund of Knowledge:Good  Language: Good  Akathisia:  No  Handed:  Right  AIMS (if indicated):     Assets:  Communication Skills Desire for Improvement Financial Resources/Insurance Housing Intimacy Leisure Time Physical Health Resilience Social Support Talents/Skills Transportation  ADL's:  Intact  Cognition: WNL  Sleep:        Current Medications: Current Outpatient Prescriptions  Medication Sig Dispense Refill  . buPROPion (WELLBUTRIN SR) 200 MG 12 hr tablet Take 1 tablet (200 mg total) by mouth daily. 30 tablet 1  . FLUoxetine (PROZAC) 40 MG capsule Take 2 capsules (80 mg total) by mouth daily. 60 capsule 1  . hydrochlorothiazide (HYDRODIURIL) 25 MG tablet Take 1 tablet (25 mg total) by mouth daily. 90 tablet 2  . loratadine (CLARITIN) 10 MG tablet Take 10 mg by mouth daily.    Marland Kitchen MINASTRIN 24 FE 1-20 MG-MCG(24) CHEW Chew 1 tablet by mouth Daily.    Marland Kitchen nystatin (MYCOSTATIN) 100000 UNIT/ML suspension Take 5 mLs (500,000 Units total) by mouth 4 (four) times daily. Swish fifteen seconds and swallow. Use for up to one day after symptoms resolve. 240 mL 1  . SUMAtriptan (IMITREX) 100 MG tablet Take 50 mg by mouth as needed.    . vitamin B-12 (CYANOCOBALAMIN) 1000 MCG tablet Take 1 tablet (1,000 mcg total) by mouth daily. 30 tablet 1  . Vitamin D, Ergocalciferol, (DRISDOL) 50000 UNITS CAPS capsule Take 1 capsule (50,000 Units total) by mouth every 7 (seven) days. Recheck Vitamin D in 3 Months 12 capsule 0  . [DISCONTINUED] clonazePAM (KLONOPIN) 0.5 MG tablet TAKE 1 TABLET BY MOUTH EVERY DAY AS NEEDED FOR ANXIETY 30 tablet 0   No current facility-administered medications for this visit.    Lab Results: reviewed Vit. D3, FE levels Physical Findings: AIMS:   CIWA:    COWS:     Treatment Plan Summary: Continue Prozac and Wellbutrin for depression  Continue Prozac for anxiety  Call 911 or report local ED for any urgent concerns or suicidal thoughts.  Her fatigue is also improved since she is on vitamins. Prescription refill sent follow-up in 2 or 3 months or earlier if needed     Demri Poulton 12/13/2014, 10:52 AM

## 2014-12-13 NOTE — Progress Notes (Signed)
CC: Darlene Ochoa is a 49 y.o. female is here for burn on legs from dog leash ?   Subjective: HPI:  Sharp burning and throbbing pain localized on the right and left lower legs. Localized to 3 regions where she sustained a rope burn from a dog leash on Monday of last week. They're painful to the touch and also when changing dressings. She's been using sterile gauze and triple antibiotic ointment changed multiple times during the day. The pain has not gotten better or worse since onset and redness is now spreading around the periphery of these wounds. It's draining clear fluid. Symptoms are present all hours of the day and interfere with sleep. Denies any fevers, chills, swollen lymph nodes or nausea   Review Of Systems Outlined In HPI  Past Medical History  Diagnosis Date  . Depression   . Seasonal allergies   . Hypertension   . Major depressive disorder 03/31/2012  . Chest pain 03/31/2012  . H/O prolonged Q-T interval on ECG 03/31/2012  . Anxiety     Past Surgical History  Procedure Laterality Date  . Cesarean section  1998   Family History  Problem Relation Age of Onset  . Diabetes Mother   . Cancer Mother     Breast  . Depression Mother   . CAD Mother   . Heart failure Father   . Stroke Father   . Alcohol abuse Father   . Alcohol abuse Brother   . Drug abuse Brother     Social History   Social History  . Marital Status: Married    Spouse Name: N/A  . Number of Children: N/A  . Years of Education: N/A   Occupational History  . Not on file.   Social History Main Topics  . Smoking status: Never Smoker   . Smokeless tobacco: Never Used  . Alcohol Use: No  . Drug Use: No  . Sexual Activity:    Partners: Male   Other Topics Concern  . Not on file   Social History Narrative     Objective: BP 130/87 mmHg  Pulse 85  Wt 149 lb (67.586 kg)  Vital signs reviewed. General: Alert and Oriented, No Acute Distress HEENT: Pupils equal, round, reactive to light.  Conjunctivae clear.  External ears unremarkable.  Moist mucous membranes. Lungs: Clear and comfortable work of breathing, speaking in full sentences without accessory muscle use. Cardiac: Regular rate and rhythm.  Neuro: CN II-XII grossly intact, gait normal. Extremities: No peripheral edema.  Strong peripheral pulses.  Mental Status: No depression, anxiety, nor agitation. Logical though process. Skin: Warm and dry. There are 3 Linear ulcerations on the lower extremities. All of these are approximately 1 cm in width, they range in length from 3 cm to 7 cm. One on the right ankle one on the right calf, one on the left ankle. They all are erythematous and warm to the touch.   Assessment & Plan: Lottie was seen today for burn on legs from dog leash ?.  Diagnoses and all orders for this visit:  Cellulitis, unspecified cellulitis site, unspecified extremity site, unspecified laterality -     lidocaine (XYLOCAINE) 5 % ointment; Apply to burns every six hours as needed -     clindamycin (CLEOCIN) 300 MG capsule; Take 1 capsule (300 mg total) by mouth 3 (three) times daily.   Appears to have a mild cellulitis at each of these sites therefore start clindamycin. Lidocaine was applied to all of these wounds which  cause some initial irritation but provided complete relief of her pain after a few minutes. I've given her prescription of this that she can use at home replaced every 6 hours for at least the next four days. Keep covered with sterile guaze.   Return if symptoms worsen or fail to improve.

## 2014-12-14 ENCOUNTER — Ambulatory Visit (HOSPITAL_COMMUNITY): Payer: Self-pay | Admitting: Psychiatry

## 2014-12-22 NOTE — Telephone Encounter (Signed)
Patient called and left a message for a refill on Imitrex. This medication has never been prescribed by Dr Ivan Anchors.

## 2014-12-26 MED ORDER — SUMATRIPTAN SUCCINATE 100 MG PO TABS: 50.0000 mg | ORAL_TABLET | ORAL | Status: DC | PRN

## 2015-01-08 ENCOUNTER — Ambulatory Visit (INDEPENDENT_AMBULATORY_CARE_PROVIDER_SITE_OTHER): Payer: BLUE CROSS/BLUE SHIELD | Admitting: Family Medicine

## 2015-01-08 ENCOUNTER — Encounter: Payer: Self-pay | Admitting: Family Medicine

## 2015-01-08 VITALS — BP 126/79 | HR 74 | Temp 98.6°F | Wt 153.0 lb

## 2015-01-08 DIAGNOSIS — G43809 Other migraine, not intractable, without status migrainosus: Secondary | ICD-10-CM | POA: Diagnosis not present

## 2015-01-08 DIAGNOSIS — K130 Diseases of lips: Secondary | ICD-10-CM

## 2015-01-08 MED ORDER — SUMATRIPTAN SUCCINATE 100 MG PO TABS: 50.0000 mg | ORAL_TABLET | ORAL | Status: DC | PRN

## 2015-01-08 MED ORDER — TRIAMCINOLONE ACETONIDE 0.1 % MT PSTE: 1.0000 "application " | PASTE | Freq: Three times a day (TID) | OROMUCOSAL | Status: DC

## 2015-01-08 NOTE — Progress Notes (Signed)
CC: Darlene Ochoa is a 49 y.o. female is here for lump under chin and cheilitis?   Subjective: HPI:  One week of lip irritation in the corners of the mouth.  No interventions as of yet. Not getting better or worse for seven days.  Most painful when mouth is open.   Symptoms came on gradually, they're persistent. Nothing else makes it better or worse other than that described above. She denies fevers, chills, difficulty swallowing, or skin pain elsewhere. She's noticed a small mass underneath her chin appeared yesterday. Slightly tender to the touch. No pain with salivation.  Impression refill of Imitrex. She reports a migraine every 1 or 2 months which responded within a few minutes Imitrex. She is out of this medication.   Review Of Systems Outlined In HPI  Past Medical History  Diagnosis Date  . Depression   . Seasonal allergies   . Hypertension   . Major depressive disorder 03/31/2012  . Chest pain 03/31/2012  . H/O prolonged Q-T interval on ECG 03/31/2012  . Anxiety     Past Surgical History  Procedure Laterality Date  . Cesarean section  1998   Family History  Problem Relation Age of Onset  . Diabetes Mother   . Cancer Mother     Breast  . Depression Mother   . CAD Mother   . Heart failure Father   . Stroke Father   . Alcohol abuse Father   . Alcohol abuse Brother   . Drug abuse Brother     Social History   Social History  . Marital Status: Married    Spouse Name: N/A  . Number of Children: N/A  . Years of Education: N/A   Occupational History  . Not on file.   Social History Main Topics  . Smoking status: Never Smoker   . Smokeless tobacco: Never Used  . Alcohol Use: No  . Drug Use: No  . Sexual Activity:    Partners: Male   Other Topics Concern  . Not on file   Social History Narrative     Objective: BP 126/79 mmHg  Pulse 74  Temp(Src) 98.6 F (37 C) (Oral)  Wt 153 lb (69.4 kg)  General: Alert and Oriented, No Acute Distress HEENT: Pupils  equal, round, reactive to light. Conjunctivae clear.  External ears unremarkable, canals clear with intact TMs with appropriate landmarks.  Middle ear appears open without effusion. Pink inferior turbinates.  Moist mucous membranes, pharynx without inflammation nor lesions.  Single slightly tender lymph node underneath the middle of the chin, approximately half a centimeter in size. Cracking and ulceration in the corners of both lips. Lungs: clear and comfortable work of breathing Cardiac: Regular rate and rhythm.  Extremities: No peripheral edema.  Strong peripheral pulses.  Mental Status: No depression, anxiety, nor agitation. Skin: Warm and dry.  Assessment & Plan: Justise was seen today for lump under chin and cheilitis?.  Diagnoses and all orders for this visit:  Lip pain -     triamcinolone (KENALOG) 0.1 % paste; Use as directed 1 application in the mouth or throat 3 (three) times daily. For up to two weeks.  Other migraine without status migrainosus, not intractable  Other orders -     SUMAtriptan (IMITREX) 100 MG tablet; Take 0.5-1 tablets (50-100 mg total) by mouth every 2 (two) hours as needed for migraine. No more than two a day.   Cheilitis: start triamcinalone paste 3 times a day after meals for 2 weeks. If  the lymph node does not shrink by Thursday please call me.Signs and symptoms requring emergent/urgent reevaluation were discussed with the patient.  Return if symptoms worsen or fail to improve.

## 2015-02-13 ENCOUNTER — Other Ambulatory Visit (HOSPITAL_COMMUNITY): Payer: Self-pay | Admitting: Psychiatry

## 2015-02-13 NOTE — Telephone Encounter (Signed)
Received medication request for Prozac 40mg  from [facility]. Per Dr. Gilmore LarocheAkhtar, pt is authorized for a refill for Prozac 40mg , #60. Prescription was sent to pharmacy. Pt has a f/u appt on 02/25/15. Called and informed pt of prescription status. Pt verbalizes understanding.

## 2015-02-24 ENCOUNTER — Other Ambulatory Visit (HOSPITAL_COMMUNITY): Payer: Self-pay | Admitting: Psychiatry

## 2015-02-26 NOTE — Telephone Encounter (Signed)
Received medication refill request from [facility] for Wellbutrin 200mg , #30. Per Dr. Gilmore LarocheAkhtar, pt is authorized for a refill for Wellbutrin 200mg , #30. Prescription was sent to pharmacy. Pt has a f/up appt on 03/07/15. Called and informed pt of prescription status. PT verbalizes understanding.

## 2015-03-07 ENCOUNTER — Ambulatory Visit (INDEPENDENT_AMBULATORY_CARE_PROVIDER_SITE_OTHER): Payer: BLUE CROSS/BLUE SHIELD | Admitting: Psychiatry

## 2015-03-07 ENCOUNTER — Encounter (HOSPITAL_COMMUNITY): Payer: Self-pay | Admitting: Psychiatry

## 2015-03-07 VITALS — BP 124/66 | HR 77 | Ht 65.0 in | Wt 150.0 lb

## 2015-03-07 DIAGNOSIS — F329 Major depressive disorder, single episode, unspecified: Secondary | ICD-10-CM | POA: Diagnosis not present

## 2015-03-07 DIAGNOSIS — F32A Depression, unspecified: Secondary | ICD-10-CM

## 2015-03-07 DIAGNOSIS — F331 Major depressive disorder, recurrent, moderate: Secondary | ICD-10-CM

## 2015-03-07 DIAGNOSIS — F411 Generalized anxiety disorder: Secondary | ICD-10-CM | POA: Diagnosis not present

## 2015-03-07 DIAGNOSIS — R5383 Other fatigue: Secondary | ICD-10-CM

## 2015-03-07 MED ORDER — BUPROPION HCL ER (SR) 200 MG PO TB12: ORAL_TABLET | ORAL | Status: DC

## 2015-03-07 MED ORDER — FLUOXETINE HCL 40 MG PO CAPS: ORAL_CAPSULE | ORAL | Status: DC

## 2015-03-07 NOTE — Progress Notes (Signed)
Patient ID: Darlene Ochoa, female   DOB: 1965-09-29, 49 y.o.   MRN: [id number] [facility] MD Progress Note  03/07/2015 9:28 AM Darlene Ochoa  MRN:  [id number] Subjective:  Patient is in to follow up on depression and anxiety with fatigue. Has been [facility] patient MDD: On Prozac and Wellbutrin. Since Wellbutrin has been increased her depression continues  getting better. She has been found deficient in vitamin D and B-12 since she is getting that her depression including her energy level is also improved. Anxiety; improved not having any excessive worries or panic like symptoms she continues to take Prozac fatigue; improved since she is getting her vitamins   aggravating factor; her dad's death 8 years ago.  that in the past she has been diagnosed depression 15 years ago because of psychosocial stressors and depression. Her mom is in an assisted living facility She feels a fog since her Dad died and other stressors with the family at that time. But now has improved.   Modifying factors; the husband and having pets She is taking interest in things or energy level is improved sleep is fine  No psychotic symptoms   severity of depression; 8 out of  10 being no depression   Principal Problem: MDD with fatigue, GAD,  Diagnosis:   Patient Active Problem List   Diagnosis Date Noted  . Vitamin D deficiency [E55.9] 08/01/2014  . B12 deficiency [E53.8] 08/01/2014  . Prolonged Q-T interval on ECG [I45.81] 10/06/2013  . Major depressive disorder, recurrent episode, moderate (HCC) [F33.1] 12/27/2012  . Generalized anxiety disorder [F41.1] 12/27/2012  . Anxiety [F41.9] 04/27/2012  . Essential hypertension [I10] 04/27/2012  . Major depressive disorder (HCC) [F32.9] 03/31/2012  . Chest pain [R07.9] 03/31/2012  . Shortness of breath [R06.02] 03/31/2012  . Migraine [G43.909] 03/31/2012  . PALPITATIONS [R00.2] 08/02/2009   Total Time spent with patient: 30 minutes   Past Medical History:  Past Medical History   Diagnosis Date  . Depression   . Seasonal allergies   . Hypertension   . Major depressive disorder (HCC) 03/31/2012  . Chest pain 03/31/2012  . H/O prolonged Q-T interval on ECG 03/31/2012  . Anxiety     Past Surgical History  Procedure Laterality Date  . Cesarean section  1998   Family History:  Family History  Problem Relation Age of Onset  . Diabetes Mother   . Cancer Mother     Breast  . Depression Mother   . CAD Mother   . Heart failure Father   . Stroke Father   . Alcohol abuse Father   . Alcohol abuse Brother   . Drug abuse Brother    Social History:  History  Alcohol Use No     History  Drug Use No    Social History   Social History  . Marital Status: Married    Spouse Name: N/A  . Number of Children: N/A  . Years of Education: N/A   Social History Main Topics  . Smoking status: Never Smoker   . Smokeless tobacco: Never Used  . Alcohol Use: No  . Drug Use: No  . Sexual Activity:    Partners: Male   Other Topics Concern  . None   Social History Narrative   Additional History:    Sleep: Good  Appetite:  Good   Assessment:   Musculoskeletal: Strength & Muscle Tone: within normal limits Gait & Station: normal Patient leans: normal   Psychiatric Specialty Exam: Physical Exam  Review of Systems  Constitutional: Negative for fever and malaise/fatigue.  Skin: Negative for rash.  Neurological: Negative for tremors and headaches.  Psychiatric/Behavioral: Negative for depression and suicidal ideas. The patient is not nervous/anxious.   All other systems reviewed and are negative.   Blood pressure 124/66, pulse 77, height 5\' 5"  (1.651 m), weight 150 lb (68.04 kg), SpO2 98 %.Body mass index is 24.96 kg/(m^2).  General Appearance: Well Groomed  Patent attorneyye Contact::  Good  Speech:  Clear and Coherent  Volume:  Normal  Mood:  Euthymic  Affect:  Congruent  Thought Process:  Coherent, Goal Directed, Intact, Linear and Logical  Orientation:  Full  (Time, Place, and Person)  Thought Content:  WDL  Suicidal Thoughts:  No  Homicidal Thoughts:  No  Memory:  Immediate;   Good Recent;   Good Remote;   Good  Judgement:  Intact  Insight:  Good  Psychomotor Activity:  Normal  Concentration:  Good  Recall:  Good  Fund of Knowledge:Good  Language: Good  Akathisia:  No  Handed:  Right  AIMS (if indicated):     Assets:  Communication Skills Desire for Improvement Financial Resources/Insurance Housing Intimacy Leisure Time Physical Health Resilience Social Support Talents/Skills Transportation  ADL's:  Intact  Cognition: WNL  Sleep:        Current Medications: Current Outpatient Prescriptions  Medication Sig Dispense Refill  . buPROPion (WELLBUTRIN SR) 200 MG 12 hr tablet TAKE 1 TABLET (200 MG TOTAL) BY MOUTH DAILY. 30 tablet 1  . FLUoxetine (PROZAC) 40 MG capsule TAKE 2 CAPSULES (80 MG TOTAL) BY MOUTH DAILY. 60 capsule 1  . hydrochlorothiazide (HYDRODIURIL) 25 MG tablet Take 1 tablet (25 mg total) by mouth daily. 90 tablet 2  . lidocaine (XYLOCAINE) 5 % ointment Apply to burns every six hours as needed 50 g 2  . loratadine (CLARITIN) 10 MG tablet Take 10 mg by mouth daily.    Marland Kitchen. MINASTRIN 24 FE 1-20 MG-MCG(24) CHEW Chew 1 tablet by mouth Daily.    Marland Kitchen. nystatin (MYCOSTATIN) 100000 UNIT/ML suspension Take 5 mLs (500,000 Units total) by mouth 4 (four) times daily. Swish fifteen seconds and swallow. Use for up to one day after symptoms resolve. 240 mL 1  . SUMAtriptan (IMITREX) 100 MG tablet Take 0.5-1 tablets (50-100 mg total) by mouth every 2 (two) hours as needed for migraine. No more than two a day. 10 tablet 11  . triamcinolone (KENALOG) 0.1 % paste Use as directed 1 application in the mouth or throat 3 (three) times daily. For up to two weeks. 5 g 12  . vitamin B-12 (CYANOCOBALAMIN) 1000 MCG tablet Take 1 tablet (1,000 mcg total) by mouth daily. 30 tablet 1  . Vitamin D, Ergocalciferol, (DRISDOL) 50000 UNITS CAPS capsule Take  1 capsule (50,000 Units total) by mouth every 7 (seven) days. Recheck Vitamin D in 3 Months 12 capsule 0  . [DISCONTINUED] clonazePAM (KLONOPIN) 0.5 MG tablet TAKE 1 TABLET BY MOUTH EVERY DAY AS NEEDED FOR ANXIETY 30 tablet 0   No current facility-administered medications for this visit.    Lab Results: reviewed Vit. D3, FE levels Physical Findings: AIMS:  CIWA:    COWS:     Treatment Plan Summary: Continue Prozac and Wellbutrin for depression  Continue Prozac for anxiety Fatigue: improved with wellbutrin and vitamins  Call 911 or report local ED for any urgent concerns or suicidal thoughts.  More than 50% time spent in counseling and coordination of care including patient education Prescription refill sent follow-up in 2  or 3 months or earlier if needed     Lluvia Gwynne 03/07/2015, 9:28 AM

## 2015-03-31 ENCOUNTER — Other Ambulatory Visit (HOSPITAL_COMMUNITY): Payer: Self-pay | Admitting: Psychiatry

## 2015-04-02 NOTE — Telephone Encounter (Signed)
Received medication request from [facility] for Wellbutrin 200mg . Per Dr. Gilmore LarocheAkhtar, medication request is denied. Pt received a refill for Wellbutrin 200mg , #30 w/ 1 refill on 03/07/15. Pt has a f/u appt on 05/21/15.

## 2015-04-21 ENCOUNTER — Other Ambulatory Visit: Payer: Self-pay | Admitting: Family Medicine

## 2015-05-16 ENCOUNTER — Ambulatory Visit (INDEPENDENT_AMBULATORY_CARE_PROVIDER_SITE_OTHER): Payer: BLUE CROSS/BLUE SHIELD | Admitting: Family Medicine

## 2015-05-16 ENCOUNTER — Encounter: Payer: Self-pay | Admitting: Family Medicine

## 2015-05-16 VITALS — BP 126/89 | Temp 98.8°F | Wt 155.0 lb

## 2015-05-16 DIAGNOSIS — R059 Cough, unspecified: Secondary | ICD-10-CM

## 2015-05-16 DIAGNOSIS — I10 Essential (primary) hypertension: Secondary | ICD-10-CM

## 2015-05-16 DIAGNOSIS — M25531 Pain in right wrist: Secondary | ICD-10-CM

## 2015-05-16 DIAGNOSIS — R05 Cough: Secondary | ICD-10-CM

## 2015-05-16 DIAGNOSIS — M25532 Pain in left wrist: Secondary | ICD-10-CM | POA: Diagnosis not present

## 2015-05-16 MED ORDER — HYDROCHLOROTHIAZIDE 25 MG PO TABS: ORAL_TABLET | ORAL | Status: DC

## 2015-05-16 MED ORDER — AZITHROMYCIN 250 MG PO TABS
ORAL_TABLET | ORAL | Status: AC
Start: 2015-05-16 — End: 2015-05-21

## 2015-05-16 NOTE — Progress Notes (Signed)
CC: Darlene Ochoa is a 50 y.o. female is here for Nasal Congestion and Wrist Pain   Subjective: HPI:   complains of nonproductive cough present for the last week and a half. It's present on a daily basis and worsening every other day. It's moderate in severity. It does not interfere with sleep. No interventions as of yet other than waiting. She's also had some mild shortness of breath and wheezing. She denies fevers, chills, rash. She is also experiencing some nasal congestion and postnasal drip.   Bilateral wrist pain off and on for matter of years. Over the past 2 or 3 weeks it's been more problematic. It's localized at the base of the thumb and is nonradiating. It's sharp and comes on suddenly with any sudden twisting motion of the wrist. She denies any recent or remote trauma. No interventions as of yet.   Requesting refills of hydrochlorothiazide. No outside blood pressures to report. Denies chest pain orthopnea or peripheral edema.   Review Of Systems Outlined In HPI  Past Medical History  Diagnosis Date  . Depression   . Seasonal allergies   . Hypertension   . Major depressive disorder (HCC) 03/31/2012  . Chest pain 03/31/2012  . H/O prolonged Q-T interval on ECG 03/31/2012  . Anxiety     Past Surgical History  Procedure Laterality Date  . Cesarean section  1998   Family History  Problem Relation Age of Onset  . Diabetes Mother   . Cancer Mother     Breast  . Depression Mother   . CAD Mother   . Heart failure Father   . Stroke Father   . Alcohol abuse Father   . Alcohol abuse Brother   . Drug abuse Brother     Social History   Social History  . Marital Status: Married    Spouse Name: N/A  . Number of Children: N/A  . Years of Education: N/A   Occupational History  . Not on file.   Social History Main Topics  . Smoking status: Never Smoker   . Smokeless tobacco: Never Used  . Alcohol Use: No  . Drug Use: No  . Sexual Activity:    Partners: Male   Other  Topics Concern  . Not on file   Social History Narrative     Objective: BP 126/89 mmHg  Temp(Src) 98.8 F (37.1 C) (Oral)  Wt 155 lb (70.308 kg)  General: Alert and Oriented, No Acute Distress HEENT: Pupils equal, round, reactive to light. Conjunctivae clear.  External ears unremarkable, canals clear with intact TMs with appropriate landmarks.  Middle ear appears open without effusion. Pink inferior turbinates.  Moist mucous membranes, pharynx without inflammation nor lesions.  Neck supple without palpable lymphadenopathy nor abnormal masses. Lungs: Clear to auscultation bilaterally, no wheezing/ronchi/rales.  Comfortable work of breathing. Good air movement. Cardiac: Regular rate and rhythm. Normal S1/S2.  No murmurs, rubs, nor gallops.   Extremities: No peripheral edema.  Strong peripheral pulses.  No swelling redness or warmth of either wrist. Finkelstein's is positive bilaterally. Mental Status: No depression, anxiety, nor agitation. Skin: Warm and dry.  Assessment & Plan: [name] was seen today for nasal congestion and wrist pain.  Diagnoses and all orders for this visit:  Pain in both wrists  Cough -     azithromycin (ZITHROMAX) 250 MG tablet; Take two tabs at once on day 1, then one tab daily on days 2-5.  Essential hypertension -     hydrochlorothiazide (HYDRODIURIL) 25 MG  tablet; TAKE 1 TABLET (25 MG TOTAL) BY MOUTH DAILY.   Wrist pain: She was given home rehabilitation exercises for tendinitis of the wrist. If no better in 2 weeks follow-up for mobilization. Cough: Start azithromycin for suspected bronchitis. Low suspicion of pneumonia Essential hypertension: Controlled continue HCTZ  25 minutes spent face-to-face during visit today of which at least 50% was counseling or coordinating care regarding: 1. Pain in both wrists   2. Cough   3. Essential hypertension      Return if symptoms worsen or fail to improve.

## 2015-05-21 ENCOUNTER — Ambulatory Visit (HOSPITAL_COMMUNITY): Payer: BLUE CROSS/BLUE SHIELD | Admitting: Psychiatry

## 2015-05-23 ENCOUNTER — Encounter: Payer: Self-pay | Admitting: Family Medicine

## 2015-05-23 ENCOUNTER — Ambulatory Visit (INDEPENDENT_AMBULATORY_CARE_PROVIDER_SITE_OTHER): Payer: BLUE CROSS/BLUE SHIELD | Admitting: Family Medicine

## 2015-05-23 VITALS — BP 125/84 | HR 80 | Temp 99.2°F | Wt 154.0 lb

## 2015-05-23 DIAGNOSIS — R05 Cough: Secondary | ICD-10-CM | POA: Diagnosis not present

## 2015-05-23 DIAGNOSIS — R059 Cough, unspecified: Secondary | ICD-10-CM

## 2015-05-23 MED ORDER — LEVOFLOXACIN 500 MG PO TABS: 500.0000 mg | ORAL_TABLET | Freq: Every day | ORAL | Status: DC

## 2015-05-23 NOTE — Progress Notes (Signed)
CC: Darlene Ochoa is a 50 y.o. female is here for URI   Subjective: HPI:  Nonproductive cough, facial pressure in the cheeks and nasal congestion that has been present for a week and a half now. It started to get somewhat better when she was taking azithromycin however when she ran out of the medication abruptly got worse. It's present on a daily basis and worse in the evening. Slight benefit from Robitussin nothing else makes better or worse. Denies wheezing, chest pain or shortness of breath. Denies fevers or chills   Review Of Systems Outlined In HPI  Past Medical History  Diagnosis Date  . Depression   . Seasonal allergies   . Hypertension   . Major depressive disorder (HCC) 03/31/2012  . Chest pain 03/31/2012  . H/O prolonged Q-T interval on ECG 03/31/2012  . Anxiety     Past Surgical History  Procedure Laterality Date  . Cesarean section  1998   Family History  Problem Relation Age of Onset  . Diabetes Mother   . Cancer Mother     Breast  . Depression Mother   . CAD Mother   . Heart failure Father   . Stroke Father   . Alcohol abuse Father   . Alcohol abuse Brother   . Drug abuse Brother     Social History   Social History  . Marital Status: Married    Spouse Name: N/A  . Number of Children: N/A  . Years of Education: N/A   Occupational History  . Not on file.   Social History Main Topics  . Smoking status: Never Smoker   . Smokeless tobacco: Never Used  . Alcohol Use: No  . Drug Use: No  . Sexual Activity:    Partners: Male   Other Topics Concern  . Not on file   Social History Narrative     Objective: BP 125/84 mmHg  Pulse 80  Temp(Src) 99.2 F (37.3 C) (Oral)  Wt 154 lb (69.854 kg)  General: Alert and Oriented, No Acute Distress HEENT: Pupils equal, round, reactive to light. Conjunctivae clear.  External ears unremarkable, canals clear with intact TMs with appropriate landmarks.  Middle ear appears open without effusion. Pink inferior  turbinates.  Moist mucous membranes, pharynx without inflammation nor lesions other than postnasal drip.  Neck supple without palpable lymphadenopathy nor abnormal masses. Lungs: Clear to auscultation bilaterally, no wheezing/ronchi/rales.  Comfortable work of breathing. Good air movement. Extremities: No peripheral edema.  Strong peripheral pulses.  Mental Status: No depression, anxiety, nor agitation. Skin: Warm and dry.  Assessment & Plan: Darlene Ochoa was seen today for uri.  Diagnoses and all orders for this visit:  Cough -     levofloxacin (LEVAQUIN) 500 MG tablet; Take 1 tablet (500 mg total) by mouth daily.   Cough most likely due to bacterial sinusitis resistant to azithromycin therefore start levofloxacin. Consider nasal saline washes   No Follow-up on file.

## 2015-05-29 ENCOUNTER — Other Ambulatory Visit (HOSPITAL_COMMUNITY): Payer: Self-pay | Admitting: Psychiatry

## 2015-06-01 NOTE — Telephone Encounter (Signed)
Received medication refill request from [facility] for Wellbutrin . Per Dr. Gilmore Laroche, refill is authorized for Wellbutrin , #30. Prescription was sent to pharmacy. Pt is schedule for a f/u appt on 2/14. Called and informed pt of prescription status. Pt verbalizes understanding.

## 2015-06-05 ENCOUNTER — Encounter (HOSPITAL_COMMUNITY): Payer: Self-pay | Admitting: Psychiatry

## 2015-06-05 ENCOUNTER — Ambulatory Visit (INDEPENDENT_AMBULATORY_CARE_PROVIDER_SITE_OTHER): Payer: BLUE CROSS/BLUE SHIELD | Admitting: Psychiatry

## 2015-06-05 VITALS — BP 122/70 | HR 86 | Ht 65.0 in | Wt 151.0 lb

## 2015-06-05 DIAGNOSIS — F331 Major depressive disorder, recurrent, moderate: Secondary | ICD-10-CM | POA: Diagnosis not present

## 2015-06-05 DIAGNOSIS — F329 Major depressive disorder, single episode, unspecified: Secondary | ICD-10-CM

## 2015-06-05 DIAGNOSIS — F411 Generalized anxiety disorder: Secondary | ICD-10-CM | POA: Diagnosis not present

## 2015-06-05 DIAGNOSIS — R5383 Other fatigue: Secondary | ICD-10-CM | POA: Diagnosis not present

## 2015-06-05 DIAGNOSIS — F32A Depression, unspecified: Secondary | ICD-10-CM

## 2015-06-05 MED ORDER — FLUOXETINE HCL 40 MG PO CAPS: ORAL_CAPSULE | ORAL | Status: DC

## 2015-06-05 NOTE — Progress Notes (Signed)
Patient ID: Darlene Ochoa, female   DOB: 06/19/1965, 50 y.o.   MRN: [id number] [facility] MD Progress Note  06/05/2015 9:37 AM Darlene Ochoa  MRN:  [id number] Subjective:  Patient is in to follow up on depression and anxiety with fatigue. Has been [facility] patient MDD: On Prozac and Wellbutrin. Since Wellbutrin has been increased her depression continues  getting better. She does have afternoon dip or feeling down but overall better . She has been found deficient in vitamin D and B-12 since she is getting that her depression has improved. Anxiety; improved not having any excessive worries or panic like symptoms she continues to take Prozac fatigue; not worsened. On vitamins  aggravating factor; her dad's death 9 years ago.  that in the past she has been diagnosed depression 15 years ago because of psychosocial stressors and depression. Her mom is in an assisted living facility She feels a fog since her Dad died and other stressors with the family at that time. But now has improved.   Modifying factors; the husband and having pets She is taking interest in things or energy level is improved sleep is fine  No psychotic symptoms   severity of depression; 8 out of  10 being no depression   Principal Problem: MDD with fatigue, GAD,  Diagnosis:   Patient Active Problem List   Diagnosis Date Noted  . Vitamin D deficiency [E55.9] 08/01/2014  . B12 deficiency [E53.8] 08/01/2014  . Prolonged Q-T interval on ECG [I45.81] 10/06/2013  . Major depressive disorder, recurrent episode, moderate (HCC) [F33.1] 12/27/2012  . Generalized anxiety disorder [F41.1] 12/27/2012  . Anxiety [F41.9] 04/27/2012  . Essential hypertension [I10] 04/27/2012  . Major depressive disorder (HCC) [F32.9] 03/31/2012  . Chest pain [R07.9] 03/31/2012  . Shortness of breath [R06.02] 03/31/2012  . Migraine [G43.909] 03/31/2012  . PALPITATIONS [R00.2] 08/02/2009   Total Time spent with patient: 30 minutes   Past Medical History:  Past  Medical History  Diagnosis Date  . Depression   . Seasonal allergies   . Hypertension   . Major depressive disorder (HCC) 03/31/2012  . Chest pain 03/31/2012  . H/O prolonged Q-T interval on ECG 03/31/2012  . Anxiety     Past Surgical History  Procedure Laterality Date  . Cesarean section  1998   Family History:  Family History  Problem Relation Age of Onset  . Diabetes Mother   . Cancer Mother     Breast  . Depression Mother   . CAD Mother   . Heart failure Father   . Stroke Father   . Alcohol abuse Father   . Alcohol abuse Brother   . Drug abuse Brother    Social History:  History  Alcohol Use No     History  Drug Use No    Social History   Social History  . Marital Status: Married    Spouse Name: N/A  . Number of Children: N/A  . Years of Education: N/A   Social History Main Topics  . Smoking status: Never Smoker   . Smokeless tobacco: Never Used  . Alcohol Use: No  . Drug Use: No  . Sexual Activity:    Partners: Male   Other Topics Concern  . None   Social History Narrative   Additional History:    Sleep: Good  Appetite:  Good   Assessment:   Musculoskeletal: Strength & Muscle Tone: within normal limits Gait & Station: normal Patient leans: normal   Psychiatric Specialty Exam: Physical Exam  Review of Systems  Constitutional: Negative for fever and malaise/fatigue.  Skin: Negative for rash.  Neurological: Negative for tremors and headaches.  Psychiatric/Behavioral: Negative for depression and suicidal ideas. The patient is not nervous/anxious.   All other systems reviewed and are negative.   Blood pressure 122/70, pulse 86, height  (1.651 m), weight 151 lb (68.493 kg), SpO2 97 %.Body mass index is 25.13 kg/(m^2).  General Appearance: Well Groomed  Patent attorney::  Good  Speech:  Clear and Coherent  Volume:  Normal  Mood:  Euthymic  Affect:  Congruent  Thought Process:  Coherent, Goal Directed, Intact, Linear and Logical   Orientation:  Full (Time, Place, and Person)  Thought Content:  WDL  Suicidal Thoughts:  No  Homicidal Thoughts:  No  Memory:  Immediate;   Good Recent;   Good Remote;   Good  Judgement:  Intact  Insight:  Good  Psychomotor Activity:  Normal  Concentration:  Good  Recall:  Good  Fund of Knowledge:Good  Language: Good  Akathisia:  No  Handed:  Right  AIMS (if indicated):     Assets:  Communication Skills Desire for Improvement Financial Resources/Insurance Housing Intimacy Leisure Time Physical Health Resilience Social Support Talents/Skills Transportation  ADL's:  Intact  Cognition: WNL  Sleep:        Current Medications: Current Outpatient Prescriptions  Medication Sig Dispense Refill  . buPROPion (WELLBUTRIN SR) 200 MG 12 hr tablet TAKE 1 TABLET (200 MG TOTAL) BY MOUTH DAILY. 30 tablet 0  . FLUoxetine (PROZAC) 40 MG capsule TAKE 2 CAPSULES (80 MG TOTAL) BY MOUTH DAILY. 60 capsule 1  . hydrochlorothiazide (HYDRODIURIL) 25 MG tablet TAKE 1 TABLET (25 MG TOTAL) BY MOUTH DAILY. 90 tablet 0  . levofloxacin (LEVAQUIN) 500 MG tablet Take 1 tablet (500 mg total) by mouth daily. 8 tablet 0  . loratadine (CLARITIN) 10 MG tablet Take 10 mg by mouth daily.    Marland Kitchen MINASTRIN 24 FE 1-20 MG-MCG(24) CHEW Chew 1 tablet by mouth Daily.    Marland Kitchen nystatin (MYCOSTATIN) 100000 UNIT/ML suspension Take 5 mLs (500,000 Units total) by mouth 4 (four) times daily. Swish fifteen seconds and swallow. Use for up to one day after symptoms resolve. 240 mL 1  . SUMAtriptan (IMITREX) 100 MG tablet Take 0.5-1 tablets (50-100 mg total) by mouth every 2 (two) hours as needed for migraine. No more than two a day. 10 tablet 11  . vitamin B-12 (CYANOCOBALAMIN) 1000 MCG tablet Take 1 tablet (1,000 mcg total) by mouth daily. 30 tablet 1  . Vitamin D, Ergocalciferol, (DRISDOL) 50000 UNITS CAPS capsule Take 1 capsule (50,000 Units total) by mouth every 7 (seven) days. Recheck Vitamin D in 3 Months 12 capsule 0  .  [DISCONTINUED] clonazePAM (KLONOPIN) 0.5 MG tablet TAKE 1 TABLET BY MOUTH EVERY DAY AS NEEDED FOR ANXIETY 30 tablet 0   No current facility-administered medications for this visit.    Lab Results: reviewed Vit. D3, FE levels Physical Findings: AIMS:  CIWA:    COWS:     Treatment Plan Summary: Continue Prozac and Wellbutrin for depression.   Continue Prozac for anxiety Fatigue: improved with wellbutrin and vitamins  Call 911 or report local ED for any urgent concerns or suicidal thoughts.  More than 50% time spent in counseling and coordination of care including patient education Prescription refill sent follow-up in 2 or 3 months or earlier if needed     Abhiraj Dozal 06/05/2015, 9:37 AM

## 2015-06-08 NOTE — Telephone Encounter (Signed)
Received medication request from [facility] for Wellbutrin . Per Dr. Alta Corning, medication request is denied. Pt received medication refill on 06/01/15.

## 2015-06-13 ENCOUNTER — Other Ambulatory Visit (HOSPITAL_COMMUNITY): Payer: Self-pay | Admitting: Psychiatry

## 2015-06-15 LAB — HM PAP SMEAR: HM Pap smear: NEGATIVE

## 2015-06-18 NOTE — Telephone Encounter (Signed)
Received medication request from [facility] for Prozac . Per Dr. Gilmore Laroche, pt is authorized is for a refill for Prozac , #60. Prescription was sent to pharmacy. Called and informed pt of prescription status. Pt verbalizes understanding.

## 2015-06-26 ENCOUNTER — Other Ambulatory Visit: Payer: Self-pay | Admitting: Family Medicine

## 2015-07-02 ENCOUNTER — Other Ambulatory Visit (HOSPITAL_COMMUNITY): Payer: Self-pay | Admitting: Psychiatry

## 2015-07-03 NOTE — Telephone Encounter (Signed)
Received medication request from [facility] for Wellbutrin 200mg . Per Dr. Gilmore LarocheAkhtar, please send refill for Wellbutrin 200mg , #30 to pharmacy. Pt is schedule for a f/u appt on 07/27/15. Called and informed pt of prescription status. Pt verbalizes understanding.

## 2015-07-27 ENCOUNTER — Ambulatory Visit (HOSPITAL_COMMUNITY): Payer: Self-pay | Admitting: Psychiatry

## 2015-08-01 DIAGNOSIS — F411 Generalized anxiety disorder: Secondary | ICD-10-CM | POA: Diagnosis not present

## 2015-08-08 ENCOUNTER — Encounter (HOSPITAL_COMMUNITY): Payer: Self-pay | Admitting: Psychiatry

## 2015-08-08 ENCOUNTER — Ambulatory Visit (INDEPENDENT_AMBULATORY_CARE_PROVIDER_SITE_OTHER): Payer: BLUE CROSS/BLUE SHIELD | Admitting: Psychiatry

## 2015-08-08 VITALS — BP 124/68 | HR 86 | Ht 65.0 in | Wt 157.0 lb

## 2015-08-08 DIAGNOSIS — R5383 Other fatigue: Secondary | ICD-10-CM | POA: Diagnosis not present

## 2015-08-08 DIAGNOSIS — F331 Major depressive disorder, recurrent, moderate: Secondary | ICD-10-CM | POA: Diagnosis not present

## 2015-08-08 DIAGNOSIS — F411 Generalized anxiety disorder: Secondary | ICD-10-CM

## 2015-08-08 DIAGNOSIS — F329 Major depressive disorder, single episode, unspecified: Secondary | ICD-10-CM

## 2015-08-08 MED ORDER — FLUOXETINE HCL 40 MG PO CAPS: 80.0000 mg | ORAL_CAPSULE | Freq: Every day | ORAL | Status: DC

## 2015-08-08 MED ORDER — BUPROPION HCL ER (SR) 200 MG PO TB12: ORAL_TABLET | ORAL | Status: DC

## 2015-08-08 NOTE — Progress Notes (Signed)
Patient ID: Darlene Ochoa, female   DOB: Jul 23, 1965, 50 y.o.   MRN: [id number] [facility] MD Progress Note  08/08/2015 9:03 AM Darlene Ochoa  MRN:  [id number] Subjective:  Patient is in to follow up on depression and anxiety with fatigue. Has been [facility] patient MDD: On Prozac and Wellbutrin. Once Wellbutrin was increased her depression and food intake improved.  Now she is feeling somewhat after known tiredness she cannot pinpoint it but overall motivation is there but she does not have the energy to do things at times.  She has been found deficient in vitamin D and B-12 since she is getting that and planning to change timing of meds to see its effect on energy.  Anxiety; not worsened  fatigue; afternoon mostly. On vitamins   aggravating factor; her dad's death 10 years ago. Her mom is in an assisted living facility  Modifying factors; the husband and having pets She is taking interest in things but still has low energy at times. No psychotic symptoms   severity of depression; 8 out of  10 being no depression   Principal Problem: MDD with fatigue, GAD,  Diagnosis:   Patient Active Problem List   Diagnosis Date Noted  . Vitamin D deficiency [E55.9] 08/01/2014  . B12 deficiency [E53.8] 08/01/2014  . Prolonged Q-T interval on ECG [I45.81] 10/06/2013  . Major depressive disorder, recurrent episode, moderate (HCC) [F33.1] 12/27/2012  . Generalized anxiety disorder [F41.1] 12/27/2012  . Anxiety [F41.9] 04/27/2012  . Essential hypertension [I10] 04/27/2012  . Major depressive disorder (HCC) [F32.9] 03/31/2012  . Chest pain [R07.9] 03/31/2012  . Shortness of breath [R06.02] 03/31/2012  . Migraine [G43.909] 03/31/2012  . PALPITATIONS [R00.2] 08/02/2009   Total Time spent with patient: 30 minutes   Past Medical History:  Past Medical History  Diagnosis Date  . Depression   . Seasonal allergies   . Hypertension   . Major depressive disorder (HCC) 03/31/2012  . Chest pain 03/31/2012  . H/O  prolonged Q-T interval on ECG 03/31/2012  . Anxiety     Past Surgical History  Procedure Laterality Date  . Cesarean section  1998   Family History:  Family History  Problem Relation Age of Onset  . Diabetes Mother   . Cancer Mother     Breast  . Depression Mother   . CAD Mother   . Heart failure Father   . Stroke Father   . Alcohol abuse Father   . Alcohol abuse Brother   . Drug abuse Brother    Social History:  History  Alcohol Use No     History  Drug Use No    Social History   Social History  . Marital Status: Married    Spouse Name: N/A  . Number of Children: N/A  . Years of Education: N/A   Social History Main Topics  . Smoking status: Never Smoker   . Smokeless tobacco: Never Used  . Alcohol Use: No  . Drug Use: No  . Sexual Activity:    Partners: Male   Other Topics Concern  . None   Social History Narrative    Musculoskeletal: Strength & Muscle Tone: within normal limits Gait & Station: normal Patient leans: normal   Psychiatric Specialty Exam: Physical Exam  Review of Systems  Constitutional: Positive for malaise/fatigue. Negative for fever.  Cardiovascular: Negative for chest pain.  Skin: Negative for rash.  Neurological: Negative for tremors.  Psychiatric/Behavioral: Negative for depression and suicidal ideas. The patient is not nervous/anxious.  All other systems reviewed and are negative.   Blood pressure 124/68, pulse 86, height 5\' 5"  (1.651 m), weight 157 lb (71.215 kg), SpO2 98 %.Body mass index is 26.13 kg/(m^2).  General Appearance: Well Groomed  Patent attorneyye Contact::  Good  Speech:  Clear and Coherent  Volume:  Normal  Mood:  Euthymic  Affect:  Congruent  Thought Process:  Coherent, Goal Directed, Intact, Linear and Logical  Orientation:  Full (Time, Place, and Person)  Thought Content:  WDL  Suicidal Thoughts:  No  Homicidal Thoughts:  No  Memory:  Immediate;   Good Recent;   Good Remote;   Good  Judgement:  Intact   Insight:  Good  Psychomotor Activity:  Normal  Concentration:  Good  Recall:  Good  Fund of Knowledge:Good  Language: Good  Akathisia:  No  Handed:  Right  AIMS (if indicated):     Assets:  Communication Skills Desire for Improvement Financial Resources/Insurance Housing Intimacy Leisure Time Physical Health Resilience Social Support Talents/Skills Transportation  ADL's:  Intact  Cognition: WNL  Sleep:        Current Medications: Current Outpatient Prescriptions  Medication Sig Dispense Refill  . buPROPion (WELLBUTRIN SR) 200 MG 12 hr tablet TAKE 1 TABLET (200 MG TOTAL) BY MOUTH DAILY. 30 tablet 1  . FLUoxetine (PROZAC) 40 MG capsule Take 2 capsules (80 mg total) by mouth daily. 60 capsule 1  . hydrochlorothiazide (HYDRODIURIL) 25 MG tablet TAKE 1 TABLET (25 MG TOTAL) BY MOUTH DAILY. 90 tablet 0  . hydrochlorothiazide (HYDRODIURIL) 25 MG tablet TAKE 1 TABLET (25 MG TOTAL) BY MOUTH DAILY. 90 tablet 0  . levofloxacin (LEVAQUIN) 500 MG tablet Take 1 tablet (500 mg total) by mouth daily. 8 tablet 0  . loratadine (CLARITIN) 10 MG tablet Take 10 mg by mouth daily.    Marland Kitchen. MINASTRIN 24 FE 1-20 MG-MCG(24) CHEW Chew 1 tablet by mouth Daily.    . SUMAtriptan (IMITREX) 100 MG tablet Take 0.5-1 tablets (50-100 mg total) by mouth every 2 (two) hours as needed for migraine. No more than two a day. 10 tablet 11  . vitamin B-12 (CYANOCOBALAMIN) 1000 MCG tablet Take 1 tablet (1,000 mcg total) by mouth daily. 30 tablet 1  . Vitamin D, Ergocalciferol, (DRISDOL) 50000 UNITS CAPS capsule Take 1 capsule (50,000 Units total) by mouth every 7 (seven) days. Recheck Vitamin D in 3 Months 12 capsule 0  . nystatin (MYCOSTATIN) 100000 UNIT/ML suspension Take 5 mLs (500,000 Units total) by mouth 4 (four) times daily. Swish fifteen seconds and swallow. Use for up to one day after symptoms resolve. 240 mL 1  . [DISCONTINUED] clonazePAM (KLONOPIN) 0.5 MG tablet TAKE 1 TABLET BY MOUTH EVERY DAY AS NEEDED FOR  ANXIETY 30 tablet 0   No current facility-administered medications for this visit.    Lab Results: Physical Findings: AIMS:  CIWA:    COWS:     Treatment Plan Summary: Continue Prozac and Wellbutrin for depression. Planning not to increase since prozac on max dose.  Prescriptions sent.   Continue Prozac for anxiety Fatigue: improved in the beginning . She will work with primary care to consider any change in vitamins needed.  Also will increase physical activity. May take power nap during afternoon.  Call 911 or report local ED for any urgent concerns or suicidal thoughts.  More than 50% time spent in counseling and coordination of care including patient education Prescription refill sent follow-up in 2 or 3 months or earlier if needed  Arabelle Bollig 08/08/2015, 9:03 AM

## 2015-08-31 ENCOUNTER — Ambulatory Visit (INDEPENDENT_AMBULATORY_CARE_PROVIDER_SITE_OTHER): Payer: BLUE CROSS/BLUE SHIELD | Admitting: Family Medicine

## 2015-08-31 ENCOUNTER — Encounter: Payer: Self-pay | Admitting: Family Medicine

## 2015-08-31 VITALS — BP 130/84 | HR 69 | Wt 156.0 lb

## 2015-08-31 DIAGNOSIS — S6992XA Unspecified injury of left wrist, hand and finger(s), initial encounter: Secondary | ICD-10-CM

## 2015-08-31 DIAGNOSIS — F411 Generalized anxiety disorder: Secondary | ICD-10-CM | POA: Diagnosis not present

## 2015-08-31 MED ORDER — MELOXICAM 15 MG PO TABS: 15.0000 mg | ORAL_TABLET | Freq: Every day | ORAL | Status: DC

## 2015-08-31 NOTE — Progress Notes (Signed)
CC: Darlene Ochoa Petrosino is a [age] y.o. female is here for Finger Injury   Subjective: HPI:  On Wednesday she was hammering nails and hammers correct the pad of her left middle finger. She had immediate pain and pain is persistent. It's improved if she wraps the finger in a loose brace however it's worse when exposed. Its pulsatile when painful. It's described as a throbbing moderate in severity. No benefit from ice baths or nonsteroidal anti-inflammatory such as Motrin. She denies any joint pain but has pain with bending the DIP. She denies joint pain elsewhere.   Review Of Systems Outlined In HPI  Past Medical History  Diagnosis Date  . Depression   . Seasonal allergies   . Hypertension   . Major depressive disorder (HCC) 03/31/2012  . Chest pain 03/31/2012  . H/O prolonged Q-T interval on ECG 03/31/2012  . Anxiety     Past Surgical History  Procedure Laterality Date  . Cesarean section  1998   Family History  Problem Relation Age of Onset  . Diabetes Mother   . Cancer Mother     Breast  . Depression Mother   . CAD Mother   . Heart failure Father   . Stroke Father   . Alcohol abuse Father   . Alcohol abuse Brother   . Drug abuse Brother     Social History   Social History  . Marital Status: Married    Spouse Name: N/A  . Number of Children: N/A  . Years of Education: N/A   Occupational History  . Not on file.   Social History Main Topics  . Smoking status: Never Smoker   . Smokeless tobacco: Never Used  . Alcohol Use: No  . Drug Use: No  . Sexual Activity:    Partners: Male   Other Topics Concern  . Not on file   Social History Narrative     Objective: BP 130/84 mmHg  Pulse 69  Wt 156 lb (70.761 kg)  Vital signs reviewed. General: Alert and Oriented, No Acute Distress HEENT: Pupils equal, round, reactive to light. Conjunctivae clear.  External ears unremarkable.  Moist mucous membranes. Lungs: Clear and comfortable work of breathing, speaking in full  sentences without accessory muscle use. Cardiac: Regular rate and rhythm.  Neuro: CN II-XII grossly intact, gait normal. Extremities: No peripheral edema.  Strong peripheral pulses. Ecchymosis and mild erythema involving the entire tip of the left middle finger. No difficulty extending the finger however restricted to 45 of flexion at the DIP Mental Status: No depression, anxiety, nor agitation. Logical though process. Skin: Warm and dry.  Assessment & Plan: Darlene Ochoa was seen today for finger injury.  Diagnoses and all orders for this visit:  Finger injury, left, initial encounter  Other orders -     meloxicam (MOBIC) 15 MG tablet; Take 1 tablet (15 mg total) by mouth daily.  Encouraged an x-ray however she politely declines. We tried a variety of different fitting finger splints, ultimately a 4 pronged finger splint with Coban was the most comfortable and fit snugly.  25 minutes spent face-to-face during visit today of which at least 50% was counseling or coordinating care regarding: 1. Finger injury, left, initial encounter      No Follow-up on file.

## 2015-09-28 ENCOUNTER — Encounter (HOSPITAL_COMMUNITY): Payer: Self-pay | Admitting: Psychiatry

## 2015-09-28 ENCOUNTER — Ambulatory Visit (INDEPENDENT_AMBULATORY_CARE_PROVIDER_SITE_OTHER): Payer: BLUE CROSS/BLUE SHIELD | Admitting: Psychiatry

## 2015-09-28 ENCOUNTER — Other Ambulatory Visit: Payer: Self-pay | Admitting: Family Medicine

## 2015-09-28 VITALS — BP 128/82 | HR 74 | Ht 65.0 in | Wt 152.0 lb

## 2015-09-28 DIAGNOSIS — F411 Generalized anxiety disorder: Secondary | ICD-10-CM | POA: Diagnosis not present

## 2015-09-28 DIAGNOSIS — F331 Major depressive disorder, recurrent, moderate: Secondary | ICD-10-CM | POA: Diagnosis not present

## 2015-09-28 DIAGNOSIS — F329 Major depressive disorder, single episode, unspecified: Secondary | ICD-10-CM

## 2015-09-28 DIAGNOSIS — R5383 Other fatigue: Secondary | ICD-10-CM

## 2015-09-28 MED ORDER — BUPROPION HCL ER (SR) 200 MG PO TB12: ORAL_TABLET | ORAL | Status: DC

## 2015-09-28 MED ORDER — FLUOXETINE HCL 40 MG PO CAPS: 80.0000 mg | ORAL_CAPSULE | Freq: Every day | ORAL | Status: DC

## 2015-09-28 NOTE — Progress Notes (Signed)
Patient ID: Darlene Ochoa, female   DOB: 12-Sep-1965, 50 y.o.   MRN: [id number] [facility] MD Progress Note  09/28/2015 8:56 AM Darlene Ochoa  MRN:  [id number] Subjective:  Patient is in to follow up on depression and anxiety with fatigue. Has been [facility] patient in past MDD: On Prozac and Wellbutrin. Once Wellbutrin was increased her depression has improved.  Continue to have fatigue altough does not feel depressed. She is on vit d and b12.\ Some overwhelmed feeling because of graduation of her daughter and other things that she has to keep up with at home.  Anxiety; not worsened  fatigue; afternoon mostly. On vitamins   aggravating factor; her dad's death 11 years ago. Her mom is in an assisted living facility  Modifying factors; the husband and having pets She is taking interest in things but still has low energy at times. No psychotic symptoms   severity of depression; 8 out of  10 being no depression   Principal Problem: MDD with fatigue, GAD,  Diagnosis:   Patient Active Problem List   Diagnosis Date Noted  . Vitamin D deficiency [E55.9] 08/01/2014  . B12 deficiency [E53.8] 08/01/2014  . Prolonged Q-T interval on ECG [I45.81] 10/06/2013  . Major depressive disorder, recurrent episode, moderate (HCC) [F33.1] 12/27/2012  . Generalized anxiety disorder [F41.1] 12/27/2012  . Anxiety [F41.9] 04/27/2012  . Essential hypertension [I10] 04/27/2012  . Major depressive disorder (HCC) [F32.9] 03/31/2012  . Chest pain [R07.9] 03/31/2012  . Shortness of breath [R06.02] 03/31/2012  . Migraine [G43.909] 03/31/2012  . PALPITATIONS [R00.2] 08/02/2009   Total Time spent with patient: 30 minutes   Past Medical History:  Past Medical History  Diagnosis Date  . Depression   . Seasonal allergies   . Hypertension   . Major depressive disorder (HCC) 03/31/2012  . Chest pain 03/31/2012  . H/O prolonged Q-T interval on ECG 03/31/2012  . Anxiety     Past Surgical History  Procedure Laterality Date   . Cesarean section  1998   Family History:  Family History  Problem Relation Age of Onset  . Diabetes Mother   . Cancer Mother     Breast  . Depression Mother   . CAD Mother   . Heart failure Father   . Stroke Father   . Alcohol abuse Father   . Alcohol abuse Brother   . Drug abuse Brother    Social History:  History  Alcohol Use No     History  Drug Use No    Social History   Social History  . Marital Status: Married    Spouse Name: N/A  . Number of Children: N/A  . Years of Education: N/A   Social History Main Topics  . Smoking status: Never Smoker   . Smokeless tobacco: Never Used  . Alcohol Use: No  . Drug Use: No  . Sexual Activity:    Partners: Male   Other Topics Concern  . None   Social History Narrative    Musculoskeletal: Strength & Muscle Tone: within normal limits Gait & Station: normal Patient leans: normal   Psychiatric Specialty Exam: Physical Exam  Review of Systems  Constitutional: Positive for malaise/fatigue. Negative for fever.  Cardiovascular: Negative for chest pain.  Skin: Negative for rash.  Neurological: Negative for tremors.  Psychiatric/Behavioral: Negative for depression, suicidal ideas and substance abuse. The patient is not nervous/anxious.   All other systems reviewed and are negative.   Blood pressure 128/82, pulse 74, height  (1.651  m), weight 152 lb (68.947 kg), SpO2 99 %.Body mass index is 25.29 kg/(m^2).  General Appearance: Well Groomed  Patent attorney::  Good  Speech:  Clear and Coherent  Volume:  Normal  Mood:  Euthymic  Affect:  Congruent  Thought Process:  Coherent, Goal Directed, Intact, Linear and Logical  Orientation:  Full (Time, Place, and Person)  Thought Content:  WDL  Suicidal Thoughts:  No  Homicidal Thoughts:  No  Memory:  Immediate;   Good Recent;   Good Remote;   Good  Judgement:  Intact  Insight:  Good  Psychomotor Activity:  Normal  Concentration:  Good  Recall:  Good  Fund of  Knowledge:Good  Language: Good  Akathisia:  No  Handed:  Right  AIMS (if indicated):     Assets:  Communication Skills Desire for Improvement Financial Resources/Insurance Housing Intimacy Leisure Time Physical Health Resilience Social Support Talents/Skills Transportation  ADL's:  Intact  Cognition: WNL  Sleep:        Current Medications: Current Outpatient Prescriptions  Medication Sig Dispense Refill  . buPROPion (WELLBUTRIN SR) 200 MG 12 hr tablet TAKE 1 TABLET (200 MG TOTAL) BY MOUTH DAILY. 30 tablet 1  . FLUoxetine (PROZAC) 40 MG capsule Take 2 capsules (80 mg total) by mouth daily. 60 capsule 1  . hydrochlorothiazide (HYDRODIURIL) 25 MG tablet TAKE 1 TABLET (25 MG TOTAL) BY MOUTH DAILY. 90 tablet 0  . hydrochlorothiazide (HYDRODIURIL) 25 MG tablet TAKE 1 TABLET (25 MG TOTAL) BY MOUTH DAILY. 90 tablet 0  . levofloxacin (LEVAQUIN) 500 MG tablet Take 1 tablet (500 mg total) by mouth daily. 8 tablet 0  . loratadine (CLARITIN) 10 MG tablet Take 10 mg by mouth daily.    . meloxicam (MOBIC) 15 MG tablet Take 1 tablet (15 mg total) by mouth daily. 30 tablet 0  . MINASTRIN 24 FE 1-20 MG-MCG(24) CHEW Chew 1 tablet by mouth Daily.    Marland Kitchen nystatin (MYCOSTATIN) 100000 UNIT/ML suspension Take 5 mLs (500,000 Units total) by mouth 4 (four) times daily. Swish fifteen seconds and swallow. Use for up to one day after symptoms resolve. 240 mL 1  . SUMAtriptan (IMITREX) 100 MG tablet Take 0.5-1 tablets (50-100 mg total) by mouth every 2 (two) hours as needed for migraine. No more than two a day. 10 tablet 11  . vitamin B-12 (CYANOCOBALAMIN) 1000 MCG tablet Take 1 tablet (1,000 mcg total) by mouth daily. 30 tablet 1  . Vitamin D, Ergocalciferol, (DRISDOL) 50000 UNITS CAPS capsule Take 1 capsule (50,000 Units total) by mouth every 7 (seven) days. Recheck Vitamin D in 3 Months 12 capsule 0  . [DISCONTINUED] clonazePAM (KLONOPIN) 0.5 MG tablet TAKE 1 TABLET BY MOUTH EVERY DAY AS NEEDED FOR ANXIETY  30 tablet 0   No current facility-administered medications for this visit.    Lab Results: Physical Findings: AIMS:  CIWA:    COWS:     Treatment Plan Summary: Continue Prozac and Wellbutrin for depression. Planning not to increase since prozac on max dose.  Prescriptions sent.  Continue Prozac for anxiety Fatigue:On vitamins, will work on sleep hygiene and increase physical activity   May consider increase wellbutrin but apperently not depressed so ruling out other causes of fatigue  Call 911 or report local ED for any urgent concerns or suicidal thoughts.  More than 50% time spent in counseling and coordination of care including patient education Prescription refill sent follow-up in 2 or 3 months or earlier if needed     [facility],  Kataleia Quaranta 09/28/2015, 8:56 AM

## 2015-11-07 DIAGNOSIS — F411 Generalized anxiety disorder: Secondary | ICD-10-CM | POA: Diagnosis not present

## 2015-11-22 ENCOUNTER — Ambulatory Visit (HOSPITAL_COMMUNITY): Payer: Self-pay | Admitting: Psychiatry

## 2015-11-28 DIAGNOSIS — F411 Generalized anxiety disorder: Secondary | ICD-10-CM | POA: Diagnosis not present

## 2015-11-28 DIAGNOSIS — F331 Major depressive disorder, recurrent, moderate: Secondary | ICD-10-CM | POA: Diagnosis not present

## 2015-12-03 DIAGNOSIS — F411 Generalized anxiety disorder: Secondary | ICD-10-CM | POA: Diagnosis not present

## 2015-12-18 DIAGNOSIS — F411 Generalized anxiety disorder: Secondary | ICD-10-CM | POA: Diagnosis not present

## 2015-12-29 ENCOUNTER — Other Ambulatory Visit: Payer: Self-pay | Admitting: Family Medicine

## 2015-12-29 ENCOUNTER — Other Ambulatory Visit (HOSPITAL_COMMUNITY): Payer: Self-pay | Admitting: Psychiatry

## 2015-12-31 NOTE — Telephone Encounter (Signed)
Received fax from [facility] requesting a refill for Wellbutrin. Per Dr. Gilmore LarocheAkhtar, refill request is denied. Pt will need an appt. Pt no show appt on 11/22/15. lvm for pt to contact office.

## 2016-01-08 ENCOUNTER — Ambulatory Visit (INDEPENDENT_AMBULATORY_CARE_PROVIDER_SITE_OTHER): Payer: BLUE CROSS/BLUE SHIELD | Admitting: Physician Assistant

## 2016-01-08 ENCOUNTER — Encounter: Payer: Self-pay | Admitting: Physician Assistant

## 2016-01-08 VITALS — BP 119/77 | HR 102 | Ht 65.0 in | Wt 158.0 lb

## 2016-01-08 DIAGNOSIS — J3081 Allergic rhinitis due to animal (cat) (dog) hair and dander: Secondary | ICD-10-CM

## 2016-01-08 MED ORDER — AZITHROMYCIN 250 MG PO TABS: ORAL_TABLET | ORAL | 0 refills | Status: DC

## 2016-01-08 NOTE — Progress Notes (Addendum)
Subjective:     Patient ID: Darlene Ochoa, female   DOB: July 23, 1965, 50 y.o.   MRN: [id number]  HPI Patient is a 50 y.o. Caucasian female presenting today with complaints of congestion, sore throat, and sinus pressure that began yesterday night. Patient reports that she usually gets these symptoms around the same time each year. The patient states that she has been taking over-the-counter Claritin and sudafed with minor symptom relief. Patient notes that she recently was exposed to a cat and has a known allergic response to cats. The patient states that she has always struggled with allergies and had allergy shots as a child. The patient notes that she was seen for anxiety at a psychiatric office today who added Abilify 2mg  to her medication regimen. Patient notes that she has not had the flu shot this year and denies fever, shortness of breath, chest pain, or palpitations.  Review of Systems  Constitutional: Positive for fever. Negative for activity change, appetite change, chills, diaphoresis, fatigue and unexpected weight change.  HENT: Positive for congestion, postnasal drip, rhinorrhea, sinus pressure, sneezing and sore throat. Negative for ear discharge and ear pain.   Eyes: Positive for redness and itching. Negative for pain, discharge and visual disturbance.  Respiratory: Negative for cough, chest tightness, shortness of breath and wheezing.   Cardiovascular: Negative for chest pain, palpitations and leg swelling.  Gastrointestinal: Negative.   Endocrine: Negative.   Genitourinary: Negative.   Musculoskeletal: Negative.   Skin: Negative.   Allergic/Immunologic: Positive for environmental allergies.  Neurological: Positive for weakness. Negative for dizziness, light-headedness, numbness and headaches.      Objective:   Physical Exam  Constitutional: She is oriented to person, place, and time. She appears well-developed and well-nourished. No distress.  HENT:  Head: Normocephalic and  atraumatic.  Right Ear: External ear normal.  Left Ear: External ear normal.  Nose: Nose normal.  Mouth/Throat: No oropharyngeal exudate.  Polyp noted in right nare.  Eyes: Conjunctivae and EOM are normal. Pupils are equal, round, and reactive to light. Right eye exhibits no discharge. Left eye exhibits no discharge. No scleral icterus.  Neck: Normal range of motion. Neck supple. No JVD present. No tracheal deviation present. No thyromegaly present.  Cardiovascular: Normal rate, regular rhythm and intact distal pulses.  Exam reveals no gallop and no friction rub.   No murmur heard. Pulmonary/Chest: Effort normal and breath sounds normal. No stridor. No respiratory distress. She has no wheezes. She has no rales. She exhibits no tenderness.  Lymphadenopathy:    She has no cervical adenopathy.  Neurological: She is alert and oriented to person, place, and time. No cranial nerve deficit. Coordination normal.  Skin: Skin is warm and dry. No rash noted. She is not diaphoretic. No erythema. No pallor.  Psychiatric: She has a normal mood and affect. Her behavior is normal. Judgment and thought content normal.      Assessment:     Diagnoses and all orders for this visit:  Allergic rhinitis due to animal hair and dander  Other orders -     azithromycin (ZITHROMAX) 250 MG tablet; Take 2 tablets now and then one tablet for 4 days.      Plan:     1. Allergic rhinitis - Discussed with patient that I suspect her symptoms are secondary to allergies most likely from her feline exposure. Patient received Solumedrol 125mg  IM injection in-clinic today. Patient instructed to continue with symptomatic allergy relief with Flonase 2 sprays in each nostril and antihistamines as  needed. Patient is aware that she should not begin azithromycin unless symptoms persist or worsen in the next 5 days. Patient to return-to-clinic if symptoms do not resolve or worsen.

## 2016-01-08 NOTE — Patient Instructions (Signed)
Consider flonase 2 sprays each nostril.

## 2016-01-09 MED ORDER — METHYLPREDNISOLONE SODIUM SUCC 125 MG IJ SOLR
125.0000 mg | Freq: Once | INTRAMUSCULAR | Status: AC
  Administered 2016-01-08: 125 mg via INTRAMUSCULAR

## 2016-01-09 NOTE — Addendum Note (Signed)
Addended by: Donne AnonBENDER, Aniesa Boback L on: 01/09/2016 08:07 AM   Modules accepted: Orders

## 2016-01-16 ENCOUNTER — Ambulatory Visit (INDEPENDENT_AMBULATORY_CARE_PROVIDER_SITE_OTHER): Payer: BLUE CROSS/BLUE SHIELD | Admitting: Family Medicine

## 2016-01-16 ENCOUNTER — Encounter: Payer: Self-pay | Admitting: Family Medicine

## 2016-01-16 VITALS — BP 153/87 | HR 81 | Wt 158.0 lb

## 2016-01-16 DIAGNOSIS — R220 Localized swelling, mass and lump, head: Secondary | ICD-10-CM

## 2016-01-16 DIAGNOSIS — J029 Acute pharyngitis, unspecified: Secondary | ICD-10-CM | POA: Diagnosis not present

## 2016-01-16 LAB — POCT RAPID STREP A (OFFICE): Rapid Strep A Screen: NEGATIVE

## 2016-01-16 MED ORDER — PREDNISONE 10 MG PO TABS: 30.0000 mg | ORAL_TABLET | Freq: Every day | ORAL | 0 refills | Status: DC

## 2016-01-16 NOTE — Patient Instructions (Signed)
Thank you for coming in today. STOP azithromycin.  Start prednisone daily.  Return if not better.  Call or go to the emergency room if you get worse, have trouble breathing, have chest pains, or palpitations.

## 2016-01-16 NOTE — Progress Notes (Signed)
Darlene Ochoa is a 50 y.o. female who presents to [facility]: Primary Care Sports Medicine today for tongue swelling. Patient was seen on the 19th for allergic rhinitis versus sinusitis. She was given a steroid injection which helped significantly but does not last very long. Additionally she was prescribed azithromycin for use if not better. She started taking azithromycin 4 days ago. This morning she notes left-sided tongue swelling with mild irritation and pain. She denies any trouble breathing fevers chills nausea vomiting or diarrhea. She has continued nasal discharge and congestion.   Past Medical History:  Diagnosis Date  . Anxiety   . Chest pain 03/31/2012  . Depression   . H/O prolonged Q-T interval on ECG 03/31/2012  . Hypertension   . Major depressive disorder (HCC) 03/31/2012  . Seasonal allergies    Past Surgical History:  Procedure Laterality Date  . CESAREAN SECTION  1998   Social History  Substance Use Topics  . Smoking status: Never Smoker  . Smokeless tobacco: Never Used  . Alcohol use No   family history includes Alcohol abuse in her brother and father; CAD in her mother; Cancer in her mother; Depression in her mother; Diabetes in her mother; Drug abuse in her brother; Heart failure in her father; Stroke in her father.  ROS as above:  Medications: Current Outpatient Prescriptions  Medication Sig Dispense Refill  . ARIPiprazole (ABILIFY) 2 MG tablet Take by mouth.    Marland Kitchen buPROPion (WELLBUTRIN SR) 200 MG 12 hr tablet TAKE 1 TABLET (200 MG TOTAL) BY MOUTH DAILY. 30 tablet 1  . desogestrel-ethinyl estradiol (VIORELE) 0.15-0.02/0.01 MG (21/5) tablet Take by mouth.    Marland Kitchen FLUoxetine (PROZAC) 40 MG capsule Take 2 capsules (80 mg total) by mouth daily. 60 capsule 1  . hydrochlorothiazide (HYDRODIURIL) 25 MG tablet TAKE 1 TABLET (25 MG TOTAL) BY MOUTH DAILY. 90 tablet 0  . loratadine  (CLARITIN) 10 MG tablet Take 10 mg by mouth daily.    . SUMAtriptan (IMITREX) 100 MG tablet Take 0.5-1 tablets (50-100 mg total) by mouth every 2 (two) hours as needed for migraine. No more than two a day. 10 tablet 11  . vitamin B-12 (CYANOCOBALAMIN) 1000 MCG tablet Take 1 tablet (1,000 mcg total) by mouth daily. 30 tablet 1  . predniSONE (DELTASONE) 10 MG tablet Take 3 tablets (30 mg total) by mouth daily with breakfast. 15 tablet 0   No current facility-administered medications for this visit.    Allergies  Allergen Reactions  . Lac Bovis Nausea And Vomiting  . Peanut Oil Other (See Comments)  . Pork Allergy Nausea And Vomiting  . Codeine   . Doxycycline Nausea And Vomiting  . Lactose Intolerance (Gi)   . Peanut-Containing Drug Products   . Penicillins   . Sulfonamide Derivatives   . Trazodone And Nefazodone     Prolonged QTC     Exam:  BP (!) 153/87   Pulse 81   Wt 158 lb (71.7 kg)   BMI 26.29 kg/m  Gen: Well NAD HEENT: EOMI,  MMM Was side of tongue is minimally swollen. Normal posterior pharynx otherwise. No cervical lymphadenopathy present. Lungs: Normal work of breathing. CTABL Heart: RRR no MRG Abd: NABS, Soft. Nondistended, Nontender Exts: Brisk capillary refill, warm and well perfused.   No skin or erythema or urticaria present  Results for orders placed or performed in visit on 01/16/16 (from the past 24 hour(s))  POCT rapid strep A  Status: Normal   Collection Time: 01/16/16  2:53 PM  Result Value Ref Range   Rapid Strep A Screen Negative Negative   No results found.    Assessment and Plan: 50 y.o. female with Tongue swelling. Unclear etiology. Recommend stopping azithromycin. She was prednisone. Return if not better. Discussed precautions.   Orders Placed This Encounter  Procedures  . POCT rapid strep A    Discussed warning signs or symptoms. Please see discharge instructions. Patient expresses understanding.

## 2016-01-17 DIAGNOSIS — F411 Generalized anxiety disorder: Secondary | ICD-10-CM | POA: Diagnosis not present

## 2016-02-06 DIAGNOSIS — F411 Generalized anxiety disorder: Secondary | ICD-10-CM | POA: Diagnosis not present

## 2016-02-06 DIAGNOSIS — Z79899 Other long term (current) drug therapy: Secondary | ICD-10-CM | POA: Diagnosis not present

## 2016-02-06 DIAGNOSIS — F331 Major depressive disorder, recurrent, moderate: Secondary | ICD-10-CM | POA: Diagnosis not present

## 2016-02-15 DIAGNOSIS — F411 Generalized anxiety disorder: Secondary | ICD-10-CM | POA: Diagnosis not present

## 2016-03-18 DIAGNOSIS — F411 Generalized anxiety disorder: Secondary | ICD-10-CM | POA: Diagnosis not present

## 2016-03-18 DIAGNOSIS — F331 Major depressive disorder, recurrent, moderate: Secondary | ICD-10-CM | POA: Diagnosis not present

## 2016-03-26 ENCOUNTER — Other Ambulatory Visit: Payer: Self-pay | Admitting: Physician Assistant

## 2016-03-31 DIAGNOSIS — F411 Generalized anxiety disorder: Secondary | ICD-10-CM | POA: Diagnosis not present

## 2016-04-09 DIAGNOSIS — F411 Generalized anxiety disorder: Secondary | ICD-10-CM | POA: Diagnosis not present

## 2016-04-26 ENCOUNTER — Other Ambulatory Visit: Payer: Self-pay | Admitting: Physician Assistant

## 2016-04-28 DIAGNOSIS — F411 Generalized anxiety disorder: Secondary | ICD-10-CM | POA: Diagnosis not present

## 2016-05-12 DIAGNOSIS — F331 Major depressive disorder, recurrent, moderate: Secondary | ICD-10-CM | POA: Diagnosis not present

## 2016-05-12 DIAGNOSIS — F411 Generalized anxiety disorder: Secondary | ICD-10-CM | POA: Diagnosis not present

## 2016-06-04 DIAGNOSIS — F411 Generalized anxiety disorder: Secondary | ICD-10-CM | POA: Diagnosis not present

## 2016-06-04 DIAGNOSIS — F331 Major depressive disorder, recurrent, moderate: Secondary | ICD-10-CM | POA: Diagnosis not present

## 2016-06-09 DIAGNOSIS — F411 Generalized anxiety disorder: Secondary | ICD-10-CM | POA: Diagnosis not present

## 2016-06-09 DIAGNOSIS — F331 Major depressive disorder, recurrent, moderate: Secondary | ICD-10-CM | POA: Diagnosis not present

## 2016-07-07 DIAGNOSIS — F411 Generalized anxiety disorder: Secondary | ICD-10-CM | POA: Diagnosis not present

## 2016-07-09 DIAGNOSIS — F411 Generalized anxiety disorder: Secondary | ICD-10-CM | POA: Diagnosis not present

## 2016-07-09 DIAGNOSIS — F331 Major depressive disorder, recurrent, moderate: Secondary | ICD-10-CM | POA: Diagnosis not present

## 2016-07-09 DIAGNOSIS — Z79899 Other long term (current) drug therapy: Secondary | ICD-10-CM | POA: Diagnosis not present

## 2016-07-29 ENCOUNTER — Other Ambulatory Visit: Payer: Self-pay | Admitting: Physician Assistant

## 2016-07-30 DIAGNOSIS — F411 Generalized anxiety disorder: Secondary | ICD-10-CM | POA: Diagnosis not present

## 2016-08-06 DIAGNOSIS — F331 Major depressive disorder, recurrent, moderate: Secondary | ICD-10-CM | POA: Diagnosis not present

## 2016-08-06 DIAGNOSIS — Z79899 Other long term (current) drug therapy: Secondary | ICD-10-CM | POA: Diagnosis not present

## 2016-08-06 DIAGNOSIS — F411 Generalized anxiety disorder: Secondary | ICD-10-CM | POA: Diagnosis not present

## 2016-08-07 DIAGNOSIS — Z01419 Encounter for gynecological examination (general) (routine) without abnormal findings: Secondary | ICD-10-CM | POA: Diagnosis not present

## 2016-08-07 DIAGNOSIS — Z6828 Body mass index (BMI) 28.0-28.9, adult: Secondary | ICD-10-CM | POA: Diagnosis not present

## 2016-08-21 ENCOUNTER — Ambulatory Visit (INDEPENDENT_AMBULATORY_CARE_PROVIDER_SITE_OTHER): Payer: BLUE CROSS/BLUE SHIELD | Admitting: Physician Assistant

## 2016-08-21 ENCOUNTER — Ambulatory Visit (INDEPENDENT_AMBULATORY_CARE_PROVIDER_SITE_OTHER): Payer: BLUE CROSS/BLUE SHIELD

## 2016-08-21 VITALS — BP 124/84 | HR 82 | Wt 170.0 lb

## 2016-08-21 DIAGNOSIS — M62838 Other muscle spasm: Secondary | ICD-10-CM

## 2016-08-21 DIAGNOSIS — M4602 Spinal enthesopathy, cervical region: Secondary | ICD-10-CM

## 2016-08-21 DIAGNOSIS — M25512 Pain in left shoulder: Secondary | ICD-10-CM | POA: Diagnosis not present

## 2016-08-21 DIAGNOSIS — M542 Cervicalgia: Secondary | ICD-10-CM | POA: Diagnosis not present

## 2016-08-21 MED ORDER — MELOXICAM 15 MG PO TABS: ORAL_TABLET | ORAL | 0 refills | Status: DC

## 2016-08-21 MED ORDER — CYCLOBENZAPRINE HCL 5 MG PO TABS: 5.0000 mg | ORAL_TABLET | Freq: Three times a day (TID) | ORAL | 1 refills | Status: DC | PRN

## 2016-08-21 NOTE — Progress Notes (Signed)
HPI:                                                                Darlene Ochoa is a 51 y.o. female who [facility] Primary Care Sports Medicine today for left arm pain  Arm Pain   The incident occurred more than 1 week ago (x 3 weeks). There was no injury mechanism. The pain is present in the left shoulder and left forearm. The quality of the pain is described as aching ("feels like I did a bad exercise"). The pain has been constant since the incident. Associated symptoms include numbness and tingling. Nothing aggravates the symptoms. She has tried NSAIDs for the symptoms. The treatment provided no relief.    Past Medical History:  Diagnosis Date  . Anxiety   . Chest pain 03/31/2012  . Depression   . H/O prolonged Q-T interval on ECG 03/31/2012  . Hypertension   . Major depressive disorder 03/31/2012  . Seasonal allergies    Past Surgical History:  Procedure Laterality Date  . CESAREAN SECTION  1998   Social History  Substance Use Topics  . Smoking status: Never Smoker  . Smokeless tobacco: Never Used  . Alcohol use No   family history includes Alcohol abuse in her brother and father; CAD in her mother; Cancer in her mother; Depression in her mother; Diabetes in her mother; Drug abuse in her brother; Heart failure in her father; Stroke in her father.  ROS: negative except as noted in the HPI  Medications: Current Outpatient Prescriptions  Medication Sig Dispense Refill  . ARIPiprazole (ABILIFY) 2 MG tablet Take by mouth.    Marland Kitchen. buPROPion (WELLBUTRIN SR) 200 MG 12 hr tablet TAKE 1 TABLET (200 MG TOTAL) BY MOUTH DAILY. 30 tablet 1  . desogestrel-ethinyl estradiol (VIORELE) 0.15-0.02/0.01 MG (21/5) tablet Take by mouth.    Marland Kitchen. FLUoxetine (PROZAC) 40 MG capsule Take 2 capsules (80 mg total) by mouth daily. 60 capsule 1  . hydrochlorothiazide (HYDRODIURIL) 25 MG tablet Take 1 tablet (25 mg total) by mouth daily. Needs appointment for future refills. 30  tablet 0  . loratadine (CLARITIN) 10 MG tablet Take 10 mg by mouth daily.    . SUMAtriptan (IMITREX) 100 MG tablet Take 0.5-1 tablets (50-100 mg total) by mouth every 2 (two) hours as needed for migraine. No more than two a day. 10 tablet 11  . vitamin B-12 (CYANOCOBALAMIN) 1000 MCG tablet Take 1 tablet (1,000 mcg total) by mouth daily. 30 tablet 1  . cyclobenzaprine (FLEXERIL) 5 MG tablet Take 1 tablet (5 mg total) by mouth 3 (three) times daily as needed for muscle spasms. 30 tablet 1  . meloxicam (MOBIC) 15 MG tablet Take 1 tablet by mouth daily for 2 weeks, then 1 tablet as needed for pain 30 tablet 0   No current facility-administered medications for this visit.    Allergies  Allergen Reactions  . Lac Bovis Nausea And Vomiting  . Peanut Oil Other (See Comments)  . Pork Allergy Nausea And Vomiting  . Codeine   . Doxycycline Nausea And Vomiting  . Lactose Intolerance (Gi)   . Peanut-Containing Drug Products   . Penicillins   . Sulfonamide Derivatives   . Trazodone And Nefazodone  Prolonged QTC       Objective:  BP 124/84   Pulse 82   Wt 170 lb (77.1 kg)   BMI 28.29 kg/m  Gen: well-groomed, cooperative, not ill-appearing, no distress Pulm: Normal work of breathing, normal phonation Neuro: alert and oriented x 3, sensation intact, no tremor MSK: left shoulder: tenderness over a/c joint, limited flexion and external rotation, positive Hawkin's sign, positive Neer's sign; back: no spinous process tenderness or step-off deformity, there is tenderness and palpable spasm of the left trapezius muscle, full active ROM of the spine; extremities atraumatic, normal gait and station Skin: warm, dry, intact; no rashes or lesions on exposed skin   No results found for this or any previous visit (from the past 72 hour(s)). No results found.  Assessment and Plan: 51 y.o. female with   1. Trapezius muscle spasm - DG Cervical Spine Complete - Ambulatory referral to Physical  Therapy - cyclobenzaprine (FLEXERIL) 5 MG tablet; Take 1 tablet (5 mg total) by mouth 3 (three) times daily as needed for muscle spasms.  Dispense: 30 tablet; Refill: 1  2. Acute pain of left shoulder - patient has symptoms consistent with impingement syndrome. Referring to physical therapy, starting daily antiinflammatory, and recommend follow-up with sports medicine in 4 weeks - DG Shoulder Left; Future - Ambulatory referral to Physical Therapy - meloxicam (MOBIC) 15 MG tablet; Take 1 tablet by mouth daily for 2 weeks, then 1 tablet as needed for pain  Dispense: 30 tablet; Refill: 0   Patient education and anticipatory guidance given Patient agrees with treatment plan Follow-up with Sports Medicine in 4 weeks or sooner as needed if symptoms worsen or fail to improve  Levonne Hubert PA-C

## 2016-08-21 NOTE — Patient Instructions (Addendum)
- Take Meloxicam 1 tablet daily with food for the next two weeks (do not combine with Advil or any other over-the-counter pain relievers. Tylenol is okay) - Take Flexeril 1 tablet as needed for your muscle spasm in your upper back/trapezius. Will cause drowsiness, so do not drive while taking this medication. - Follow-up with Physical Therapy for rehab of your shoulder and trapezius/lower neck - Follow-up with Sports Medicine in 4 weeks    Shoulder Impingement Syndrome Shoulder impingement syndrome is a condition that causes pain when connective tissues (tendons) surrounding the shoulder joint become pinched. These tendons are part of the group of muscles and tissues that help to stabilize the shoulder (rotator cuff). Beneath the rotator cuff is a fluid-filled sac (bursa) that allows the muscles and tendons to glide smoothly. The bursa may become swollen or irritated (bursitis). Bursitis, swelling in the rotator cuff tendons, or both conditions can decrease how much space is under a bone in the shoulder joint (acromion), resulting in impingement. What are the causes? Shoulder impingement syndrome can be caused by bursitis or swelling of the rotator cuff tendons, which may result from:  Repetitive overhead arm movements.  Falling onto the shoulder.  Weakness in the shoulder muscles. What increases the risk? You may be more likely to develop this condition if you are an athlete who participates in:  Sports that involve throwing, such as baseball.  Tennis.  Swimming.  Volleyball. Some people are also more likely to develop impingement syndrome because of the shape of their acromion bone. What are the signs or symptoms? The main symptom of this condition is pain on the front or side of the shoulder. Pain may:  Get worse when lifting or raising the arm.  Get worse at night.  Wake you up from sleeping.  Feel sharp when the shoulder is moved, and then fade to an ache. Other signs and  symptoms may include:  Tenderness.  Stiffness.  Inability to raise the arm above shoulder level or behind the body.  Weakness. How is this diagnosed? This condition may be diagnosed based on:  Your symptoms.  Your medical history.  A physical exam.  Imaging tests, such as:  X-rays.  MRI.  Ultrasound. How is this treated? Treatment for this condition may include:  Resting your shoulder and avoiding all activities that cause pain or put stress on the shoulder.  Icing your shoulder.  NSAIDs to help reduce pain and swelling.  One or more injections of medicines to numb the area and reduce inflammation.  Physical therapy.  Surgery. This may be needed if nonsurgical treatments have not helped. Surgery may involve repairing the rotator cuff, reshaping the acromion, or removing the bursa. Follow these instructions at home: Managing pain, stiffness, and swelling   If directed, apply ice to the injured area.  Put ice in a plastic bag.  Place a towel between your skin and the bag.  Leave the ice on for 20 minutes, 2-3 times a day. Activity   Rest and return to your normal activities as told by your health care provider. Ask your health care provider what activities are safe for you.  Do exercises as told by your health care provider. General instructions   Do not use any tobacco products, including cigarettes, chewing tobacco, or e-cigarettes. Tobacco can delay healing. If you need help quitting, ask your health care provider.  Ask your health care provider when it is safe for you to drive.  Take over-the-counter and prescription medicines only  as told by your health care provider.  Keep all follow-up visits as told by your health care provider. This is important. How is this prevented?  Give your body time to rest between periods of activity.  Be safe and responsible while being active to avoid falls.  Maintain physical fitness, including strength and  flexibility. Contact a health care provider if:  Your symptoms have not improved after 1-2 months of treatment and rest.  You cannot lift your arm away from your body. This information is not intended to replace advice given to you by your health care provider. Make sure you discuss any questions you have with your health care provider. Document Released: 04/07/2005 Document Revised: 12/13/2015 Document Reviewed: 03/10/2015 Elsevier Interactive Patient Education  2017 [employer].

## 2016-08-25 ENCOUNTER — Other Ambulatory Visit: Payer: Self-pay | Admitting: Physician Assistant

## 2016-08-25 DIAGNOSIS — F411 Generalized anxiety disorder: Secondary | ICD-10-CM | POA: Diagnosis not present

## 2016-09-17 DIAGNOSIS — F331 Major depressive disorder, recurrent, moderate: Secondary | ICD-10-CM | POA: Diagnosis not present

## 2016-09-17 DIAGNOSIS — F411 Generalized anxiety disorder: Secondary | ICD-10-CM | POA: Diagnosis not present

## 2016-09-17 DIAGNOSIS — Z79899 Other long term (current) drug therapy: Secondary | ICD-10-CM | POA: Diagnosis not present

## 2016-09-18 ENCOUNTER — Encounter: Payer: Self-pay | Admitting: Family Medicine

## 2016-09-22 DIAGNOSIS — F411 Generalized anxiety disorder: Secondary | ICD-10-CM | POA: Diagnosis not present

## 2016-10-16 DIAGNOSIS — Z79899 Other long term (current) drug therapy: Secondary | ICD-10-CM | POA: Diagnosis not present

## 2016-10-16 DIAGNOSIS — F331 Major depressive disorder, recurrent, moderate: Secondary | ICD-10-CM | POA: Diagnosis not present

## 2016-10-16 DIAGNOSIS — F411 Generalized anxiety disorder: Secondary | ICD-10-CM | POA: Diagnosis not present

## 2016-11-14 DIAGNOSIS — F331 Major depressive disorder, recurrent, moderate: Secondary | ICD-10-CM | POA: Diagnosis not present

## 2016-11-14 DIAGNOSIS — F411 Generalized anxiety disorder: Secondary | ICD-10-CM | POA: Diagnosis not present

## 2016-11-17 DIAGNOSIS — F411 Generalized anxiety disorder: Secondary | ICD-10-CM | POA: Diagnosis not present

## 2016-11-17 DIAGNOSIS — F331 Major depressive disorder, recurrent, moderate: Secondary | ICD-10-CM | POA: Diagnosis not present

## 2016-12-04 ENCOUNTER — Encounter: Payer: Self-pay | Admitting: Physician Assistant

## 2016-12-04 ENCOUNTER — Ambulatory Visit (INDEPENDENT_AMBULATORY_CARE_PROVIDER_SITE_OTHER): Payer: BLUE CROSS/BLUE SHIELD | Admitting: Physician Assistant

## 2016-12-04 VITALS — BP 137/90 | HR 73 | Temp 98.5°F | Ht 65.0 in | Wt 172.0 lb

## 2016-12-04 DIAGNOSIS — G43009 Migraine without aura, not intractable, without status migrainosus: Secondary | ICD-10-CM

## 2016-12-04 DIAGNOSIS — R221 Localized swelling, mass and lump, neck: Secondary | ICD-10-CM

## 2016-12-04 DIAGNOSIS — I10 Essential (primary) hypertension: Secondary | ICD-10-CM

## 2016-12-04 MED ORDER — HYDROCHLOROTHIAZIDE 25 MG PO TABS: 25.0000 mg | ORAL_TABLET | Freq: Every day | ORAL | 0 refills | Status: DC

## 2016-12-04 MED ORDER — PREDNISONE 50 MG PO TABS: ORAL_TABLET | ORAL | 0 refills | Status: DC

## 2016-12-04 MED ORDER — CEFUROXIME AXETIL 250 MG PO TABS: 250.0000 mg | ORAL_TABLET | Freq: Two times a day (BID) | ORAL | 0 refills | Status: AC

## 2016-12-04 MED ORDER — SUMATRIPTAN SUCCINATE 50 MG PO TABS: 50.0000 mg | ORAL_TABLET | ORAL | 0 refills | Status: DC | PRN

## 2016-12-04 NOTE — Progress Notes (Signed)
HPI:                                                                Darlene Ochoa is a 51 y.o. female who [facility] [place]: Primary Care Sports Medicine today for neck mass  Patient reports anterior neck mass x 5 days. States it is uncomfortable to swallow, but otherwise not tender or warm. Denies facial/lip/tongue swelling. Denies constitutional symptoms. Denies lymphadenopathy elsewhere. Denies respiratory illness. States she has multiple allergies, but no history of angioedema or anaphylaxis. Son has a cat, but she has not been scratched. Denies dental pain or sore throat.  Patient is also requesting refills of her hypertension and migraine medication  HTN: taking Hydrochlorothiazide daily. Compliant with medications. Denies vision change, headache, chest pain with exertion, orthopnea, lightheadedness, syncope and edema. Risk factors include:   Past Medical History:  Diagnosis Date  . Anxiety   . Chest pain 03/31/2012  . Depression   . H/O prolonged Q-T interval on ECG 03/31/2012  . Hypertension   . Major depressive disorder 03/31/2012  . Seasonal allergies    Past Surgical History:  Procedure Laterality Date  . CESAREAN SECTION  1998   Social History  Substance Use Topics  . Smoking status: Never Smoker  . Smokeless tobacco: Never Used  . Alcohol use No   family history includes Alcohol abuse in her brother and father; CAD in her mother; Cancer in her mother; Depression in her mother; Diabetes in her mother; Drug abuse in her brother; Heart failure in her father; Stroke in her father.  ROS: negative except as noted in the HPI  Medications: Current Outpatient Prescriptions  Medication Sig Dispense Refill  . ARIPiprazole (ABILIFY) 2 MG tablet Take by mouth.    Marland Kitchen. buPROPion (WELLBUTRIN SR) 200 MG 12 hr tablet TAKE 1 TABLET (200 MG TOTAL) BY MOUTH DAILY. 30 tablet 1  . cyclobenzaprine (FLEXERIL) 5 MG tablet Take 1 tablet (5 mg total) by mouth 3 (three)  times daily as needed for muscle spasms. 30 tablet 1  . desogestrel-ethinyl estradiol (VIORELE) 0.15-0.02/0.01 MG (21/5) tablet Take by mouth.    Marland Kitchen. FLUoxetine (PROZAC) 40 MG capsule Take 2 capsules (80 mg total) by mouth daily. 60 capsule 1  . hydrochlorothiazide (HYDRODIURIL) 25 MG tablet Take 1 tablet (25 mg total) by mouth daily. MUST make appointment for future refills. 15 tablet 0  . loratadine (CLARITIN) 10 MG tablet Take 10 mg by mouth daily.    . meloxicam (MOBIC) 15 MG tablet Take 1 tablet by mouth daily for 2 weeks, then 1 tablet as needed for pain 30 tablet 0  . SUMAtriptan (IMITREX) 100 MG tablet Take 0.5-1 tablets (50-100 mg total) by mouth every 2 (two) hours as needed for migraine. No more than two a day. 10 tablet 11  . vitamin B-12 (CYANOCOBALAMIN) 1000 MCG tablet Take 1 tablet (1,000 mcg total) by mouth daily. 30 tablet 1   No current facility-administered medications for this visit.    Allergies  Allergen Reactions  . Lac Bovis Nausea And Vomiting  . Peanut Oil Other (See Comments)  . Pork Allergy Nausea And Vomiting  . Codeine   . Doxycycline Nausea And Vomiting  . Lactose Intolerance (Gi)   . Peanut-Containing Drug Products   .  Penicillins   . Sulfonamide Derivatives   . Trazodone And Nefazodone     Prolonged QTC       Objective:  BP 137/90   Pulse 73   Temp 98.5 F (36.9 C) (Oral)   Ht 5\' 5"  (1.651 m)   Wt 172 lb (78 kg)   BMI 28.62 kg/m  Gen:  alert, not ill-appearing, no distress, appropriate for age HEENT: head normocephalic without obvious abnormality, conjunctiva and cornea clear, oropharynx clear, uvula midline, no tonsillar exudates, trachea midline, anterior neck swollen from the submental area to the hyoid above the thyroid cartilage. There is right tonsillar adenopathy. No thyromegaly or tenderness Pulm: Normal work of breathing, normal phonation Neuro: alert and oriented x 3, no tremor MSK: extremities atraumatic, normal gait and  station Skin: intact, no rashes on exposed skin, no jaundice, no cyanosis  No results found for this or any previous visit (from the past 72 hour(s)). No results found.    Assessment and Plan: 51 y.o. female with   1. Essential hypertension BP Readings from Last 3 Encounters:  12/04/16 137/90  08/21/16 124/84  01/16/16 (!) 153/87  - hydrochlorothiazide (HYDRODIURIL) 25 MG tablet; Take 1 tablet (25 mg total) by mouth daily. MUST make appointment for future refills.  Dispense: 15 tablet; Refill: 0 - Comprehensive metabolic panel - follow-up with PCP in 1 week   2. Migraine without aura and without status migrainosus, not intractable - SUMAtriptan (IMITREX) 50 MG tablet; Take 1 tablet (50 mg total) by mouth every 2 (two) hours as needed for migraine. May repeat in 2 hours if headache persists or recurs.  Dispense: 12 tablet; Refill: 0  3. 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  Dispense: 5 tablet; Refill: 0 - cefUROXime (CEFTIN) 250 MG tablet; Take 1 tablet (250 mg total) by mouth 2 (two) times daily with a meal.  Dispense: 14 tablet; Refill: 0  Patient education and anticipatory guidance given Patient agrees with treatment plan Follow-up in 1 week or sooner as needed   Levonne Hubert PA-C

## 2016-12-04 NOTE — Patient Instructions (Addendum)
-   Plan to get labs drawn today - You will be contacted to schedule the ultrasound  - Schedule [facility] appointment with [facility] for your blood pressure and med refills. You will need to be seen every 6 months for this.

## 2016-12-05 LAB — CBC WITH DIFFERENTIAL/PLATELET
BASOS PCT: 0 %
Basophils Absolute: 0 cells/uL (ref 0–200)
EOS ABS: 335 {cells}/uL (ref 15–500)
Eosinophils Relative: 5 %
HCT: 38.9 % (ref 35.0–45.0)
Hemoglobin: 12.7 g/dL (ref 11.7–15.5)
LYMPHS PCT: 36 %
Lymphs Abs: 2412 cells/uL (ref 850–3900)
MCH: 30 pg (ref 27.0–33.0)
MCHC: 32.6 g/dL (ref 32.0–36.0)
MCV: 91.7 fL (ref 80.0–100.0)
MONOS PCT: 6 %
MPV: 9.9 fL (ref 7.5–12.5)
Monocytes Absolute: 402 cells/uL (ref 200–950)
Neutro Abs: 3551 cells/uL (ref 1500–7800)
Neutrophils Relative %: 53 %
Platelets: 310 10*3/uL (ref 140–400)
RBC: 4.24 MIL/uL (ref 3.80–5.10)
RDW: 12.9 % (ref 11.0–15.0)
WBC: 6.7 10*3/uL (ref 3.8–10.8)

## 2016-12-05 LAB — COMPREHENSIVE METABOLIC PANEL
ALBUMIN: 4.1 g/dL (ref 3.6–5.1)
ALT: 12 U/L (ref 6–29)
AST: 12 U/L (ref 10–35)
Alkaline Phosphatase: 83 U/L (ref 33–130)
BILIRUBIN TOTAL: 0.3 mg/dL (ref 0.2–1.2)
BUN: 12 mg/dL (ref 7–25)
CO2: 25 mmol/L (ref 20–32)
CREATININE: 0.82 mg/dL (ref 0.50–1.05)
Calcium: 9.5 mg/dL (ref 8.6–10.4)
Chloride: 102 mmol/L (ref 98–110)
Glucose, Bld: 89 mg/dL (ref 65–99)
Potassium: 4 mmol/L (ref 3.5–5.3)
SODIUM: 138 mmol/L (ref 135–146)
TOTAL PROTEIN: 6.7 g/dL (ref 6.1–8.1)

## 2016-12-05 LAB — HIV ANTIBODY (ROUTINE TESTING W REFLEX): HIV: NONREACTIVE

## 2016-12-05 NOTE — Progress Notes (Signed)
Labs are normal No evidence of infection Proceed with neck ultrasound

## 2016-12-07 DIAGNOSIS — R221 Localized swelling, mass and lump, neck: Secondary | ICD-10-CM | POA: Insufficient documentation

## 2016-12-08 DIAGNOSIS — F331 Major depressive disorder, recurrent, moderate: Secondary | ICD-10-CM | POA: Diagnosis not present

## 2016-12-08 DIAGNOSIS — F411 Generalized anxiety disorder: Secondary | ICD-10-CM | POA: Diagnosis not present

## 2016-12-09 ENCOUNTER — Other Ambulatory Visit: Payer: Self-pay

## 2016-12-12 DIAGNOSIS — Z79899 Other long term (current) drug therapy: Secondary | ICD-10-CM | POA: Diagnosis not present

## 2016-12-12 DIAGNOSIS — F411 Generalized anxiety disorder: Secondary | ICD-10-CM | POA: Diagnosis not present

## 2016-12-12 DIAGNOSIS — F331 Major depressive disorder, recurrent, moderate: Secondary | ICD-10-CM | POA: Diagnosis not present

## 2016-12-15 ENCOUNTER — Ambulatory Visit (INDEPENDENT_AMBULATORY_CARE_PROVIDER_SITE_OTHER): Payer: BLUE CROSS/BLUE SHIELD | Admitting: Physician Assistant

## 2016-12-15 ENCOUNTER — Encounter: Payer: Self-pay | Admitting: Physician Assistant

## 2016-12-15 VITALS — BP 118/74 | HR 83 | Ht 65.0 in | Wt 172.0 lb

## 2016-12-15 DIAGNOSIS — R221 Localized swelling, mass and lump, neck: Secondary | ICD-10-CM | POA: Diagnosis not present

## 2016-12-15 DIAGNOSIS — I10 Essential (primary) hypertension: Secondary | ICD-10-CM

## 2016-12-15 DIAGNOSIS — Z1231 Encounter for screening mammogram for malignant neoplasm of breast: Secondary | ICD-10-CM | POA: Diagnosis not present

## 2016-12-15 MED ORDER — HYDROCHLOROTHIAZIDE 25 MG PO TABS: 25.0000 mg | ORAL_TABLET | Freq: Every day | ORAL | 4 refills | Status: DC

## 2016-12-15 NOTE — Progress Notes (Signed)
   Subjective:    Patient ID: Darlene Ochoa, female    DOB: 07/17/1965, 51 y.o.   MRN: [id number]  HPI  Pt is a 51 yo female who presents to the clinic for follow up on BP. She started back HCTZ and doing great. No CP, palpitations, headaches, vision changes.   Submental mass resolved before she went for u/s. No problems or concerns today.  .   .. Active Ambulatory Problems    Diagnosis Date Noted  . PALPITATIONS 08/02/2009  . Major depressive disorder 03/31/2012  . Chest pain 03/31/2012  . Shortness of breath 03/31/2012  . Migraine 03/31/2012  . Anxiety 04/27/2012  . Essential hypertension 04/27/2012  . Major depressive disorder, recurrent episode, moderate (HCC) 12/27/2012  . Generalized anxiety disorder 12/27/2012  . Prolonged Q-T interval on ECG 10/06/2013  . Vitamin D deficiency 08/01/2014  . B12 deficiency 08/01/2014  . Trapezius muscle spasm 08/21/2016  . Acute pain of left shoulder 08/21/2016  . Submental mass 12/07/2016   Resolved Ambulatory Problems    Diagnosis Date Noted  . No Resolved Ambulatory Problems   Past Medical History:  Diagnosis Date  . Anxiety   . Chest pain 03/31/2012  . Depression   . H/O prolonged Q-T interval on ECG 03/31/2012  . Hypertension   . Major depressive disorder 03/31/2012  . Seasonal allergies         Review of Systems See HPI.     Objective:   Physical Exam  Constitutional: She is oriented to person, place, and time. She appears well-developed and well-nourished.  HENT:  Head: Normocephalic and atraumatic.  Neck: Normal range of motion. Neck supple.  Cardiovascular: Normal rate, regular rhythm and normal heart sounds.   Pulmonary/Chest: Effort normal and breath sounds normal.  Lymphadenopathy:    She has no cervical adenopathy.  Neurological: She is alert and oriented to person, place, and time.  Psychiatric: She has a normal mood and affect. Her behavior is normal.          Assessment & Plan:  Marland KitchenMarland KitchenTyshana was seen  today for follow-up.  Diagnoses and all orders for this visit:  Essential hypertension -     hydrochlorothiazide (HYDRODIURIL) 25 MG tablet; Take 1 tablet (25 mg total) by mouth daily.  Submental mass  Visit for screening mammogram -     MM DIAG BREAST TOMO BILATERAL; Future -     MM DIAG BREAST TOMO BILATERAL   BP looks great. Refilled for 6 months.   Mass resolved. Reassurance likely lymphnode. Follow up as needed.   Information given about cologuard if not willing to do colonoscopy.    Pt declined flu shot.

## 2016-12-15 NOTE — Patient Instructions (Signed)
cologuard look into cost.  Mammogram ordered.

## 2016-12-16 ENCOUNTER — Telehealth: Payer: Self-pay | Admitting: Physician Assistant

## 2016-12-16 ENCOUNTER — Encounter: Payer: Self-pay | Admitting: Physician Assistant

## 2016-12-16 NOTE — Telephone Encounter (Signed)
Can we get pap results for [place] in [place]?

## 2016-12-25 NOTE — Telephone Encounter (Signed)
Filled out form to be faxed.

## 2017-01-05 DIAGNOSIS — F331 Major depressive disorder, recurrent, moderate: Secondary | ICD-10-CM | POA: Diagnosis not present

## 2017-01-05 DIAGNOSIS — F411 Generalized anxiety disorder: Secondary | ICD-10-CM | POA: Diagnosis not present

## 2017-01-09 DIAGNOSIS — F411 Generalized anxiety disorder: Secondary | ICD-10-CM | POA: Diagnosis not present

## 2017-01-09 DIAGNOSIS — Z79899 Other long term (current) drug therapy: Secondary | ICD-10-CM | POA: Diagnosis not present

## 2017-01-09 DIAGNOSIS — F331 Major depressive disorder, recurrent, moderate: Secondary | ICD-10-CM | POA: Diagnosis not present

## 2017-02-02 DIAGNOSIS — F411 Generalized anxiety disorder: Secondary | ICD-10-CM | POA: Diagnosis not present

## 2017-03-02 DIAGNOSIS — F411 Generalized anxiety disorder: Secondary | ICD-10-CM | POA: Diagnosis not present

## 2017-03-06 DIAGNOSIS — F411 Generalized anxiety disorder: Secondary | ICD-10-CM | POA: Diagnosis not present

## 2017-03-06 DIAGNOSIS — F331 Major depressive disorder, recurrent, moderate: Secondary | ICD-10-CM | POA: Diagnosis not present

## 2017-03-06 DIAGNOSIS — Z79899 Other long term (current) drug therapy: Secondary | ICD-10-CM | POA: Diagnosis not present

## 2017-04-06 DIAGNOSIS — F411 Generalized anxiety disorder: Secondary | ICD-10-CM | POA: Diagnosis not present

## 2017-04-22 DIAGNOSIS — F411 Generalized anxiety disorder: Secondary | ICD-10-CM | POA: Diagnosis not present

## 2017-05-05 ENCOUNTER — Other Ambulatory Visit: Payer: Self-pay | Admitting: Physician Assistant

## 2017-05-19 ENCOUNTER — Other Ambulatory Visit: Payer: Self-pay | Admitting: Physician Assistant

## 2017-05-20 DIAGNOSIS — F411 Generalized anxiety disorder: Secondary | ICD-10-CM | POA: Diagnosis not present

## 2017-05-21 ENCOUNTER — Encounter: Payer: Self-pay | Admitting: Physician Assistant

## 2017-06-03 DIAGNOSIS — F411 Generalized anxiety disorder: Secondary | ICD-10-CM | POA: Diagnosis not present

## 2017-06-03 DIAGNOSIS — F331 Major depressive disorder, recurrent, moderate: Secondary | ICD-10-CM | POA: Diagnosis not present

## 2017-06-10 DIAGNOSIS — F411 Generalized anxiety disorder: Secondary | ICD-10-CM | POA: Diagnosis not present

## 2017-06-25 ENCOUNTER — Encounter: Payer: Self-pay | Admitting: Physician Assistant

## 2017-06-25 ENCOUNTER — Ambulatory Visit (INDEPENDENT_AMBULATORY_CARE_PROVIDER_SITE_OTHER): Payer: BLUE CROSS/BLUE SHIELD | Admitting: Physician Assistant

## 2017-06-25 VITALS — BP 109/77 | HR 91 | Temp 98.3°F | Wt 166.0 lb

## 2017-06-25 DIAGNOSIS — R3129 Other microscopic hematuria: Secondary | ICD-10-CM

## 2017-06-25 DIAGNOSIS — R300 Dysuria: Secondary | ICD-10-CM | POA: Diagnosis not present

## 2017-06-25 DIAGNOSIS — R82998 Other abnormal findings in urine: Secondary | ICD-10-CM | POA: Diagnosis not present

## 2017-06-25 LAB — POCT URINALYSIS DIPSTICK
Bilirubin, UA: NEGATIVE
Glucose, UA: NEGATIVE
Ketones, UA: NEGATIVE
NITRITE UA: NEGATIVE
PH UA: 6 (ref 5.0–8.0)
PROTEIN UA: NEGATIVE
SPEC GRAV UA: 1.02 (ref 1.010–1.025)
UROBILINOGEN UA: 0.2 U/dL

## 2017-06-25 MED ORDER — NITROFURANTOIN MONOHYD MACRO 100 MG PO CAPS: 100.0000 mg | ORAL_CAPSULE | Freq: Two times a day (BID) | ORAL | 0 refills | Status: AC

## 2017-06-25 NOTE — Patient Instructions (Signed)

## 2017-06-25 NOTE — Progress Notes (Signed)
HPI:                                                                Lacretia NicksSusan Gloster is a 52 y.o. female who [facility] today for dysuria  Dysuria   This is a new problem. The current episode started 1 to 4 weeks ago. The problem occurs every urination. The problem has been unchanged. The quality of the pain is described as burning. The pain is mild. There has been no fever. There is a history of pyelonephritis (childhood). Associated symptoms include chills and frequency. Pertinent negatives include no flank pain or nausea.      Depression screen PHQ 2/9 12/15/2016  Decreased Interest 2  Down, Depressed, Hopeless 2  PHQ - 2 Score 4  Altered sleeping 1  Tired, decreased energy 3  Change in appetite 1  Feeling bad or failure about yourself  0  Trouble concentrating 2  Moving slowly or fidgety/restless 0  Suicidal thoughts 0  PHQ-9 Score 11    No flowsheet data found.    Past Medical History:  Diagnosis Date  . Anxiety   . Chest pain 03/31/2012  . Depression   . H/O prolonged Q-T interval on ECG 03/31/2012  . Hypertension   . Major depressive disorder 03/31/2012  . Seasonal allergies    Past Surgical History:  Procedure Laterality Date  . CESAREAN SECTION  1998   Social History   Tobacco Use  . Smoking status: Never Smoker  . Smokeless tobacco: Never Used  Substance Use Topics  . Alcohol use: No   family history includes Alcohol abuse in her brother and father; CAD in her mother; Cancer in her mother; Depression in her mother; Diabetes in her mother; Drug abuse in her brother; Heart failure in her father; Stroke in her father.    ROS: negative except as noted in the HPI  Medications: Current Outpatient Medications  Medication Sig Dispense Refill  . ARIPiprazole (ABILIFY) 2 MG tablet Take by mouth.    Marland Kitchen. buPROPion (WELLBUTRIN XL) 150 MG 24 hr tablet Take 150 mg by mouth daily.    Marland Kitchen. buPROPion (WELLBUTRIN  XL) 300 MG 24 hr tablet Take 300 mg by mouth daily.    . cyclobenzaprine (FLEXERIL) 5 MG tablet Take 1 tablet (5 mg total) by mouth 3 (three) times daily as needed for muscle spasms. 30 tablet 1  . desogestrel-ethinyl estradiol (VIORELE) 0.15-0.02/0.01 MG (21/5) tablet Take by mouth.    Marland Kitchen. FLUoxetine (PROZAC) 10 MG tablet Take 1 tablet by mouth daily.    Marland Kitchen. FLUoxetine (PROZAC) 40 MG capsule Take 2 capsules (80 mg total) by mouth daily. (Patient taking differently: Take 40 mg by mouth daily. ) 60 capsule 1  . hydrochlorothiazide (HYDRODIURIL) 25 MG tablet Take 1 tablet (25 mg total) by mouth daily. 90 tablet 4  . hydrochlorothiazide (HYDRODIURIL) 25 MG tablet TAKE 1 TABLET (25 MG TOTAL) BY MOUTH DAILY. MUST MAKE APPOINTMENT FOR FUTURE REFILLS. 15 tablet 0  . loratadine (CLARITIN) 10 MG tablet Take 10 mg by mouth daily.    . meloxicam (MOBIC) 15 MG tablet Take 1 tablet by mouth daily for 2 weeks, then 1 tablet as needed for pain 30 tablet 0  . SUMAtriptan (  IMITREX) 50 MG tablet Take 1 tablet (50 mg total) by mouth every 2 (two) hours as needed for migraine. May repeat in 2 hours if headache persists or recurs. 12 tablet 0  . vitamin B-12 (CYANOCOBALAMIN) 1000 MCG tablet Take 1 tablet (1,000 mcg total) by mouth daily. 30 tablet 1   No current facility-administered medications for this visit.    Allergies  Allergen Reactions  . Lac Bovis Nausea And Vomiting  . Peanut Oil Other (See Comments)  . Pork Allergy Nausea And Vomiting  . Codeine   . Doxycycline Nausea And Vomiting  . Lactose Intolerance (Gi)   . Peanut-Containing Drug Products   . Penicillins   . Sulfonamide Derivatives   . Trazodone And Nefazodone     Prolonged QTC       Objective:  BP 109/77   Pulse 91   Temp 98.3 F (36.8 C) (Oral)   Wt 166 lb (75.3 kg)   BMI 27.62 kg/m  Gen:  alert, not ill-appearing, no distress, appropriate for age HEENT: head normocephalic without obvious abnormality, conjunctiva and cornea clear,  trachea midline Pulm: Normal work of breathing, normal phonation GI: abdomen soft, there is RLQ tenderness, no rebound or guarding, no CVA tenderness Neuro: alert and oriented x 3, no tremor MSK: extremities atraumatic, normal gait and station Skin: intact, no rashes on exposed skin, no jaundice, no cyanosis Psych: well-groomed, cooperative, good eye contact, euthymic mood, affect mood-congruent, speech is articulate, and thought processes clear and goal-directed    No results found for this or any previous visit (from the past 72 hour(s)). No results found.    Assessment and Plan: 52 y.o. female with   1. Dysuria - POCT Urinalysis Dipstick positive for large blood and moderate leuks. Vitals are normal and there is no CVA tenderness. Treating empirically for uncomplicated cystitis with Macrobid. - Urine Culture pending - nitrofurantoin, macrocrystal-monohydrate, (MACROBID) 100 MG capsule; Take 1 capsule (100 mg total) by mouth 2 (two) times daily for 5 days.  Dispense: 10 capsule; Refill: 0  2. Leukocytes in urine  3. Microscopic hematuria     Patient education and anticipatory guidance given Patient agrees with treatment plan Follow-up as needed if symptoms worsen or fail to improve  Levonne Hubert PA-C

## 2017-06-27 LAB — URINE CULTURE
MICRO NUMBER:: 90295398
SPECIMEN QUALITY:: ADEQUATE

## 2017-06-29 NOTE — Progress Notes (Signed)
Urine culture was inconclusive and possibly contaminated with vaginal bacteria If you are still having symptoms, it is okay to finish the antibiotic If you are still symptomatic after the antibiotic, recommend you return for a follow-up exam with your PCP

## 2017-07-08 DIAGNOSIS — F411 Generalized anxiety disorder: Secondary | ICD-10-CM | POA: Diagnosis not present

## 2017-07-09 ENCOUNTER — Encounter: Payer: Self-pay | Admitting: Physician Assistant

## 2017-07-09 ENCOUNTER — Ambulatory Visit (INDEPENDENT_AMBULATORY_CARE_PROVIDER_SITE_OTHER): Payer: BLUE CROSS/BLUE SHIELD | Admitting: Physician Assistant

## 2017-07-09 VITALS — BP 124/87 | HR 88 | Temp 98.3°F | Wt 164.0 lb

## 2017-07-09 DIAGNOSIS — J302 Other seasonal allergic rhinitis: Secondary | ICD-10-CM

## 2017-07-09 DIAGNOSIS — J0100 Acute maxillary sinusitis, unspecified: Secondary | ICD-10-CM

## 2017-07-09 DIAGNOSIS — R0982 Postnasal drip: Secondary | ICD-10-CM

## 2017-07-09 DIAGNOSIS — H65192 Other acute nonsuppurative otitis media, left ear: Secondary | ICD-10-CM

## 2017-07-09 MED ORDER — FLUTICASONE PROPIONATE 50 MCG/ACT NA SUSP: 1.0000 | Freq: Every day | NASAL | 0 refills | Status: DC | PRN

## 2017-07-09 MED ORDER — MONTELUKAST SODIUM 10 MG PO TABS: 10.0000 mg | ORAL_TABLET | Freq: Every day | ORAL | 3 refills | Status: DC

## 2017-07-09 MED ORDER — CEFUROXIME AXETIL 250 MG PO TABS: 250.0000 mg | ORAL_TABLET | Freq: Two times a day (BID) | ORAL | 0 refills | Status: AC

## 2017-07-09 NOTE — Progress Notes (Signed)
HPI:                                                                Lacretia NicksSusan Mcmanaway is a 52 y.o. female who [facility] today for URI symptoms  Sore Throat   This is a new problem. The current episode started 1 to 4 weeks ago (x 10 days). The problem has been unchanged. Neither side of throat is experiencing more pain than the other. There has been no fever. The pain is moderate. Associated symptoms include congestion, ear pain and swollen glands. Pertinent negatives include no coughing, drooling, headaches, stridor, trouble swallowing or vomiting. She has tried NSAIDs for the symptoms.    No flowsheet data found.    Past Medical History:  Diagnosis Date  . Anxiety   . Chest pain 03/31/2012  . Depression   . H/O prolonged Q-T interval on ECG 03/31/2012  . Hypertension   . Major depressive disorder 03/31/2012  . Seasonal allergies    Past Surgical History:  Procedure Laterality Date  . CESAREAN SECTION  1998   Social History   Tobacco Use  . Smoking status: Never Smoker  . Smokeless tobacco: Never Used  Substance Use Topics  . Alcohol use: No   family history includes Alcohol abuse in her brother and father; CAD in her mother; Cancer in her mother; Depression in her mother; Diabetes in her mother; Drug abuse in her brother; Heart failure in her father; Stroke in her father.    ROS: negative except as noted in the HPI  Medications: Current Outpatient Medications  Medication Sig Dispense Refill  . ARIPiprazole (ABILIFY) 2 MG tablet Take by mouth.    Marland Kitchen. buPROPion (WELLBUTRIN XL) 150 MG 24 hr tablet Take 150 mg by mouth daily.    Marland Kitchen. buPROPion (WELLBUTRIN XL) 300 MG 24 hr tablet Take 300 mg by mouth daily.    . cefUROXime (CEFTIN) 250 MG tablet Take 1 tablet (250 mg total) by mouth 2 (two) times daily with a meal for 10 days. 20 tablet 0  . cyclobenzaprine (FLEXERIL) 5 MG tablet Take 1 tablet (5 mg total) by mouth 3  (three) times daily as needed for muscle spasms. 30 tablet 1  . desogestrel-ethinyl estradiol (VIORELE) 0.15-0.02/0.01 MG (21/5) tablet Take by mouth.    Marland Kitchen. FLUoxetine (PROZAC) 10 MG tablet Take 1 tablet by mouth daily.    Marland Kitchen. FLUoxetine (PROZAC) 40 MG capsule Take 2 capsules (80 mg total) by mouth daily. (Patient taking differently: Take 40 mg by mouth daily. ) 60 capsule 1  . fluticasone (FLONASE) 50 MCG/ACT nasal spray Place 1 spray into both nostrils daily as needed for allergies or rhinitis. 16 g 0  . hydrochlorothiazide (HYDRODIURIL) 25 MG tablet Take 1 tablet (25 mg total) by mouth daily. 90 tablet 4  . hydrochlorothiazide (HYDRODIURIL) 25 MG tablet TAKE 1 TABLET (25 MG TOTAL) BY MOUTH DAILY. MUST MAKE APPOINTMENT FOR FUTURE REFILLS. 15 tablet 0  . loratadine (CLARITIN) 10 MG tablet Take 10 mg by mouth daily.    . meloxicam (MOBIC) 15 MG tablet Take 1 tablet by mouth daily for 2 weeks, then 1 tablet as needed for pain 30 tablet 0  . montelukast (SINGULAIR) 10 MG tablet Take  1 tablet (10 mg total) by mouth at bedtime. 30 tablet 3  . SUMAtriptan (IMITREX) 50 MG tablet Take 1 tablet (50 mg total) by mouth every 2 (two) hours as needed for migraine. May repeat in 2 hours if headache persists or recurs. 12 tablet 0  . vitamin B-12 (CYANOCOBALAMIN) 1000 MCG tablet Take 1 tablet (1,000 mcg total) by mouth daily. 30 tablet 1   No current facility-administered medications for this visit.    Allergies  Allergen Reactions  . Lac Bovis Nausea And Vomiting  . Peanut Oil Other (See Comments)  . Pork Allergy Nausea And Vomiting  . Doxycycline Nausea And Vomiting  . Lactose Intolerance (Gi)   . Peanut-Containing Drug Products   . Trazodone And Nefazodone     Prolonged QTC  . Codeine Rash  . Penicillins Rash  . Sulfonamide Derivatives Rash       Objective:  BP 124/87   Pulse 88   Temp 98.3 F (36.8 C) (Oral)   Wt 164 lb (74.4 kg)   LMP 06/18/2017 (Approximate)   BMI 27.29 kg/m  Gen:   alert, not ill-appearing, no distress, appropriate for age HEENT: head normocephalic without obvious abnormality, conjunctiva and cornea clear, serous effusion of left TM, right TM clear, there is maxillary sinus tenderness, nasal mucosa edematous, post-nasal secretions, oropharynx with erythema, no edema or exudates, uvula midline,  trachea midline Pulm: Normal work of breathing, normal phonation, clear to auscultation bilaterally, no wheezes, rales or rhonchi CV: Normal rate, regular rhythm, s1 and s2 distinct, no murmurs, clicks or rubs  Neuro: alert and oriented x 3, no tremor MSK: extremities atraumatic, normal gait and station Skin: intact, no rashes on exposed skin, no jaundice, no cyanosis   No results found for this or any previous visit (from the past 72 hour(s)). No results found.    Assessment and Plan: 52 y.o. female with   1. Post-nasal drip - intranasal steroids daily, continue antihistamine, warm, salt water gargles  2. Seasonal allergic rhinitis, unspecified trigger - refilling allergy medications in advance of allergy season - fluticasone (FLONASE) 50 MCG/ACT nasal spray; Place 1 spray into both nostrils daily as needed for allergies or rhinitis.  Dispense: 16 g; Refill: 0 - montelukast (SINGULAIR) 10 MG tablet; Take 1 tablet (10 mg total) by mouth at bedtime.  Dispense: 30 tablet; Refill: 3  3. Acute non-recurrent maxillary sinusitis - 10 days of persistent sinus pressure and purulent rhinorrhea. Will cover for bacterial sinusitis, though this is still likely viral etiology. Allergic/intolerant to PCN, Sulfa, and Doxycycline - cefUROXime (CEFTIN) 250 MG tablet; Take 1 tablet (250 mg total) by mouth 2 (two) times daily with a meal for 10 days.  Dispense: 20 tablet; Refill: 0  4. Acute effusion of left ear - intranasal steroids daily     Patient education and anticipatory guidance given Patient agrees with treatment plan Follow-up as needed if symptoms worsen or  fail to improve  Levonne Hubert PA-C

## 2017-07-14 ENCOUNTER — Encounter: Payer: Self-pay | Admitting: Physician Assistant

## 2017-07-16 DIAGNOSIS — F411 Generalized anxiety disorder: Secondary | ICD-10-CM | POA: Diagnosis not present

## 2017-07-21 ENCOUNTER — Ambulatory Visit (INDEPENDENT_AMBULATORY_CARE_PROVIDER_SITE_OTHER): Payer: BLUE CROSS/BLUE SHIELD | Admitting: Physician Assistant

## 2017-07-21 ENCOUNTER — Encounter: Payer: Self-pay | Admitting: Physician Assistant

## 2017-07-21 VITALS — BP 131/84 | HR 78 | Temp 98.5°F | Ht 65.0 in | Wt 164.0 lb

## 2017-07-21 DIAGNOSIS — I10 Essential (primary) hypertension: Secondary | ICD-10-CM

## 2017-07-21 DIAGNOSIS — J302 Other seasonal allergic rhinitis: Secondary | ICD-10-CM

## 2017-07-21 DIAGNOSIS — Z9109 Other allergy status, other than to drugs and biological substances: Secondary | ICD-10-CM

## 2017-07-21 MED ORDER — HYDROCHLOROTHIAZIDE 25 MG PO TABS: 25.0000 mg | ORAL_TABLET | Freq: Every day | ORAL | 4 refills | Status: DC

## 2017-07-21 MED ORDER — METHYLPREDNISOLONE 4 MG PO TBPK: ORAL_TABLET | ORAL | 0 refills | Status: DC

## 2017-07-21 NOTE — Progress Notes (Signed)
Subjective:    Patient ID: Darlene Ochoa, female    DOB: April 29, 1965, 52 y.o.   MRN: [id number]  HPI Darlene Ochoa is a 52 year old female who presents today with a continued cough for the past couple weeks. She states she initially had some sore throat, congestion, and ear pain. Now she is mostly complaining of the ear pain and cough. She denies any fevers, chills, or continued congestion. She was seen on 3/21 and prescribed Singulair, Flonase, and Cefuroxime. She has not been taking the Singulair, or Flonase but did take the antibiotic. She has a strong hx of allergies. She took allergy shots when she was younger. Her allergies did get some better but have now taken a turn for the worst.   HTN- doing well on HCTZ. Denies any CP, palpitations, headaches, of vision changes.   .. Active Ambulatory Problems    Diagnosis Date Noted  . PALPITATIONS 08/02/2009  . Major depressive disorder 03/31/2012  . Chest pain 03/31/2012  . Shortness of breath 03/31/2012  . Migraine 03/31/2012  . Anxiety 04/27/2012  . Essential hypertension 04/27/2012  . Major depressive disorder, recurrent episode, moderate (HCC) 12/27/2012  . Generalized anxiety disorder 12/27/2012  . Prolonged Q-T interval on ECG 10/06/2013  . Vitamin D deficiency 08/01/2014  . B12 deficiency 08/01/2014  . Trapezius muscle spasm 08/21/2016  . Acute pain of left shoulder 08/21/2016  . 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 Seasonal allergic rhinitis 07/09/2017  . Post-nasal drip 07/09/2017  . Environmental allergies 07/21/2017  . Seasonal allergies 07/21/2017   Resolved Ambulatory Problems    Diagnosis Date Noted  . No Resolved Ambulatory Problems   Past Medical History:  Diagnosis Date  . Anxiety   . Chest pain 03/31/2012  . Depression   . H/O prolonged Q-T interval on ECG 03/31/2012  . Hypertension   . Major depressive disorder 03/31/2012  . Seasonal allergies        Review of Systems  Constitutional: Negative for chills and fever.   HENT: Positive for ear pain and postnasal drip. Negative for congestion, sinus pressure and sinus pain.   Eyes: Negative for pain and discharge.  Respiratory: Positive for cough.        Objective:   Physical Exam  Constitutional: She is oriented to person, place, and time. She appears well-developed and well-nourished. No distress.  HENT:  Head: Normocephalic and atraumatic.  Right Ear: Tympanic membrane and external ear normal.  Left Ear: Tympanic membrane and external ear normal.  Nose: Right sinus exhibits no maxillary sinus tenderness and no frontal sinus tenderness. Left sinus exhibits no maxillary sinus tenderness and no frontal sinus tenderness.  Mouth/Throat: No oropharyngeal exudate, posterior oropharyngeal edema, posterior oropharyngeal erythema or tonsillar abscesses.  Eyes: Conjunctivae are normal.  Neck: Normal range of motion.  Cardiovascular: Normal rate, regular rhythm and normal heart sounds.  Pulmonary/Chest: Effort normal and breath sounds normal. No respiratory distress. She has no wheezes.  Lymphadenopathy:    She has no cervical adenopathy.  Neurological: She is alert and oriented to person, place, and time.  Skin: Skin is warm and dry.  Psychiatric: She has a normal mood and affect. Her behavior is normal.          Assessment & Plan:  Marland KitchenMarland KitchenDiagnoses and all orders for this visit:  Environmental allergies -     methylPREDNISolone (MEDROL DOSEPAK) 4 MG TBPK tablet; Take as directed by package insert  Essential hypertension -     hydrochlorothiazide (HYDRODIURIL) 25 MG tablet; Take  1 tablet (25 mg total) by mouth daily.  Seasonal allergies -     methylPREDNISolone (MEDROL DOSEPAK) 4 MG TBPK tablet; Take as directed by package insert   Start medrol dose pack. I believe most of her symptoms are allergy related. Consider switching up antihistamine to zyrtec D with only doing decongestant for a short period. If still not improving add flonase regularly and/or  consider add Singulair.

## 2017-07-21 NOTE — Progress Notes (Deleted)
   Subjective:    Patient ID: Darlene Ochoa, female    DOB: Feb 01, 1966, 52 y.o.   MRN: [id number]  HPI ceftin singulair  Severe allergies when younger.    Review of Systems     Objective:   Physical Exam        Assessment & Plan:

## 2017-07-21 NOTE — Patient Instructions (Signed)
Allergies An allergy is when your body reacts to a substance in a way that is not normal. An allergic reaction can happen after you:  Eat something.  Breathe in something.  Touch something.  You can be allergic to:  Things that are only around during certain seasons, like molds and pollens.  Foods.  Drugs.  Insects.  Animal dander.  What are the signs or symptoms?  Puffiness (swelling). This may happen on the lips, face, tongue, mouth, or throat.  Sneezing.  Coughing.  Breathing loudly (wheezing).  Stuffy nose.  Tingling in the mouth.  A rash.  Itching.  Itchy, red, puffy areas of skin (hives).  Watery eyes.  Throwing up (vomiting).  Watery poop (diarrhea).  Dizziness.  Feeling faint or fainting.  Trouble breathing or swallowing.  A tight feeling in the chest.  A fast heartbeat. How is this diagnosed? Allergies can be diagnosed with:  A medical and family history.  Skin tests.  Blood tests.  A food diary. A food diary is a record of all the foods, drinks, and symptoms you have each day.  The results of an elimination diet. This diet involves making sure not to eat certain foods and then seeing what happens when you start eating them again.  How is this treated? There is no cure for allergies, but allergic reactions can be treated with medicine. Severe reactions usually need to be treated at a hospital. How is this prevented? The best way to prevent an allergic reaction is to avoid the thing you are allergic to. Allergy shots and medicines can also help prevent reactions in some cases. This information is not intended to replace advice given to you by your health care provider. Make sure you discuss any questions you have with your health care provider. Document Released: 08/02/2012 Document Revised: 12/03/2015 Document Reviewed: 01/17/2014 Elsevier Interactive Patient Education  2018 [employer]  

## 2017-07-23 DIAGNOSIS — F411 Generalized anxiety disorder: Secondary | ICD-10-CM | POA: Diagnosis not present

## 2017-08-10 ENCOUNTER — Ambulatory Visit (INDEPENDENT_AMBULATORY_CARE_PROVIDER_SITE_OTHER): Payer: BLUE CROSS/BLUE SHIELD | Admitting: Physician Assistant

## 2017-08-10 ENCOUNTER — Encounter: Payer: Self-pay | Admitting: Physician Assistant

## 2017-08-10 VITALS — BP 129/86 | HR 80 | Temp 98.7°F | Wt 160.0 lb

## 2017-08-10 DIAGNOSIS — R220 Localized swelling, mass and lump, head: Secondary | ICD-10-CM | POA: Diagnosis not present

## 2017-08-10 DIAGNOSIS — H1189 Other specified disorders of conjunctiva: Secondary | ICD-10-CM

## 2017-08-10 DIAGNOSIS — T783XXA Angioneurotic edema, initial encounter: Secondary | ICD-10-CM

## 2017-08-10 MED ORDER — RANITIDINE HCL 300 MG PO TABS: 150.0000 mg | ORAL_TABLET | Freq: Two times a day (BID) | ORAL | 0 refills | Status: DC

## 2017-08-10 MED ORDER — OFLOXACIN 0.3 % OP SOLN: 1.0000 [drp] | Freq: Four times a day (QID) | OPHTHALMIC | 0 refills | Status: AC

## 2017-08-10 MED ORDER — EPINEPHRINE 0.3 MG/0.3ML IJ SOAJ: 0.3000 mg | Freq: Once | INTRAMUSCULAR | 1 refills | Status: DC | PRN

## 2017-08-10 MED ORDER — CETIRIZINE HCL 10 MG PO TABS: 10.0000 mg | ORAL_TABLET | Freq: Two times a day (BID) | ORAL | 0 refills | Status: DC

## 2017-08-10 NOTE — Progress Notes (Signed)
HPI:                                                                Darlene Ochoa is a 52 y.o. female who presents to [place]: Primary Care [facility] today for tongue swelling  Pleasant 52 yo F with PMH of multiple medication and anvironmental allergies reports 3 weeks of persistent tongue swelling and pain. Symptoms are unchanged, but bothersome. Worse with talking and mastication. Denies itching. No oral lesions. No difficulty swallowing. Has tried Medrol dosepak without any improvement. Has been eating a soft diet, taking Claritin and Zyrtec-D, and using Numzit prn. No dyspnea, wheezing, chest tightness. No hx of anaphylaxis.  She started Cefuroxime on 07/09/17 for sinusitisand completed a 10 day course. This was felt to be the possible trigger for angioedema.   Also reports accidentally touching her left eye with Numzit. Immediately had burning and watering of the eye. She irrigated it at home and complains of foreign body sensation. Does not wear contact lenses.   Past Medical History:  Diagnosis Date  . Anxiety   . Chest pain 03/31/2012  . Depression   . H/O prolonged Q-T interval on ECG 03/31/2012  . Hypertension   . Major depressive disorder 03/31/2012  . Seasonal allergies    Past Surgical History:  Procedure Laterality Date  . CESAREAN SECTION  1998   Social History   Tobacco Use  . Smoking status: Never Smoker  . Smokeless tobacco: Never Used  Substance Use Topics  . Alcohol use: No   family history includes Alcohol abuse in her brother and father; CAD in her mother; Cancer in her mother; Depression in her mother; Diabetes in her mother; Drug abuse in her brother; Heart failure in her father; Stroke in her father.    ROS: negative except as noted in the HPI  Medications: Current Outpatient Medications  Medication Sig Dispense Refill  . buPROPion (WELLBUTRIN XL) 150 MG 24 hr tablet Take 150 mg by mouth daily.    Marland Kitchen buPROPion (WELLBUTRIN  XL) 300 MG 24 hr tablet Take 300 mg by mouth daily.    . cyclobenzaprine (FLEXERIL) 5 MG tablet Take 1 tablet (5 mg total) by mouth 3 (three) times daily as needed for muscle spasms. 30 tablet 1  . desogestrel-ethinyl estradiol (VIORELE) 0.15-0.02/0.01 MG (21/5) tablet Take by mouth.    Marland Kitchen FLUoxetine (PROZAC) 10 MG tablet Take 1 tablet by mouth daily.    Marland Kitchen FLUoxetine (PROZAC) 40 MG capsule Take 2 capsules (80 mg total) by mouth daily. (Patient taking differently: Take 120 mg by mouth daily. ) 60 capsule 1  . fluticasone (FLONASE) 50 MCG/ACT nasal spray Place 1 spray into both nostrils daily as needed for allergies or rhinitis. 16 g 0  . hydrochlorothiazide (HYDRODIURIL) 25 MG tablet Take 1 tablet (25 mg total) by mouth daily. 90 tablet 4  . loratadine (CLARITIN) 10 MG tablet Take 10 mg by mouth daily.    . meloxicam (MOBIC) 15 MG tablet Take 1 tablet by mouth daily for 2 weeks, then 1 tablet as needed for pain 30 tablet 0  . methylPREDNISolone (MEDROL DOSEPAK) 4 MG TBPK tablet Take as directed by package insert 21 tablet 0  . montelukast (SINGULAIR) 10 MG tablet Take 1 tablet (  10 mg total) by mouth at bedtime. 30 tablet 3  . SUMAtriptan (IMITREX) 50 MG tablet Take 1 tablet (50 mg total) by mouth every 2 (two) hours as needed for migraine. May repeat in 2 hours if headache persists or recurs. 12 tablet 0  . vitamin B-12 (CYANOCOBALAMIN) 1000 MCG tablet Take 1 tablet (1,000 mcg total) by mouth daily. 30 tablet 1   No current facility-administered medications for this visit.    Allergies  Allergen Reactions  . Lac Bovis Nausea And Vomiting  . Peanut Oil Other (See Comments)  . Pork Allergy Nausea And Vomiting  . Doxycycline Nausea And Vomiting  . Lactose Intolerance (Gi)   . Peanut-Containing Drug Products   . Trazodone And Nefazodone     Prolonged QTC  . Codeine Rash  . Penicillins Rash  . Sulfonamide Derivatives Rash       Objective:  BP 129/86   Pulse 80   Temp 98.7 F (37.1 C)  (Oral)   Wt 160 lb (72.6 kg)   BMI 26.63 kg/m  Gen:  alert, not ill-appearing, no distress, appropriate for age 60: head normocephalic without obvious abnormality, normal eyelids without swelling or debris, conjunctiva and cornea clear, no facial/lip swelling, tongue with mild edema with tenderness along the lateral posterior third bilaterally, no oral lesions, oropharynx clear without edema,  trachea midline Pulm: Normal work of breathing, normal phonation, clear to auscultation bilaterally, no wheezes, rales or rhonchi CV: Normal rate, regular rhythm, s1 and s2 distinct, no murmurs, clicks or rubs  Neuro: alert and oriented x 3, no tremor MSK: extremities atraumatic, normal gait and station Skin: intact, no rashes on exposed skin, no jaundice, no cyanosis     No results found for this or any previous visit (from the past 72 hour(s)). No results found.    Assessment and Plan: 52 y.o. female with  Angioedema - limited to the tongue, able to phonate, swallow and close her mouth fully - recommend switching to Zyrtec without decongestant and doubling dose to 20 mg bid for 1 week. Adding Ranitidine 300 mg bid for 1 week. - counseled on precautions. EpiPen provided in case of airway involvement - referral placed to Allergy/Immunology  Angioedema, initial encounter - Plan: CBC with Differential/Platelet, Comprehensive metabolic panel, Sed Rate (ESR), C-reactive protein, C4 complement, Food Specific IgG Allergy( Adult), cetirizine (ZYRTEC) 10 MG tablet, Ambulatory referral to Allergy, ranitidine (ZANTAC) 300 MG tablet, EPINEPHrine 0.3 mg/0.3 mL IJ SOAJ injection  Mild tongue swelling  Conjunctival irritation - Plan: ofloxacin (OCUFLOX) 0.3 % ophthalmic solution   Patient education and anticipatory guidance given Patient agrees with treatment plan Follow-up as needed if symptoms worsen or fail to improve  Darlyne Russian PA-C

## 2017-08-10 NOTE — Patient Instructions (Addendum)
For tongue swelling: - labs today - stop Claritin - increase generic Zyrtec to 10 mg twice a day (make sure it does not contain decongestant) - start Ranitidine twice a day  - follow-up with Allergist - avoid all cephalosporin drugs going forward - use Epi-Pen only if needed for severe swelling / difficulty breathing and seek emergency medical care  For eye: - no contact lenses - eye drops, 1 drop in affected eye 4 times daily for 5 days - follow-up with eye doctor if no improvement   Angioedema Angioedema is the sudden swelling of tissue in the body. Angioedema can affect any part of the body, but it most often affects the deeper parts of the skin, causing red, itchy patches (hives) to appear over the affected area. It often begins during the night and is found in the morning. Depending on the cause, angioedema may happen:  Only once.  Several times. It may come back in unpredictable patterns.  Repeatedly for several years. Over time, it may gradually stop coming back.  Angioedema can be life-threatening if it affects the air passages that you breathe through. What are the causes? This condition may be caused by:  Foods, such as milk, eggs, shellfish, wheat, or nuts.  Certain medicines, such as ACE inhibitors, antibiotics, nonsteroidal anti-inflammatory drugs, birth control pills, or dyes used in X-rays.  Insect stings.  Infections.  Angioedema can be inherited, and episodes can be triggered by:  Mild injury.  Dental work.  Surgery.  Stress.  Sudden changes in temperature.  Exercise.  In some cases, the cause of this condition is not known. What are the signs or symptoms? Symptoms of this condition depend on where the swelling happens. Symptoms may include:  Swollen skin.  Red, itchy patches of skin (hives).  Redness in the affected area.  Pain in the affected area.  Swollen lips or tongue.  Wheezing.  Breathing problems.  If your internal organs  are involved, symptoms may also include:  Nausea.  Abdominal pain.  Vomiting.  Difficulty swallowing.  Difficulty passing urine.  How is this diagnosed? This condition may be diagnosed based on:  An exam of the affected area.  Your medical history.  Whether anyone in your family has had this condition before.  A review of any medicines you have been taking.  Tests, including: ? Allergy skin tests to see if the condition was caused by an allergic reaction. ? Blood tests to see if the condition was caused by a gene. ? Tests to check for underlying diseases that could cause the condition.  How is this treated? Treatment for this condition depends on the cause. It may involve any of the following:  If something triggered the condition, making changes to keep it from triggering the condition again.  If the condition affects your breathing, having tubes placed in your airway to keep it open.  Taking medicines to treat symptoms or prevent future episodes. These may include: ? Antihistamines. ? Epinephrine injections. ? Steroids.  If your condition is severe, you may need to be treated at the hospital. Angioedema usually gets better in 24-48 hours. Follow these instructions at home:  Take over-the-counter and prescription medicines only as told by your health care provider.  If you were given medicines for emergency allergy treatment, always carry them with you.  Wear a medical bracelet as told by your health care provider.  If something triggers your condition, avoid the trigger, if possible.  If your condition is inherited and  you are thinking about having children, talk to your health care provider. It is important to discuss the risks of passing on the condition to your children. Contact a health care provider if:  You have repeated episodes of angioedema.  Episodes of angioedema start to happen more often than they used to, even after you take steps to prevent  them.  You have episodes of angioedema that are more severe than they have been before, even after you take steps to prevent them.  You are thinking about having children. Get help right away if:  You have severe swelling of your mouth, tongue, or lips.  You have trouble breathing.  You have trouble swallowing.  You faint. This information is not intended to replace advice given to you by your health care provider. Make sure you discuss any questions you have with your health care provider. Document Released: 06/16/2001 Document Revised: 11/03/2015 Document Reviewed: 10/16/2015 Elsevier Interactive Patient Education  [employer].

## 2017-08-13 ENCOUNTER — Encounter: Payer: Self-pay | Admitting: Physician Assistant

## 2017-08-17 ENCOUNTER — Ambulatory Visit (INDEPENDENT_AMBULATORY_CARE_PROVIDER_SITE_OTHER): Payer: BLUE CROSS/BLUE SHIELD | Admitting: Physician Assistant

## 2017-08-17 ENCOUNTER — Encounter: Payer: Self-pay | Admitting: Physician Assistant

## 2017-08-17 VITALS — BP 109/76 | HR 88 | Temp 99.1°F | Ht 65.0 in | Wt 160.0 lb

## 2017-08-17 DIAGNOSIS — H9203 Otalgia, bilateral: Secondary | ICD-10-CM | POA: Diagnosis not present

## 2017-08-17 DIAGNOSIS — H6983 Other specified disorders of Eustachian tube, bilateral: Secondary | ICD-10-CM

## 2017-08-17 DIAGNOSIS — T783XXA Angioneurotic edema, initial encounter: Secondary | ICD-10-CM | POA: Diagnosis not present

## 2017-08-17 DIAGNOSIS — J301 Allergic rhinitis due to pollen: Secondary | ICD-10-CM

## 2017-08-17 DIAGNOSIS — H6993 Unspecified Eustachian tube disorder, bilateral: Secondary | ICD-10-CM | POA: Insufficient documentation

## 2017-08-17 LAB — CBC WITH DIFFERENTIAL/PLATELET
BASOS PCT: 0.6 %
Basophils Absolute: 40 cells/uL (ref 0–200)
Eosinophils Absolute: 583 cells/uL — ABNORMAL HIGH (ref 15–500)
Eosinophils Relative: 8.7 %
HCT: 35.9 % (ref 35.0–45.0)
Hemoglobin: 12.3 g/dL (ref 11.7–15.5)
Lymphs Abs: 2506 cells/uL (ref 850–3900)
MCH: 29.6 pg (ref 27.0–33.0)
MCHC: 34.3 g/dL (ref 32.0–36.0)
MCV: 86.5 fL (ref 80.0–100.0)
MONOS PCT: 5.4 %
MPV: 10.7 fL (ref 7.5–12.5)
NEUTROS PCT: 47.9 %
Neutro Abs: 3209 cells/uL (ref 1500–7800)
PLATELETS: 309 10*3/uL (ref 140–400)
RBC: 4.15 10*6/uL (ref 3.80–5.10)
RDW: 12 % (ref 11.0–15.0)
TOTAL LYMPHOCYTE: 37.4 %
WBC mixed population: 362 cells/uL (ref 200–950)
WBC: 6.7 10*3/uL (ref 3.8–10.8)

## 2017-08-17 LAB — SEDIMENTATION RATE: SED RATE: 29 mm/h (ref 0–30)

## 2017-08-17 MED ORDER — AZELASTINE-FLUTICASONE 137-50 MCG/ACT NA SUSP: NASAL | 11 refills | Status: DC

## 2017-08-17 NOTE — Patient Instructions (Addendum)
[facility] [place], [place] [address] Office : [phone]   Eustachian Tube Dysfunction The eustachian tube connects the middle ear to the back of the nose. It regulates air pressure in the middle ear by allowing air to move between the ear and nose. It also helps to drain fluid from the middle ear space. When the eustachian tube does not function properly, air pressure, fluid, or both can build up in the middle ear. Eustachian tube dysfunction can affect one or both ears. What are the causes? This condition happens when the eustachian tube becomes blocked or cannot open normally. This may result from:  Ear infections.  Colds and other upper respiratory infections.  Allergies.  Irritation, such as from cigarette smoke or acid from the stomach coming up into the esophagus (gastroesophageal reflux).  Sudden changes in air pressure, such as from descending in an airplane.  Abnormal growths in the nose or throat, such as nasal polyps, tumors, or enlarged tissue at the back of the throat (adenoids).  What increases the risk? This condition may be more likely to develop in people who smoke and people who are overweight. Eustachian tube dysfunction may also be more likely to develop in children, especially children who have:  Certain birth defects of the mouth, such as cleft palate.  Large tonsils and adenoids.  What are the signs or symptoms? Symptoms of this condition may include:  A feeling of fullness in the ear.  Ear pain.  Clicking or popping noises in the ear.  Ringing in the ear.  Hearing loss.  Loss of balance.  Symptoms may get worse when the air pressure around you changes, such as when you travel to an area of high elevation or fly on an airplane. How is this diagnosed? This condition may be diagnosed based on:  Your symptoms.  A physical exam of your ear, nose, and throat.  Tests, such as those that measure: ? The movement of  your eardrum (tympanogram). ? Your hearing (audiometry).  How is this treated? Treatment depends on the cause and severity of your condition. If your symptoms are mild, you may be able to relieve your symptoms by moving air into ("popping") your ears. If you have symptoms of fluid in your ears, treatment may include:  Decongestants.  Antihistamines.  Nasal sprays or ear drops that contain medicines that reduce swelling (steroids).  In some cases, you may need to have a procedure to drain the fluid in your eardrum (myringotomy). In this procedure, a small tube is placed in the eardrum to:  Drain the fluid.  Restore the air in the middle ear space.  Follow these instructions at home:  Take over-the-counter and prescription medicines only as told by your health care provider.  Use techniques to help pop your ears as recommended by your health care provider. These may include: ? Chewing gum. ? Yawning. ? Frequent, forceful swallowing. ? Closing your mouth, holding your nose closed, and gently blowing as if you are trying to blow air out of your nose.  Do not do any of the following until your health care provider approves: ? Travel to high altitudes. ? Fly in airplanes. ? Work in a Estate agent or room. ? Scuba dive.  Keep your ears dry. Dry your ears completely after showering or bathing.  Do not smoke.  Keep all follow-up visits as told by your health care provider. This is important. Contact a health care provider if:  Your symptoms  do not go away after treatment.  Your symptoms come back after treatment.  You are unable to pop your ears.  You have: ? A fever. ? Pain in your ear. ? Pain in your head or neck. ? Fluid draining from your ear.  Your hearing suddenly changes.  You become very dizzy.  You lose your balance. This information is not intended to replace advice given to you by your health care provider. Make sure you discuss any questions you have  with your health care provider. Document Released: 05/04/2015 Document Revised: 09/13/2015 Document Reviewed: 04/26/2014 Elsevier Interactive Patient Education  [employer].

## 2017-08-17 NOTE — Progress Notes (Signed)
HPI:                                                                Darlene Ochoa is a 52 y.o. female who presents to [name]: Primary Care Sports Medicine today for b/l ear pain  Otalgia   There is pain in both (L>R) ears. This is a new problem. The current episode started in the past 7 days. The problem has been waxing and waning. Maximum temperature: tactile fever. The pain is moderate. Associated symptoms include rhinorrhea. Pertinent negatives include no coughing or hearing loss. She has tried acetaminophen and NSAIDs for the symptoms. The treatment provided moderate relief.   Past Medical History:  Diagnosis Date  . Anxiety   . Chest pain 03/31/2012  . Depression   . H/O prolonged Q-T interval on ECG 03/31/2012  . Hypertension   . Major depressive disorder 03/31/2012  . Seasonal allergies    Past Surgical History:  Procedure Laterality Date  . CESAREAN SECTION  1998   Social History   Tobacco Use  . Smoking status: Never Smoker  . Smokeless tobacco: Never Used  Substance Use Topics  . Alcohol use: No   family history includes Alcohol abuse in her brother and father; CAD in her mother; Cancer in her mother; Depression in her mother; Diabetes in her mother; Drug abuse in her brother; Heart failure in her father; Stroke in her father.    ROS: negative except as noted in the HPI  Medications: Current Outpatient Medications  Medication Sig Dispense Refill  . buPROPion (WELLBUTRIN XL) 150 MG 24 hr tablet Take 150 mg by mouth daily.    Marland Kitchen buPROPion (WELLBUTRIN XL) 300 MG 24 hr tablet Take 300 mg by mouth daily.    . cetirizine (ZYRTEC) 10 MG tablet Take 1 tablet (10 mg total) by mouth 2 (two) times daily for 7 days. 14 tablet 0  . desogestrel-ethinyl estradiol (VIORELE) 0.15-0.02/0.01 MG (21/5) tablet Take by mouth.    . EPINEPHrine 0.3 mg/0.3 mL IJ SOAJ injection Inject 0.3 mLs (0.3 mg total) into the muscle once as needed for up to 1 dose  (anaphylaxis). 1 Device 1  . FLUoxetine (PROZAC) 10 MG tablet Take 1 tablet by mouth daily.    Marland Kitchen FLUoxetine (PROZAC) 40 MG capsule Take 2 capsules (80 mg total) by mouth daily. (Patient taking differently: Take 120 mg by mouth daily. ) 60 capsule 1  . hydrochlorothiazide (HYDRODIURIL) 25 MG tablet Take 1 tablet (25 mg total) by mouth daily. 90 tablet 4  . loratadine (CLARITIN) 10 MG tablet Take 10 mg by mouth daily.    . ranitidine (ZANTAC) 300 MG tablet Take 0.5 tablets (150 mg total) by mouth 2 (two) times daily. 60 tablet 0  . SUMAtriptan (IMITREX) 50 MG tablet Take 1 tablet (50 mg total) by mouth every 2 (two) hours as needed for migraine. May repeat in 2 hours if headache persists or recurs. 12 tablet 0  . vitamin B-12 (CYANOCOBALAMIN) 1000 MCG tablet Take 1 tablet (1,000 mcg total) by mouth daily. 30 tablet 1   No current facility-administered medications for this visit.    Allergies  Allergen Reactions  . Cefuroxime Swelling  . Lac Bovis Nausea And Vomiting  . Peanut Oil Other (See  Comments)  . Pork Allergy Nausea And Vomiting  . Doxycycline Nausea And Vomiting  . Lactose Intolerance (Gi)   . Peanut-Containing Drug Products   . Trazodone And Nefazodone     Prolonged QTC  . Codeine Rash  . Penicillins Rash  . Sulfonamide Derivatives Rash       Objective:  BP 109/76   Pulse 88   Temp 99.1 F (37.3 C) (Oral)   Wt 160 lb (72.6 kg)   BMI 26.63 kg/m  Gen:  alert, not ill-appearing, no distress, appropriate for age HEENT: head normocephalic without obvious abnormality, conjunctiva and cornea clear, TM's are pearly gray with serous effusion, nasal mucosa edematous, oropharynx clear, moist mucous membranes, trachea midline Pulm: Normal work of breathing, normal phonation Neuro: alert and oriented x 3, no tremor MSK: extremities atraumatic, normal gait and station Skin: intact, no rashes on exposed skin, no jaundice, no cyanosis   No results found for this or any previous  visit (from the past 72 hour(s)). No results found.    Assessment and Plan: 52 y.o. female with   Bilateral serous effusions on exam, no evidence of OM, no hearing loss. This is likely eustachian tube dysfunction from allergic rhinitis. Recommend she start Singulair prescribed by her PCP, continue antihistamine of choice, start intranasal corticosteroid and nasal saline rinses. She will follow-up with allergist due to her recent angioedema.  Otalgia of both ears  Eustachian tube dysfunction, bilateral - Plan: Azelastine-Fluticasone 137-50 MCG/ACT SUSP  Allergic rhinitis due to pollen, unspecified seasonality   Patient education and anticipatory guidance given Patient agrees with treatment plan Follow-up as needed if symptoms worsen or fail to improve  Levonne Hubert PA-C

## 2017-08-18 LAB — COMPREHENSIVE METABOLIC PANEL
AG Ratio: 1.6 (calc) (ref 1.0–2.5)
ALKALINE PHOSPHATASE (APISO): 87 U/L (ref 33–130)
ALT: 17 U/L (ref 6–29)
AST: 17 U/L (ref 10–35)
Albumin: 4.2 g/dL (ref 3.6–5.1)
BILIRUBIN TOTAL: 0.2 mg/dL (ref 0.2–1.2)
BUN: 8 mg/dL (ref 7–25)
CALCIUM: 9 mg/dL (ref 8.6–10.4)
CO2: 28 mmol/L (ref 20–32)
Chloride: 102 mmol/L (ref 98–110)
Creat: 0.85 mg/dL (ref 0.50–1.05)
Globulin: 2.7 g/dL (calc) (ref 1.9–3.7)
Glucose, Bld: 97 mg/dL (ref 65–99)
POTASSIUM: 3.4 mmol/L — AB (ref 3.5–5.3)
SODIUM: 141 mmol/L (ref 135–146)
TOTAL PROTEIN: 6.9 g/dL (ref 6.1–8.1)

## 2017-08-18 LAB — C4 COMPLEMENT: C4 Complement: 21 mg/dL (ref 15–57)

## 2017-08-18 LAB — C-REACTIVE PROTEIN: CRP: 9.2 mg/L — ABNORMAL HIGH (ref <8.0)

## 2017-08-19 DIAGNOSIS — H02054 Trichiasis without entropian left upper eyelid: Secondary | ICD-10-CM | POA: Diagnosis not present

## 2017-08-19 NOTE — Progress Notes (Signed)
Potassium is a little low. Increase dietary potassium (google potassium-rich foods) Eosinophils are elevated which is consistent with an allergic reaction Keep follow-up appt with Allergist

## 2017-08-20 DIAGNOSIS — F331 Major depressive disorder, recurrent, moderate: Secondary | ICD-10-CM | POA: Diagnosis not present

## 2017-08-20 DIAGNOSIS — F411 Generalized anxiety disorder: Secondary | ICD-10-CM | POA: Diagnosis not present

## 2017-08-21 LAB — FOOD SPECIFIC IGG ALLERGY( ADULT)
CASEIN IGE: 4.9 ug/mL — AB (ref <2.0)
COFFEE (F221) IGG: 4.5 ug/mL — ABNORMAL HIGH (ref <2.0)
Cacao (Chocolate)IgG: 3.4 ug/mL — ABNORMAL HIGH (ref <2.0)
Codfish/Scrod IgG: 2.4 ug/mL — ABNORMAL HIGH (ref <2.0)
Corn IgG: 4.2 ug/mL — ABNORMAL HIGH (ref <2.0)
Egg white, IgG: 5.1 ug/mL — ABNORMAL HIGH (ref <2.0)
PEANUT (F13) IGG: 2.8 ug/mL — AB (ref <2.0)
Soybean IgG: 2.5 ug/mL — ABNORMAL HIGH (ref <2.0)
Tomato IgG: 2.8 ug/mL — ABNORMAL HIGH (ref <2.0)
WHEAT IGG: 7.1 ug/mL — AB (ref <2.0)
YEAST (F45) IGG: 5.6 ug/mL — AB (ref <2.0)

## 2017-09-02 DIAGNOSIS — J302 Other seasonal allergic rhinitis: Secondary | ICD-10-CM | POA: Diagnosis not present

## 2017-09-02 DIAGNOSIS — Z91018 Allergy to other foods: Secondary | ICD-10-CM | POA: Diagnosis not present

## 2017-09-02 DIAGNOSIS — T783XXA Angioneurotic edema, initial encounter: Secondary | ICD-10-CM | POA: Diagnosis not present

## 2017-09-10 DIAGNOSIS — F411 Generalized anxiety disorder: Secondary | ICD-10-CM | POA: Diagnosis not present

## 2017-09-16 ENCOUNTER — Ambulatory Visit: Payer: Self-pay | Admitting: Allergy and Immunology

## 2017-09-21 DIAGNOSIS — Z6827 Body mass index (BMI) 27.0-27.9, adult: Secondary | ICD-10-CM | POA: Diagnosis not present

## 2017-09-21 DIAGNOSIS — Z01419 Encounter for gynecological examination (general) (routine) without abnormal findings: Secondary | ICD-10-CM | POA: Diagnosis not present

## 2017-09-22 DIAGNOSIS — F331 Major depressive disorder, recurrent, moderate: Secondary | ICD-10-CM | POA: Diagnosis not present

## 2017-09-22 DIAGNOSIS — F411 Generalized anxiety disorder: Secondary | ICD-10-CM | POA: Diagnosis not present

## 2017-10-01 DIAGNOSIS — F411 Generalized anxiety disorder: Secondary | ICD-10-CM | POA: Diagnosis not present

## 2017-10-16 DIAGNOSIS — F411 Generalized anxiety disorder: Secondary | ICD-10-CM | POA: Diagnosis not present

## 2017-10-28 DIAGNOSIS — F331 Major depressive disorder, recurrent, moderate: Secondary | ICD-10-CM | POA: Diagnosis not present

## 2017-10-28 DIAGNOSIS — Z79899 Other long term (current) drug therapy: Secondary | ICD-10-CM | POA: Diagnosis not present

## 2017-10-28 DIAGNOSIS — F411 Generalized anxiety disorder: Secondary | ICD-10-CM | POA: Diagnosis not present

## 2017-10-29 DIAGNOSIS — F411 Generalized anxiety disorder: Secondary | ICD-10-CM | POA: Diagnosis not present

## 2017-10-29 DIAGNOSIS — F331 Major depressive disorder, recurrent, moderate: Secondary | ICD-10-CM | POA: Diagnosis not present

## 2017-11-12 DIAGNOSIS — F411 Generalized anxiety disorder: Secondary | ICD-10-CM | POA: Diagnosis not present

## 2017-11-28 DIAGNOSIS — R609 Edema, unspecified: Secondary | ICD-10-CM | POA: Diagnosis not present

## 2017-11-28 DIAGNOSIS — Z881 Allergy status to other antibiotic agents status: Secondary | ICD-10-CM | POA: Diagnosis not present

## 2017-11-28 DIAGNOSIS — Z885 Allergy status to narcotic agent status: Secondary | ICD-10-CM | POA: Diagnosis not present

## 2017-11-28 DIAGNOSIS — Z882 Allergy status to sulfonamides status: Secondary | ICD-10-CM | POA: Diagnosis not present

## 2017-11-28 DIAGNOSIS — M25561 Pain in right knee: Secondary | ICD-10-CM | POA: Diagnosis not present

## 2017-11-28 DIAGNOSIS — W19XXXA Unspecified fall, initial encounter: Secondary | ICD-10-CM | POA: Diagnosis not present

## 2017-11-28 DIAGNOSIS — Z91010 Allergy to peanuts: Secondary | ICD-10-CM | POA: Diagnosis not present

## 2017-11-28 DIAGNOSIS — Z880 Allergy status to penicillin: Secondary | ICD-10-CM | POA: Diagnosis not present

## 2017-11-28 DIAGNOSIS — W108XXA Fall (on) (from) other stairs and steps, initial encounter: Secondary | ICD-10-CM | POA: Diagnosis not present

## 2017-11-28 DIAGNOSIS — S79911A Unspecified injury of right hip, initial encounter: Secondary | ICD-10-CM | POA: Diagnosis not present

## 2017-11-28 DIAGNOSIS — Z888 Allergy status to other drugs, medicaments and biological substances status: Secondary | ICD-10-CM | POA: Diagnosis not present

## 2017-11-28 DIAGNOSIS — Z79899 Other long term (current) drug therapy: Secondary | ICD-10-CM | POA: Diagnosis not present

## 2017-11-28 DIAGNOSIS — S76011A Strain of muscle, fascia and tendon of right hip, initial encounter: Secondary | ICD-10-CM | POA: Diagnosis not present

## 2017-11-28 DIAGNOSIS — M25551 Pain in right hip: Secondary | ICD-10-CM | POA: Diagnosis not present

## 2017-11-28 DIAGNOSIS — S8991XA Unspecified injury of right lower leg, initial encounter: Secondary | ICD-10-CM | POA: Diagnosis not present

## 2017-11-28 DIAGNOSIS — Z91011 Allergy to milk products: Secondary | ICD-10-CM | POA: Diagnosis not present

## 2017-11-28 DIAGNOSIS — S79921A Unspecified injury of right thigh, initial encounter: Secondary | ICD-10-CM | POA: Diagnosis not present

## 2017-11-28 DIAGNOSIS — Z91018 Allergy to other foods: Secondary | ICD-10-CM | POA: Diagnosis not present

## 2017-12-08 DIAGNOSIS — F411 Generalized anxiety disorder: Secondary | ICD-10-CM | POA: Diagnosis not present

## 2017-12-24 DIAGNOSIS — F411 Generalized anxiety disorder: Secondary | ICD-10-CM | POA: Diagnosis not present

## 2018-01-07 ENCOUNTER — Other Ambulatory Visit: Payer: Self-pay | Admitting: Physician Assistant

## 2018-01-07 DIAGNOSIS — T783XXA Angioneurotic edema, initial encounter: Secondary | ICD-10-CM

## 2018-01-11 DIAGNOSIS — F411 Generalized anxiety disorder: Secondary | ICD-10-CM | POA: Diagnosis not present

## 2018-02-10 DIAGNOSIS — F411 Generalized anxiety disorder: Secondary | ICD-10-CM | POA: Diagnosis not present

## 2018-02-19 DIAGNOSIS — F3342 Major depressive disorder, recurrent, in full remission: Secondary | ICD-10-CM | POA: Diagnosis not present

## 2018-02-19 DIAGNOSIS — F331 Major depressive disorder, recurrent, moderate: Secondary | ICD-10-CM | POA: Diagnosis not present

## 2018-02-19 DIAGNOSIS — Z79899 Other long term (current) drug therapy: Secondary | ICD-10-CM | POA: Diagnosis not present

## 2018-02-19 DIAGNOSIS — F411 Generalized anxiety disorder: Secondary | ICD-10-CM | POA: Diagnosis not present

## 2018-02-24 DIAGNOSIS — F411 Generalized anxiety disorder: Secondary | ICD-10-CM | POA: Diagnosis not present

## 2018-04-02 DIAGNOSIS — F411 Generalized anxiety disorder: Secondary | ICD-10-CM | POA: Diagnosis not present

## 2018-04-05 ENCOUNTER — Encounter: Payer: Self-pay | Admitting: Physician Assistant

## 2018-04-05 ENCOUNTER — Ambulatory Visit (INDEPENDENT_AMBULATORY_CARE_PROVIDER_SITE_OTHER): Payer: BLUE CROSS/BLUE SHIELD | Admitting: Physician Assistant

## 2018-04-05 VITALS — BP 127/85 | HR 69 | Temp 98.5°F | Wt 153.0 lb

## 2018-04-05 DIAGNOSIS — R300 Dysuria: Secondary | ICD-10-CM | POA: Diagnosis not present

## 2018-04-05 DIAGNOSIS — R82998 Other abnormal findings in urine: Secondary | ICD-10-CM | POA: Diagnosis not present

## 2018-04-05 LAB — POCT URINALYSIS DIPSTICK
BILIRUBIN UA: NEGATIVE
GLUCOSE UA: NEGATIVE
Ketones, UA: NEGATIVE
Nitrite, UA: NEGATIVE
Protein, UA: NEGATIVE
Spec Grav, UA: 1.025 (ref 1.010–1.025)
Urobilinogen, UA: 0.2 E.U./dL
pH, UA: 6.5 (ref 5.0–8.0)

## 2018-04-05 MED ORDER — NITROFURANTOIN MONOHYD MACRO 100 MG PO CAPS: 100.0000 mg | ORAL_CAPSULE | Freq: Two times a day (BID) | ORAL | 0 refills | Status: AC

## 2018-04-05 NOTE — Patient Instructions (Signed)

## 2018-04-05 NOTE — Progress Notes (Signed)
HPI:                                                                Darlene Ochoa is a 52 y.o. female who presents to [name]: Primary Care Sports Medicine today for dysuria  Dysuria   This is a new problem. The current episode started in the past 7 days. The problem occurs intermittently. The problem has been waxing and waning. The quality of the pain is described as burning. There has been no fever. There is a history of pyelonephritis ("long time ago"). Associated symptoms include urgency. Pertinent negatives include no discharge, hematuria or vomiting. She has tried increased fluids for the symptoms. The treatment provided no relief.    Past Medical History:  Diagnosis Date  . Anxiety   . Chest pain 03/31/2012  . Depression   . H/O prolonged Q-T interval on ECG 03/31/2012  . Hypertension   . Major depressive disorder 03/31/2012  . Seasonal allergies    Past Surgical History:  Procedure Laterality Date  . CESAREAN SECTION  1998   Social History   Tobacco Use  . Smoking status: Never Smoker  . Smokeless tobacco: Never Used  Substance Use Topics  . Alcohol use: No   family history includes Alcohol abuse in her brother and father; CAD in her mother; Cancer in her mother; Depression in her mother; Diabetes in her mother; Drug abuse in her brother; Heart failure in her father; Stroke in her father.    ROS: negative except as noted in the HPI  Medications: Current Outpatient Medications  Medication Sig Dispense Refill  . ARIPiprazole (ABILIFY) 2 MG tablet aripiprazole 2 mg tablet    . ARIPiprazole (ABILIFY) 5 MG tablet aripiprazole 5 mg tablet    . Azelastine-Fluticasone 137-50 MCG/ACT SUSP One spray each nostril BID 1 Bottle 11  . benztropine (COGENTIN) 1 MG tablet TAKE ONE TABLET (1 MG DOSE) BY MOUTH 2 (TWO) TIMES DAILY.  0  . buPROPion (WELLBUTRIN XL) 150 MG 24 hr tablet Take 150 mg by mouth daily.    Marland Kitchen buPROPion (WELLBUTRIN XL) 300 MG 24 hr tablet  Take 300 mg by mouth daily.    . cetirizine (ZYRTEC) 10 MG tablet Take by mouth.    . Cholecalciferol (VITAMIN D3) 50000 units CAPS Vitamin D2 50,000 unit capsule    . cyclobenzaprine (FLEXERIL) 5 MG tablet cyclobenzaprine 5 mg tablet  TAKE 1 TABLET (5 MG TOTAL) BY MOUTH 3 (THREE) TIMES DAILY AS NEEDED FOR MUSCLE SPASMS.    Marland Kitchen desogestrel-ethinyl estradiol (VIORELE) 0.15-0.02/0.01 MG (21/5) tablet Take by mouth.    . EPINEPHrine 0.3 mg/0.3 mL IJ SOAJ injection Inject 0.3 mLs (0.3 mg total) into the muscle once as needed for up to 1 dose (anaphylaxis). 1 Device 1  . ESTARYLLA 0.25-35 MG-MCG tablet     . FLUoxetine (PROZAC) 10 MG tablet Take 1 tablet by mouth daily.    Marland Kitchen FLUoxetine (PROZAC) 40 MG capsule Take 2 capsules (80 mg total) by mouth daily. (Patient taking differently: Take 120 mg by mouth daily. ) 60 capsule 1  . hydrochlorothiazide (HYDRODIURIL) 25 MG tablet Take 1 tablet (25 mg total) by mouth daily. 90 tablet 4  . levofloxacin (LEVAQUIN) 500 MG tablet levofloxacin 500 mg tablet    .  lidocaine (XYLOCAINE) 5 % ointment lidocaine 5 % topical ointment  APPLY TO BURNS EVERY SIX HOURS AS NEEDED    . loratadine (CLARITIN) 10 MG tablet Take 10 mg by mouth daily.    . meloxicam (MOBIC) 15 MG tablet TAKE 1 TABLET BY MOUTH DAILY FOR 2 WEEKS, THEN 1 TABLET AS NEEDED FOR PAIN    . montelukast (SINGULAIR) 10 MG tablet Take 10 mg by mouth at bedtime.    . norgestimate-ethinyl estradiol (SPRINTEC 28) 0.25-35 MG-MCG tablet Sprintec (28) 0.25 mg-35 mcg tablet  TAKE 1 TABLET(S) EVERY DAY BY ORAL ROUTE.    Marland Kitchen nystatin (MYCOSTATIN) 100000 UNIT/ML suspension nystatin 100,000 unit/mL oral suspension  TAKE 5 MLS BY MOUTH 4 TIMES A DAY. SWISH 15 SECONDS AND SWALLOW. USE FOR UP TO 1 DAY AFTER SYMPTOMS.    Marland Kitchen ofloxacin (OCUFLOX) 0.3 % ophthalmic solution Place 1 drop into the left eye every 6 (six) hours.    . predniSONE (DELTASONE) 10 MG tablet prednisone 10 mg tablet    . propranolol (INDERAL) 40 MG tablet  propranolol 40 mg tablet  TAKE 1 TABLET (40 MG TOTAL) BY MOUTH 2 (TWO) TIMES DAILY.    . ranitidine (ZANTAC) 300 MG tablet Take by mouth.    . SUMAtriptan (IMITREX) 50 MG tablet Take 1 tablet (50 mg total) by mouth every 2 (two) hours as needed for migraine. May repeat in 2 hours if headache persists or recurs. 12 tablet 0  . triamcinolone (KENALOG) 0.1 % paste triamcinolone acetonide 0.1 % dental paste  USE AS DIRECTED 1 APPLICATION IN THE MOUTH OR THROAT 3 (THREE) TIMES DAILY. FOR UP TO TWO WEEKS.    . trihexyphenidyl (ARTANE) 2 MG tablet TAKE ONE TABLET (2 MG DOSE) BY MOUTH 2 (TWO) TIMES DAILY WITH MEALS.  0  . vitamin B-12 (CYANOCOBALAMIN) 1000 MCG tablet Take 1 tablet (1,000 mcg total) by mouth daily. 30 tablet 1   No current facility-administered medications for this visit.    Allergies  Allergen Reactions  . Cefuroxime Swelling  . Lac Bovis Nausea And Vomiting  . Peanut Oil Other (See Comments)  . Pork Allergy Nausea And Vomiting  . Doxycycline Nausea And Vomiting  . Lactose Intolerance (Gi)   . Peanut-Containing Drug Products   . Trazodone And Nefazodone     Prolonged QTC  . Codeine Rash  . Penicillins Rash  . Sulfonamide Derivatives Rash       Objective:  BP 127/85   Pulse 69   Temp 98.5 F (36.9 C) (Oral)   Wt 153 lb (69.4 kg)   BMI 25.46 kg/m  Gen:  alert, not ill-appearing, no distress, appropriate for age HEENT: head normocephalic without obvious abnormality, conjunctiva and cornea clear, trachea midline Pulm: Normal work of breathing, normal phonation GI: abdomen soft, nontender, no CVA tenderness Neuro: alert and oriented x 3, no tremor MSK: extremities atraumatic, normal gait and station Skin: intact, no rashes on exposed skin, no jaundice, no cyanosis   Results for orders placed or performed in visit on 04/05/18 (from the past 72 hour(s))  POCT Urinalysis Dipstick     Status: Abnormal   Collection Time: 04/05/18 10:45 AM  Result Value Ref Range    Color, UA yellow    Clarity, UA cloudy    Glucose, UA Negative Negative   Bilirubin, UA negative    Ketones, UA negative    Spec Grav, UA 1.025 1.010 - 1.025   Blood, UA trace    pH, UA 6.5 5.0 - 8.0  Protein, UA Negative Negative   Urobilinogen, UA 0.2 0.2 or 1.0 E.U./dL   Nitrite, UA negative    Leukocytes, UA Large (3+) (A) Negative   Appearance     Odor     No results found.    Assessment and Plan: 52 y.o. female with   .Darlene Ochoa was seen today for dysuria.  Diagnoses and all orders for this visit:  Dysuria -     POCT Urinalysis Dipstick -     Urine Culture   POC UA positive for large leuks and trace blood No CVA tenderness, sx of renal colic or evidence of pyelonephritis Treating empirically for uncomplicated cystitis Urine cx pending    Patient education and anticipatory guidance given Patient agrees with treatment plan Follow-up as needed if symptoms worsen or fail to improve  Levonne Hubertharley E. Aarti Mankowski PA-C

## 2018-04-06 LAB — URINE CULTURE
MICRO NUMBER:: 91502506
SPECIMEN QUALITY:: ADEQUATE

## 2018-04-22 DIAGNOSIS — F411 Generalized anxiety disorder: Secondary | ICD-10-CM | POA: Diagnosis not present

## 2018-05-28 ENCOUNTER — Ambulatory Visit (INDEPENDENT_AMBULATORY_CARE_PROVIDER_SITE_OTHER): Payer: BLUE CROSS/BLUE SHIELD | Admitting: Physician Assistant

## 2018-05-28 ENCOUNTER — Ambulatory Visit (INDEPENDENT_AMBULATORY_CARE_PROVIDER_SITE_OTHER): Payer: BLUE CROSS/BLUE SHIELD

## 2018-05-28 ENCOUNTER — Encounter: Payer: Self-pay | Admitting: Physician Assistant

## 2018-05-28 VITALS — BP 122/83 | HR 78 | Ht 65.0 in | Wt 148.0 lb

## 2018-05-28 DIAGNOSIS — R200 Anesthesia of skin: Secondary | ICD-10-CM

## 2018-05-28 DIAGNOSIS — Z1211 Encounter for screening for malignant neoplasm of colon: Secondary | ICD-10-CM | POA: Diagnosis not present

## 2018-05-28 DIAGNOSIS — M25532 Pain in left wrist: Secondary | ICD-10-CM

## 2018-05-28 DIAGNOSIS — M79642 Pain in left hand: Secondary | ICD-10-CM

## 2018-05-28 DIAGNOSIS — R202 Paresthesia of skin: Secondary | ICD-10-CM

## 2018-05-28 DIAGNOSIS — M79645 Pain in left finger(s): Secondary | ICD-10-CM

## 2018-05-28 DIAGNOSIS — Z1231 Encounter for screening mammogram for malignant neoplasm of breast: Secondary | ICD-10-CM

## 2018-05-28 DIAGNOSIS — M19042 Primary osteoarthritis, left hand: Secondary | ICD-10-CM | POA: Diagnosis not present

## 2018-05-28 DIAGNOSIS — M79644 Pain in right finger(s): Secondary | ICD-10-CM | POA: Diagnosis not present

## 2018-05-28 DIAGNOSIS — M79641 Pain in right hand: Secondary | ICD-10-CM

## 2018-05-28 DIAGNOSIS — M778 Other enthesopathies, not elsewhere classified: Secondary | ICD-10-CM

## 2018-05-28 DIAGNOSIS — M25531 Pain in right wrist: Secondary | ICD-10-CM

## 2018-05-28 DIAGNOSIS — M19041 Primary osteoarthritis, right hand: Secondary | ICD-10-CM | POA: Diagnosis not present

## 2018-05-28 MED ORDER — DICLOFENAC SODIUM 75 MG PO TBEC: 75.0000 mg | DELAYED_RELEASE_TABLET | Freq: Two times a day (BID) | ORAL | 2 refills | Status: DC

## 2018-05-28 MED ORDER — DICLOFENAC SODIUM 1 % TD GEL: 4.0000 g | Freq: Four times a day (QID) | TRANSDERMAL | 1 refills | Status: DC

## 2018-05-28 NOTE — Progress Notes (Signed)
Subjective:    Patient ID: Darlene Ochoa, female    DOB: January 15, 1966, 53 y.o.   MRN: [id number]  HPI  Pt is a 53 yo female with over a year of bilateral thumb, wrist, hand pain. Over the past 2-3 months she feels like she is lossing strength. She is not able to pick up pots or drinks. Not able to open containers. She is having more and more numbness and tingling as well. Worse with movement and using as well as in the morning. She does wear night splints which help some. She does not take any medication.   .. Active Ambulatory Problems    Diagnosis Date Noted  . PALPITATIONS 08/02/2009  . Major depressive disorder 03/31/2012  . Chest pain 03/31/2012  . Shortness of breath 03/31/2012  . Migraine 03/31/2012  . Anxiety 04/27/2012  . Essential hypertension 04/27/2012  . Major depressive disorder, recurrent episode, moderate (HCC) 12/27/2012  . Generalized anxiety disorder 12/27/2012  . Prolonged Q-T interval on ECG 10/06/2013  . Vitamin D deficiency 08/01/2014  . B12 deficiency 08/01/2014  . Trapezius muscle spasm 08/21/2016  . Acute pain of left shoulder 08/21/2016  . 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 Seasonal allergic rhinitis 07/09/2017  . Post-nasal drip 07/09/2017  . Environmental allergies 07/21/2017  . Seasonal allergies 07/21/2017  . Angio-edema 08/10/2017  . Eustachian tube dysfunction, bilateral 08/17/2017   Resolved Ambulatory Problems    Diagnosis Date Noted  . No Resolved Ambulatory Problems   Past Medical History:  Diagnosis Date  . Depression   . H/O prolonged Q-T interval on ECG 03/31/2012  . Hypertension     Review of Systems See HPI.     Objective:   Physical Exam Vitals signs reviewed.  Constitutional:      Appearance: Normal appearance.  HENT:     Head: Normocephalic and atraumatic.  Cardiovascular:     Rate and Rhythm: Normal rate and regular rhythm.  Pulmonary:     Effort: Pulmonary effort is normal.  Musculoskeletal:     Comments: Right wrist:   Negative tinels.  Positive phalens.  Positive finklestein.     Left wrist: Positive phalens and tinels. CMC joint very tender to palpation.  Positive finklestein.    Neurological:     General: No focal deficit present.     Mental Status: She is alert and oriented to person, place, and time.  Psychiatric:        Mood and Affect: Mood normal.           Assessment & Plan:  Marland KitchenMarland KitchenSolae was seen today for wrist pain.  Diagnoses and all orders for this visit:  Bilateral wrist pain -     DG Hand Complete Left -     DG Hand Complete Right -     diclofenac (VOLTAREN) 75 MG EC tablet; Take 1 tablet (75 mg total) by mouth 2 (two) times daily. -     diclofenac sodium (VOLTAREN) 1 % GEL; Apply 4 g topically 4 (four) times daily. To affected joint. -     NCV with EMG(electromyography)  Colon cancer screening -     Cologuard  Bilateral thumb pain -     DG Hand Complete Left -     DG Hand Complete Right -     diclofenac (VOLTAREN) 75 MG EC tablet; Take 1 tablet (75 mg total) by mouth 2 (two) times daily. -     diclofenac sodium (VOLTAREN) 1 % GEL; Apply 4 g topically 4 (four) times  daily. To affected joint.  Bilateral hand pain -     DG Hand Complete Left -     DG Hand Complete Right -     diclofenac (VOLTAREN) 75 MG EC tablet; Take 1 tablet (75 mg total) by mouth 2 (two) times daily. -     diclofenac sodium (VOLTAREN) 1 % GEL; Apply 4 g topically 4 (four) times daily. To affected joint. -     NCV with EMG(electromyography)  Tendonitis of both wrists -     DG Hand Complete Left -     DG Hand Complete Right -     diclofenac (VOLTAREN) 75 MG EC tablet; Take 1 tablet (75 mg total) by mouth 2 (two) times daily. -     diclofenac sodium (VOLTAREN) 1 % GEL; Apply 4 g topically 4 (four) times daily. To affected joint. -     NCV with EMG(electromyography)  Numbness and tingling in both hands -     DG Hand Complete Left -     DG Hand Complete Right -     diclofenac (VOLTAREN) 75 MG  EC tablet; Take 1 tablet (75 mg total) by mouth 2 (two) times daily. -     diclofenac sodium (VOLTAREN) 1 % GEL; Apply 4 g topically 4 (four) times daily. To affected joint. -     NCV with EMG(electromyography)  Visit for screening mammogram -     MM 3D SCREEN BREAST BILATERAL   Discussed need for CPE. cologuard and mammogram agreed to after discussion.   Concern for arthritis in combination with carpal tunnel syndrome and wrist tendonitis. Will get xrays and EMG.  voltaren gel given. Oral diclofenac given as well.  Brace as thumb spica in it continue to wear.  Ice as needed.  Given some tendonitis exercises that she can start to try.   Follow up in 4 weeks.

## 2018-05-28 NOTE — Patient Instructions (Addendum)
Will get nerve conduction test.  Will get xrays.    De Quervain's Tenosynovitis  De Quervain's tenosynovitis is a condition that causes inflammation of the tendon on the thumb side of the wrist. Tendons are cords of tissue that connect bones to muscles. The tendons in the hand pass through a tunnel called a sheath. A slippery layer of tissue (synovium) lets the tendons move smoothly in the sheath. With de Quervain's tenosynovitis, the sheath swells or thickens, causing friction and pain. The condition is also called de Quervain's disease and de Quervain's syndrome. It occurs most often in women who are 74-85 years old. What are the causes? The exact cause of this condition is not known. It may be associated with overuse of the hand and wrist. What increases the risk? You are more likely to develop this condition if you:  Use your hands far more than normal, especially if you repeat certain movements that involve twisting your hand or using a tight grip.  Are pregnant.  Are a middle-aged woman.  Have rheumatoid arthritis.  Have diabetes. What are the signs or symptoms? The main symptom of this condition is pain on the thumb side of the wrist. The pain may get worse when you grasp something or turn your wrist. Other symptoms may include:  Pain that extends up the forearm.  Swelling of your wrist and hand.  Trouble moving the thumb and wrist.  A sensation of snapping in the wrist.  A bump filled with fluid (cyst) in the area of the pain. How is this diagnosed? This condition may be diagnosed based on:  Your symptoms and medical history.  A physical exam. During the exam, your health care provider may do a simple test Lourena Simmonds test) that involves pulling your thumb and wrist to see if this causes pain. You may also need to have an X-ray. How is this treated? Treatment for this condition may include:  Avoiding any activity that causes pain and swelling.  Taking medicines.  Anti-inflammatory medicines and corticosteroid injections may be used to reduce inflammation and relieve pain.  Wearing a splint.  Having surgery. This may be needed if other treatments do not work. Once the pain and swelling has gone down:  Physical therapy. This includes stretching and strengthening exercises.  Occupational therapy. This includes adjusting how you move your wrist. Follow these instructions at home: If you have a splint:  Wear the splint as told by your health care provider. Remove it only as told by your health care provider.  Loosen the splint if your fingers tingle, become numb, or turn cold and blue.  Keep the splint clean.  If the splint is not waterproof: ? Do not let it get wet. ? Cover it with a watertight covering when you take a bath or a shower. Managing pain, stiffness, and swelling   Avoid movements and activities that cause pain and swelling in the wrist area.  If directed, put ice on the painful area. This may be helpful after doing activities that involve the sore wrist. ? Put ice in a plastic bag. ? Place a towel between your skin and the bag. ? Leave the ice on for 20 minutes, 2-3 times a day.  Move your fingers often to avoid stiffness and to lessen swelling.  Raise (elevate) the injured area above the level of your heart while you are sitting or lying down. General instructions  Return to your normal activities as told by your health care provider. Ask your  health care provider what activities are safe for you.  Take over-the-counter and prescription medicines only as told by your health care provider.  Keep all follow-up visits as told by your health care provider. This is important. Contact a health care provider if:  Your pain medicine does not help.  Your pain gets worse.  You develop new symptoms. Summary  De Quervain's tenosynovitis is a condition that causes inflammation of the tendon on the thumb side of the  wrist.  The condition occurs most often in women who are 90-72 years old.  The exact cause of this condition is not known. It may be associated with overuse of the hand and wrist.  Treatment starts with avoiding activity that causes pain or swelling in the wrist area. Other treatment may include wearing a splint and taking medicine. Sometimes, surgery is needed. This information is not intended to replace advice given to you by your health care provider. Make sure you discuss any questions you have with your health care provider. Document Released: 12/31/2000 Document Revised: 10/08/2017 Document Reviewed: 03/16/2017 Elsevier Interactive Patient Education  2019 [employer].

## 2018-05-31 NOTE — Progress Notes (Signed)
Call pt: lots of arthritis. Spurring around the 2nd and 3rd distal joints. Mild arthritis of the thumb. There is a bony density in the radial side of wrist possible from trauma. Treatment plan stays the same. Try oral NSAId and gel if no improvement consider sports medicine for injections. Will see what nerve conduction test show to determine if carpal tunnel as well.

## 2018-06-01 ENCOUNTER — Encounter: Payer: Self-pay | Admitting: Physician Assistant

## 2018-06-01 DIAGNOSIS — M25532 Pain in left wrist: Principal | ICD-10-CM

## 2018-06-01 DIAGNOSIS — M79645 Pain in left finger(s): Secondary | ICD-10-CM

## 2018-06-01 DIAGNOSIS — M79642 Pain in left hand: Secondary | ICD-10-CM

## 2018-06-01 DIAGNOSIS — R202 Paresthesia of skin: Secondary | ICD-10-CM

## 2018-06-01 DIAGNOSIS — M25531 Pain in right wrist: Secondary | ICD-10-CM | POA: Insufficient documentation

## 2018-06-01 DIAGNOSIS — M778 Other enthesopathies, not elsewhere classified: Secondary | ICD-10-CM | POA: Insufficient documentation

## 2018-06-01 DIAGNOSIS — M79644 Pain in right finger(s): Secondary | ICD-10-CM | POA: Insufficient documentation

## 2018-06-01 DIAGNOSIS — M79641 Pain in right hand: Secondary | ICD-10-CM | POA: Insufficient documentation

## 2018-06-01 DIAGNOSIS — R200 Anesthesia of skin: Secondary | ICD-10-CM | POA: Insufficient documentation

## 2018-06-04 DIAGNOSIS — F411 Generalized anxiety disorder: Secondary | ICD-10-CM | POA: Diagnosis not present

## 2018-06-17 DIAGNOSIS — F3342 Major depressive disorder, recurrent, in full remission: Secondary | ICD-10-CM | POA: Diagnosis not present

## 2018-06-17 DIAGNOSIS — F411 Generalized anxiety disorder: Secondary | ICD-10-CM | POA: Diagnosis not present

## 2018-06-18 ENCOUNTER — Telehealth: Payer: Self-pay

## 2018-06-18 MED ORDER — TRAMADOL HCL 50 MG PO TABS: 50.0000 mg | ORAL_TABLET | Freq: Four times a day (QID) | ORAL | 0 refills | Status: DC | PRN

## 2018-06-18 NOTE — Telephone Encounter (Signed)
Done

## 2018-06-18 NOTE — Telephone Encounter (Signed)
Has she been scheduled for EMG yet?   Is she using diclofenac?   I can send some as needed pain medication but can only given small quanity for as needed. What pharmacy?

## 2018-06-18 NOTE — Telephone Encounter (Signed)
Darlene Ochoa called and states she is still having wrist pain. She is requesting some pain medication. Please advise.

## 2018-06-18 NOTE — Telephone Encounter (Signed)
[facility]  EMG has been scheduled.

## 2018-06-21 DIAGNOSIS — F411 Generalized anxiety disorder: Secondary | ICD-10-CM | POA: Diagnosis not present

## 2018-06-30 ENCOUNTER — Ambulatory Visit (INDEPENDENT_AMBULATORY_CARE_PROVIDER_SITE_OTHER): Payer: BLUE CROSS/BLUE SHIELD

## 2018-06-30 ENCOUNTER — Other Ambulatory Visit: Payer: Self-pay

## 2018-06-30 DIAGNOSIS — Z1231 Encounter for screening mammogram for malignant neoplasm of breast: Secondary | ICD-10-CM | POA: Diagnosis not present

## 2018-07-01 ENCOUNTER — Other Ambulatory Visit: Payer: Self-pay

## 2018-07-01 DIAGNOSIS — I10 Essential (primary) hypertension: Secondary | ICD-10-CM

## 2018-07-01 MED ORDER — HYDROCHLOROTHIAZIDE 25 MG PO TABS: 25.0000 mg | ORAL_TABLET | Freq: Every day | ORAL | 1 refills | Status: DC

## 2018-07-02 NOTE — Progress Notes (Signed)
Normal mammogram. Follow up in 1 year.

## 2018-07-06 ENCOUNTER — Other Ambulatory Visit: Payer: Self-pay

## 2018-07-06 ENCOUNTER — Ambulatory Visit: Payer: BLUE CROSS/BLUE SHIELD | Admitting: Neurology

## 2018-07-06 ENCOUNTER — Encounter: Payer: Self-pay | Admitting: Neurology

## 2018-07-06 ENCOUNTER — Ambulatory Visit (INDEPENDENT_AMBULATORY_CARE_PROVIDER_SITE_OTHER): Payer: BLUE CROSS/BLUE SHIELD | Admitting: Neurology

## 2018-07-06 DIAGNOSIS — M79644 Pain in right finger(s): Secondary | ICD-10-CM

## 2018-07-06 DIAGNOSIS — R200 Anesthesia of skin: Secondary | ICD-10-CM

## 2018-07-06 DIAGNOSIS — R202 Paresthesia of skin: Secondary | ICD-10-CM | POA: Diagnosis not present

## 2018-07-06 DIAGNOSIS — M79645 Pain in left finger(s): Principal | ICD-10-CM

## 2018-07-06 DIAGNOSIS — M79646 Pain in unspecified finger(s): Secondary | ICD-10-CM

## 2018-07-06 NOTE — Procedures (Signed)
     HISTORY:  Darlene Ochoa is a 53 year old patient with a history of bilateral discomfort in the saddle joints of the thumbs bilaterally.  The patient has discomfort with movement of the thumb, she may also have some numbness in the fingers.  She denies any neck pain or pain down the arms on either side.  The right arm is more painful.  She is being evaluated for a possible neuropathy or a radiculopathy.  NERVE CONDUCTION STUDIES:  Nerve conduction studies were performed on both upper extremities. The distal motor latencies and motor amplitudes for the median and ulnar nerves were within normal limits. The nerve conduction velocities for these nerves were also normal. The sensory latencies for the median, radial, and ulnar nerves were normal. The F wave latencies for the ulnar nerves were within normal limits.   EMG STUDIES:  EMG study was performed on the right upper extremity:  The first dorsal interosseous muscle reveals 2 to 4 K units with full recruitment. No fibrillations or positive waves were noted. The abductor pollicis brevis muscle reveals 2 to 4 K units with full recruitment. No fibrillations or positive waves were noted. The extensor indicis proprius muscle reveals 1 to 3 K units with full recruitment. No fibrillations or positive waves were noted. The pronator teres muscle reveals 2 to 3 K units with full recruitment. No fibrillations or positive waves were noted. The biceps muscle reveals 1 to 2 K units with full recruitment. No fibrillations or positive waves were noted. The triceps muscle reveals 2 to 4 K units with full recruitment. No fibrillations or positive waves were noted. The anterior deltoid muscle reveals 2 to 3 K units with full recruitment. No fibrillations or positive waves were noted. The cervical paraspinal muscles were tested at 2 levels. No abnormalities of insertional activity were seen at either level tested. There was good relaxation.   IMPRESSION:   Nerve conduction studies done on both upper extremities were within normal limits.  No evidence of a neuropathy was seen.  EMG evaluation of the right upper extremity was unremarkable, no evidence of an overlying cervical radiculopathy was seen.  Marlan Palau MD 07/06/2018 10:53 AM  [facility] [address] [place], [place] [address]  Phone [phone] Fax [phone]

## 2018-07-06 NOTE — Progress Notes (Signed)
MNC    Nerve / Sites Muscle Latency Ref. Amplitude Ref. Rel Amp Segments Distance Velocity Ref. Area    ms ms mV mV %  cm m/s m/s mVms  R Median - APB     Wrist APB 3.6 ?4.4 7.6 ?4.0 100 Wrist - APB 7   24.8     Upper arm APB 7.8  7.1  93.7 Upper arm - Wrist 21 50 ?49 24.4  L Median - APB     Wrist APB 3.9 ?4.4 7.5 ?4.0 100 Wrist - APB 7   22.1     Upper arm APB 7.8  6.7  90.3 Upper arm - Wrist 20 51 ?49 22.1  R Ulnar - ADM     Wrist ADM 2.7 ?3.3 11.4 ?6.0 100 Wrist - ADM 7   39.6     B.Elbow ADM 5.8  10.7  93.5 B.Elbow - Wrist 19 62 ?49 37.9     A.Elbow ADM 7.7  10.1  94.9 A.Elbow - B.Elbow 10 53 ?49 36.3         A.Elbow - Wrist      L Ulnar - ADM     Wrist ADM 3.1 ?3.3 11.0 ?6.0 100 Wrist - ADM 7   38.3     B.Elbow ADM 6.3  10.3  93.8 B.Elbow - Wrist 18 58 ?49 37.5     A.Elbow ADM 8.0  9.6  93.1 A.Elbow - B.Elbow 10 56 ?49 36.3         A.Elbow - Wrist                 SNC    Nerve / Sites Rec. Site Peak Lat Ref.  Amp Ref. Segments Distance    ms ms V V  cm  R Radial - Anatomical snuff box (Forearm)     Forearm Wrist 2.5 ?2.9 29 ?15 Forearm - Wrist 10  L Radial - Anatomical snuff box (Forearm)     Forearm Wrist 2.6 ?2.9 28 ?15 Forearm - Wrist 10  R Median - Orthodromic (Dig II, Mid palm)     Dig II Wrist 3.3 ?3.4 18 ?10 Dig II - Wrist 13  L Median - Orthodromic (Dig II, Mid palm)     Dig II Wrist 3.4 ?3.4 14 ?10 Dig II - Wrist 13  R Ulnar - Orthodromic, (Dig V, Mid palm)     Dig V Wrist 2.8 ?3.1 11 ?5 Dig V - Wrist 11  L Ulnar - Orthodromic, (Dig V, Mid palm)     Dig V Wrist 2.8 ?3.1 9 ?5 Dig V - Wrist 88                 F  Wave    Nerve F Lat Ref.   ms ms  R Ulnar - ADM 29.1 ?32.0  L Ulnar - ADM 28.7 ?32.0

## 2018-07-06 NOTE — Progress Notes (Signed)
Please refer to EMG and nerve conduction procedure note.  

## 2018-07-07 NOTE — Progress Notes (Signed)
Normal nerve study of both extremities. Need to see Dr. Denyse Amass or Dr. Karie Schwalbe in office to look at treated for thumb and wrist pain if not improving.

## 2018-07-29 ENCOUNTER — Ambulatory Visit (INDEPENDENT_AMBULATORY_CARE_PROVIDER_SITE_OTHER): Payer: BLUE CROSS/BLUE SHIELD | Admitting: Sports Medicine

## 2018-07-29 ENCOUNTER — Encounter: Payer: Self-pay | Admitting: Sports Medicine

## 2018-07-29 DIAGNOSIS — J302 Other seasonal allergic rhinitis: Secondary | ICD-10-CM

## 2018-07-29 DIAGNOSIS — M180 Bilateral primary osteoarthritis of first carpometacarpal joints: Secondary | ICD-10-CM | POA: Insufficient documentation

## 2018-07-29 MED ORDER — FEXOFENADINE HCL 180 MG PO TABS: 180.0000 mg | ORAL_TABLET | Freq: Every day | ORAL | 3 refills | Status: DC

## 2018-07-29 MED ORDER — AZELASTINE HCL 0.1 % NA SOLN: 2.0000 | Freq: Two times a day (BID) | NASAL | 1 refills | Status: DC

## 2018-07-29 MED ORDER — AZELASTINE HCL 0.05 % OP SOLN: 2.0000 [drp] | Freq: Two times a day (BID) | OPHTHALMIC | 11 refills | Status: DC

## 2018-07-29 NOTE — Assessment & Plan Note (Signed)
Allergic rhinitis, unsure if there is a sinus infection or ear infection because the visit was on the phone. Switching from loratadine to Allegra, adding Optivar and nasal azelastine. If no better in 1.5 to 2 weeks I will see her in person.

## 2018-07-29 NOTE — Progress Notes (Signed)
Virtual Visit via Telephone   I connected with  Lacretia NicksSusan Leise  on 07/29/18 by telephone and verified that I am speaking with the correct person using two identifiers.   I discussed the limitations, risks, security and privacy concerns of performing an evaluation and management service by telephone, including the higher likelihood of inaccurate diagnosis and treatment, and the availability of in person appointments.  We also discussed the likely need of an additional face to face encounter for complete and high quality delivery of care.  I also discussed with the patient that there may be a patient responsible charge related to this service. The patient expressed understanding and wishes to proceed.  Subjective:    CC: Upper respiratory symptoms  HPI: For 1 week now this pleasant 53 year old female has had runny nose, itchy watery eyes, mild cough.  No fevers, chills, muscle aches, body aches.  Symptoms are mild, worsening.  In addition she has had hand pain for some time now, had x-rays that showed widespread degenerative changes, she was referred to a neurologist, it sounds as though nerve conduction studies were negative.  She has been using Voltaren without much improvement.  I reviewed the past medical history, family history, social history, surgical history, and allergies today and no changes were needed.  Please see the problem list section below in epic for further details.  Past Medical History: Past Medical History:  Diagnosis Date  . Anxiety   . Chest pain 03/31/2012  . Depression   . H/O prolonged Q-T interval on ECG 03/31/2012  . Hypertension   . Major depressive disorder 03/31/2012  . Seasonal allergies    Past Surgical History: Past Surgical History:  Procedure Laterality Date  . CESAREAN SECTION  1998   Social History: Social History   Socioeconomic History  . Marital status: Married    Spouse name: Not on file  . Number of children: Not on file  . Years of  education: Not on file  . Highest education level: Not on file  Occupational History  . Not on file  Social Needs  . Financial resource strain: Not on file  . Food insecurity:    Worry: Not on file    Inability: Not on file  . Transportation needs:    Medical: Not on file    Non-medical: Not on file  Tobacco Use  . Smoking status: Never Smoker  . Smokeless tobacco: Never Used  Substance and Sexual Activity  . Alcohol use: No  . Drug use: No  . Sexual activity: Yes    Partners: Male  Lifestyle  . Physical activity:    Days per week: Not on file    Minutes per session: Not on file  . Stress: Not on file  Relationships  . Social connections:    Talks on phone: Not on file    Gets together: Not on file    Attends religious service: Not on file    Active member of club or organization: Not on file    Attends meetings of clubs or organizations: Not on file    Relationship status: Not on file  Other Topics Concern  . Not on file  Social History Narrative  . Not on file   Family History: Family History  Problem Relation Age of Onset  . Diabetes Mother   . Cancer Mother        Breast  . Depression Mother   . CAD Mother   . Heart failure Father   .  Stroke Father   . Alcohol abuse Father   . Alcohol abuse Brother   . Drug abuse Brother    Allergies: Allergies  Allergen Reactions  . Cefuroxime Swelling  . Lac Bovis Nausea And Vomiting  . Peanut Oil Other (See Comments)  . Pork Allergy Nausea And Vomiting  . Doxycycline Nausea And Vomiting  . Lactose Intolerance (Gi)   . Peanut-Containing Drug Products   . Trazodone And Nefazodone     Prolonged QTC  . Codeine Rash  . Penicillins Rash  . Sulfonamide Derivatives Rash   Medications: See med rec.  Review of Systems: No fevers, chills, night sweats, weight loss, chest pain, or shortness of breath.   Objective:    General: Speaking full sentences, no audible heavy breathing.  Sounds alert and appropriately  interactive.  No other physical exam performed due to the non-face to face nature of this visit.  Impression and Recommendations:    Seasonal allergic rhinitis Allergic rhinitis, unsure if there is a sinus infection or ear infection because the visit was on the phone. Switching from loratadine to Allegra, adding Optivar and nasal azelastine. If no better in 1.5 to 2 weeks I will see her in person.  Primary osteoarthritis of both first carpometacarpal joints No improvements with bracing, negative nerve conduction study. 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 Return when she desires for [facility] injections. Again, this visit was on the phone so no physical exam was able to be performed.   I discussed the above assessment and treatment plan with the patient. The patient was provided an opportunity to ask questions and all were answered. The patient agreed with the plan and demonstrated an understanding of the instructions.   The patient was advised to call back or seek an in-person evaluation if the symptoms worsen or if the condition fails to improve as anticipated.   I provided 21 minutes of non-face-to-face time during this encounter, less than 50% of this was time needed to gather information, review chart, records, and complete documentation.   ___________________________________________ Ihor Austin. Benjamin Stain, M.D., ABFM., CAQSM. Primary Care and Sports Medicine [facility]  Adjunct Professor of Family Medicine  [employer]

## 2018-07-29 NOTE — Assessment & Plan Note (Signed)
No improvements with bracing, negative nerve conduction study. 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 Return when she desires for [facility] injections. Again, this visit was on the phone so no physical exam was able to be performed.

## 2018-08-20 ENCOUNTER — Other Ambulatory Visit: Payer: Self-pay | Admitting: Sports Medicine

## 2018-08-20 DIAGNOSIS — J302 Other seasonal allergic rhinitis: Secondary | ICD-10-CM

## 2018-08-28 ENCOUNTER — Other Ambulatory Visit: Payer: Self-pay | Admitting: Physician Assistant

## 2018-08-28 DIAGNOSIS — M25531 Pain in right wrist: Secondary | ICD-10-CM

## 2018-08-28 DIAGNOSIS — M79644 Pain in right finger(s): Secondary | ICD-10-CM

## 2018-08-28 DIAGNOSIS — R200 Anesthesia of skin: Secondary | ICD-10-CM

## 2018-08-28 DIAGNOSIS — M79641 Pain in right hand: Secondary | ICD-10-CM

## 2018-08-28 DIAGNOSIS — M778 Other enthesopathies, not elsewhere classified: Secondary | ICD-10-CM

## 2018-09-01 ENCOUNTER — Other Ambulatory Visit: Payer: Self-pay | Admitting: Sports Medicine

## 2018-09-01 DIAGNOSIS — J302 Other seasonal allergic rhinitis: Secondary | ICD-10-CM

## 2018-09-16 ENCOUNTER — Telehealth: Payer: Self-pay | Admitting: Neurology

## 2018-09-16 NOTE — Telephone Encounter (Signed)
Received note from [employer] that patient has not completed Cologuard screening. Need to call patient to encourage her to have this done. They can be reached at [phone].

## 2018-09-16 NOTE — Telephone Encounter (Signed)
Called patient and reminded her to complete Cologuard testing. She states she will complete.

## 2018-09-27 ENCOUNTER — Ambulatory Visit (INDEPENDENT_AMBULATORY_CARE_PROVIDER_SITE_OTHER): Payer: BC Managed Care – PPO | Admitting: Sports Medicine

## 2018-09-27 ENCOUNTER — Encounter: Payer: Self-pay | Admitting: Sports Medicine

## 2018-09-27 DIAGNOSIS — M180 Bilateral primary osteoarthritis of first carpometacarpal joints: Secondary | ICD-10-CM | POA: Diagnosis not present

## 2018-09-27 NOTE — Assessment & Plan Note (Signed)
Negative nerve conduction study, no improvement with bracing. CMC arthritis confirmed on x-rays. Persistent and severe pain, bilateral CMC injections today, return in 1 month.

## 2018-09-27 NOTE — Progress Notes (Signed)
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  I reviewed the past medical history, family history, social history, surgical history, and allergies today and no changes were needed.  Please see the problem list section below in epic for further details.  Past Medical History: Past Medical History:  Diagnosis Date  . Anxiety   . Chest pain 03/31/2012  . Depression   . H/O prolonged Q-T interval on ECG 03/31/2012  . Hypertension   . Major depressive disorder 03/31/2012  . Seasonal allergies    Past Surgical History: Past Surgical History:  Procedure Laterality Date  . CESAREAN SECTION  1998   Social History: Social History   Socioeconomic History  . Marital status: Married    Spouse name: Not on file  . Number of children: Not on file  . Years of education: Not on file  . Highest education level: Not on file  Occupational History  . Not on file  Social Needs  . Financial resource strain: Not on file  . Food insecurity:    Worry: Not on file    Inability: Not on file  . Transportation needs:    Medical: Not on file    Non-medical: Not on file  Tobacco Use  . Smoking status: Never Smoker  . Smokeless tobacco: Never Used  Substance and Sexual Activity  . Alcohol use: No  . Drug use: No  . Sexual activity: Yes    Partners: Male  Lifestyle  . Physical activity:    Days per week: Not on file    Minutes per session: Not on file  . Stress: Not on file  Relationships  . Social connections:    Talks on phone: Not on file    Gets together: Not on file    Attends religious service: Not on file    Active member of club or organization: Not on file    Attends meetings of clubs or organizations: Not on file    Relationship status: Not on file  Other  Topics Concern  . Not on file  Social History Narrative  . Not on file   Family History: Family History  Problem Relation Age of Onset  . Diabetes Mother   . Cancer Mother        Breast  . Depression Mother   . CAD Mother   . Heart failure Father   . Stroke Father   . Alcohol abuse Father   . Alcohol abuse Brother   . Drug abuse Brother    Allergies: Allergies  Allergen Reactions  . Cefuroxime Swelling  . Lac Bovis Nausea And Vomiting  . Peanut Oil Other (See Comments)  . Pork Allergy Nausea And Vomiting  . Doxycycline Nausea And Vomiting  . Lactose Intolerance (Gi)   . Peanut-Containing Drug Products   . Trazodone And Nefazodone     Prolonged QTC  . Codeine Rash  . Penicillins Rash  . Sulfonamide Derivatives Rash   Medications: See med rec.  Review of Systems: No fevers, chills, night sweats, weight loss, chest pain, or shortness of breath.   Objective:    General: Well Developed, well nourished, and in no acute distress.  Neuro: Alert and oriented x3, extra-ocular muscles intact, sensation grossly intact.  HEENT: Normocephalic, atraumatic, pupils equal round reactive to light,  neck supple, no masses, no lymphadenopathy, thyroid nonpalpable.  Skin: Warm and dry, no rashes. Cardiac: Regular rate and rhythm, no murmurs rubs or gallops, no lower extremity edema.  Respiratory: Clear to auscultation bilaterally. Not using accessory muscles, speaking in full sentences.  Procedure: Real-time Ultrasound Guided injection of the left first [facility] Device: GE Logiq E  Verbal informed consent obtained.  Time-out conducted.  Noted no overlying erythema, induration, or other signs of local infection.  Skin prepped in a sterile fashion.  Local anesthesia: Topical Ethyl chloride.  With sterile technique and under real time ultrasound guidance:  1/2 cc Kenalog 40, 1/2 cc lidocaine injected easily Completed without difficulty  Pain immediately resolved suggesting accurate  placement of the medication.  Advised to call if fevers/chills, erythema, induration, drainage, or persistent bleeding.  Images permanently stored and available for review in the ultrasound unit.  Impression: Technically successful ultrasound guided injection.  Procedure: Real-time Ultrasound Guided injection of the right first [facility] Device: GE Logiq E  Verbal informed consent obtained.  Time-out conducted.  Noted no overlying erythema, induration, or other signs of local infection.  Skin prepped in a sterile fashion.  Local anesthesia: Topical Ethyl chloride.  With sterile technique and under real time ultrasound guidance:  1/2 cc Kenalog 40, 1/2 cc lidocaine injected easily Completed without difficulty  Pain immediately resolved suggesting accurate placement of the medication.  Advised to call if fevers/chills, erythema, induration, drainage, or persistent bleeding.  Images permanently stored and available for review in the ultrasound unit.  Impression: Technically successful ultrasound guided injection.  Impression and Recommendations:    Primary osteoarthritis of both first carpometacarpal joints Negative nerve conduction study, no improvement with bracing. CMC arthritis confirmed on x-rays. Persistent and severe pain, bilateral CMC injections today, return in 1 month.   ___________________________________________ Ihor Austinhomas J. Benjamin Stainhekkekandam, M.D., ABFM., CAQSM. Primary Care and Sports Medicine [facility]  Adjunct Professor of Family Medicine  [employer]

## 2018-10-05 ENCOUNTER — Other Ambulatory Visit: Payer: Self-pay | Admitting: Physician Assistant

## 2018-10-05 DIAGNOSIS — Z01419 Encounter for gynecological examination (general) (routine) without abnormal findings: Secondary | ICD-10-CM | POA: Diagnosis not present

## 2018-10-05 DIAGNOSIS — Z6824 Body mass index (BMI) 24.0-24.9, adult: Secondary | ICD-10-CM | POA: Diagnosis not present

## 2018-10-05 DIAGNOSIS — M25531 Pain in right wrist: Secondary | ICD-10-CM

## 2018-10-05 DIAGNOSIS — M79641 Pain in right hand: Secondary | ICD-10-CM

## 2018-10-05 DIAGNOSIS — M79642 Pain in left hand: Secondary | ICD-10-CM

## 2018-10-05 DIAGNOSIS — R202 Paresthesia of skin: Secondary | ICD-10-CM

## 2018-10-05 DIAGNOSIS — M79644 Pain in right finger(s): Secondary | ICD-10-CM

## 2018-10-05 DIAGNOSIS — M79645 Pain in left finger(s): Secondary | ICD-10-CM

## 2018-10-05 DIAGNOSIS — M778 Other enthesopathies, not elsewhere classified: Secondary | ICD-10-CM

## 2018-10-05 DIAGNOSIS — R200 Anesthesia of skin: Secondary | ICD-10-CM

## 2018-10-17 ENCOUNTER — Other Ambulatory Visit: Payer: Self-pay | Admitting: Physician Assistant

## 2018-10-17 DIAGNOSIS — I10 Essential (primary) hypertension: Secondary | ICD-10-CM

## 2018-10-25 ENCOUNTER — Ambulatory Visit: Payer: BC Managed Care – PPO | Admitting: Sports Medicine

## 2018-10-25 DIAGNOSIS — F411 Generalized anxiety disorder: Secondary | ICD-10-CM | POA: Diagnosis not present

## 2018-11-04 DIAGNOSIS — F411 Generalized anxiety disorder: Secondary | ICD-10-CM | POA: Diagnosis not present

## 2018-11-04 DIAGNOSIS — F3342 Major depressive disorder, recurrent, in full remission: Secondary | ICD-10-CM | POA: Diagnosis not present

## 2018-11-24 DIAGNOSIS — F411 Generalized anxiety disorder: Secondary | ICD-10-CM | POA: Diagnosis not present

## 2018-12-17 ENCOUNTER — Encounter: Payer: Self-pay | Admitting: Family Medicine

## 2018-12-17 ENCOUNTER — Ambulatory Visit (INDEPENDENT_AMBULATORY_CARE_PROVIDER_SITE_OTHER): Payer: BC Managed Care – PPO | Admitting: Family Medicine

## 2018-12-17 ENCOUNTER — Other Ambulatory Visit: Payer: Self-pay | Admitting: Family Medicine

## 2018-12-17 ENCOUNTER — Other Ambulatory Visit: Payer: Self-pay

## 2018-12-17 VITALS — BP 159/94 | HR 76 | Temp 98.3°F | Ht 65.0 in | Wt 144.0 lb

## 2018-12-17 DIAGNOSIS — I10 Essential (primary) hypertension: Secondary | ICD-10-CM

## 2018-12-17 DIAGNOSIS — R300 Dysuria: Secondary | ICD-10-CM

## 2018-12-17 DIAGNOSIS — R8290 Unspecified abnormal findings in urine: Secondary | ICD-10-CM | POA: Diagnosis not present

## 2018-12-17 DIAGNOSIS — M25511 Pain in right shoulder: Secondary | ICD-10-CM

## 2018-12-17 DIAGNOSIS — M5412 Radiculopathy, cervical region: Secondary | ICD-10-CM | POA: Diagnosis not present

## 2018-12-17 LAB — POCT URINALYSIS DIPSTICK
Bilirubin, UA: NEGATIVE
Glucose, UA: NEGATIVE
Ketones, UA: NEGATIVE
Nitrite, UA: NEGATIVE
Protein, UA: NEGATIVE
Spec Grav, UA: 1.01 (ref 1.010–1.025)
Urobilinogen, UA: 0.2 E.U./dL
pH, UA: 6.5 (ref 5.0–8.0)

## 2018-12-17 MED ORDER — PREDNISONE 50 MG PO TABS: 50.0000 mg | ORAL_TABLET | Freq: Every day | ORAL | 0 refills | Status: DC

## 2018-12-17 MED ORDER — CIPROFLOXACIN HCL 500 MG PO TABS: 500.0000 mg | ORAL_TABLET | Freq: Two times a day (BID) | ORAL | 0 refills | Status: DC

## 2018-12-17 MED ORDER — GABAPENTIN 300 MG PO CAPS: ORAL_CAPSULE | ORAL | 3 refills | Status: DC

## 2018-12-17 MED ORDER — HYDROCHLOROTHIAZIDE 25 MG PO TABS: 25.0000 mg | ORAL_TABLET | Freq: Every day | ORAL | 0 refills | Status: DC

## 2018-12-17 NOTE — Patient Instructions (Addendum)
Thank you for coming in today. Take the 5 day course of prednisone.  Take gabapentin for nerve pain as needed.  OK to increase the dose of gabapentin faster than the bottle says.  Use the diclofenac gel on the painful part of the shoulder.  Keep me updated on how you are doing For UTI take cipro twice daily for 1 week.   Keep me updated.  If not better let me know.  We can and will do more.    Cervical Radiculopathy  Cervical radiculopathy means that a nerve in the neck (a cervical nerve) is pinched or bruised. This can happen because of an injury to the cervical spine (vertebrae) in the neck, or as a normal part of getting older. This can cause pain or loss of feeling (numbness) that runs from your neck all the way down to your arm and fingers. Often, this condition gets better with rest. Treatment may be needed if the condition does not get better. What are the causes?  A neck injury.  A bulging disk in your spine.  Muscle movements that you cannot control (muscle spasms).  Tight muscles in your neck due to overuse.  Arthritis.  Breakdown in the bones and joints of the spine (spondylosis) due to getting older.  Bone spurs that form near the nerves in the neck. What are the signs or symptoms?  Pain. The pain may: ? Run from the neck to the arm and hand. ? Be very bad or irritating. ? Be worse when you move your neck.  Loss of feeling or tingling in your arm or hand.  Weakness in your arm or hand, in very bad cases. How is this treated? In many cases, treatment is not needed for this condition. With rest, the condition often gets better over time. If treatment is needed, options may include:  Wearing a soft neck collar (cervical collar) for short periods of time, as told by your doctor.  Doing exercises (physical therapy) to strengthen your neck muscles.  Taking medicines.  Having shots (injections) in your spine, in very bad cases.  Having surgery. This may be  needed if other treatments do not help. The type of surgery that is used depends on the cause of your condition. Follow these instructions at home: If you have a soft neck collar:  Wear it as told by your doctor. Remove it only as told by your doctor.  Ask your doctor if you can remove the collar for cleaning and bathing. If you are allowed to remove the collar for cleaning or bathing: ? Follow instructions from your doctor about how to remove the collar safely. ? Clean the collar by wiping it with mild soap and water and drying it completely. ? Take out any removable pads in the collar every 1-2 days. Wash them by hand with soap and water. Let them air-dry completely before you put them back in the collar. ? Check your skin under the collar for redness or sores. If you see any, tell your doctor. Managing pain      Take over-the-counter and prescription medicines only as told by your doctor.  If told, put ice on the painful area. ? If you have a soft neck collar, remove it as told by your doctor. ? Put ice in a plastic bag. ? Place a towel between your skin and the bag. ? Leave the ice on for 20 minutes, 2-3 times a day.  If using ice does not help, you can  try using heat. Use the heat source that your doctor recommends, such as a moist heat pack or a heating pad. ? Place a towel between your skin and the heat source. ? Leave the heat on for 20-30 minutes. ? Remove the heat if your skin turns bright red. This is very important if you are unable to feel pain, heat, or cold. You may have a greater risk of getting burned.  You may try a gentle neck and shoulder rub (massage). Activity  Rest as needed.  Return to your normal activities as told by your doctor. Ask your doctor what activities are safe for you.  Do exercises as told by your doctor or physical therapist.  Do not lift anything that is heavier than 10 lb (4.5 kg) until your doctor tells you that it is safe. General  instructions  Use a flat pillow when you sleep.  Do not drive while wearing a soft neck collar. If you do not have a soft neck collar, ask your doctor if it is safe to drive while your neck heals.  Ask your doctor if the medicine prescribed to you requires you to avoid driving or using heavy machinery.  Do not use any products that contain nicotine or tobacco, such as cigarettes, e-cigarettes, and chewing tobacco. These can delay healing. If you need help quitting, ask your doctor.  Keep all follow-up visits as told by your doctor. This is important. Contact a doctor if:  Your condition does not get better with treatment. Get help right away if:  Your pain gets worse and is not helped with medicine.  You lose feeling or feel weak in your hand, arm, face, or leg.  You have a high fever.  You have a stiff neck.  You cannot control when you poop or pee (have incontinence).  You have trouble with walking, balance, or talking. Summary  Cervical radiculopathy means that a nerve in the neck is pinched or bruised.  A nerve can get pinched from a bulging disk, arthritis, an injury to the neck, or other causes.  Symptoms include pain, tingling, or loss of feeling that goes from the neck into the arm or hand.  Weakness in your arm or hand can happen in very bad cases.  Treatment may include resting, wearing a soft neck collar, and doing exercises. You might need to take medicines for pain. In very bad cases, shots or surgery may be needed. This information is not intended to replace advice given to you by your health care provider. Make sure you discuss any questions you have with your health care provider. Document Released: 03/27/2011 Document Revised: 02/26/2018 Document Reviewed: 02/26/2018 Elsevier Patient Education  2020 [employer].

## 2018-12-17 NOTE — Progress Notes (Signed)
Darlene NicksSusan Ochoa is a 53 y.o. female who [facility] today for right shoulder pain.  Patient has had pain at the superior aspect of the shoulder radiating down her arm to the thumb present off and on over the last 3 weeks worsening recently.  She notes that she has pain sometimes with shoulder motion but sometimes not with shoulder motion.  Sometimes it feels better to put her hand over her head.  She denies any injury.  She is tried Tylenol which did not help very much.  She also tried Voltaren gel this morning which she is not sure if it helped or not yet.  She denies any significant neck pain fevers chills nausea vomiting or diarrhea.  She does however note that for about a week now she is had urinary urgency and frequency.  She notes some pain with urination.  She thinks she has a UTI.  Also she notes that her blood pressure has been elevated.  She is been out of her blood pressure medicine for some time now.  Her PCP refilled her blood pressure medicine this morning but she has not had a chance to pick it up.    ROS:  As above  Exam:    Exam:  BP (!) 159/94   Pulse 76   Temp 98.3 F (36.8 C) (Oral)   Ht 5\' 5"  (1.651 m)   Wt 144 lb (65.3 kg)   BMI 23.96 kg/m  Wt Readings from Last 5 Encounters:  12/17/18 144 lb (65.3 kg)  09/27/18 148 lb (67.1 kg)  05/28/18 148 lb (67.1 kg)  04/05/18 153 lb (69.4 kg)  08/17/17 160 lb (72.6 kg)    Gen: Well NAD HEENT: EOMI,  MMM Lungs: Normal work of breathing. CTABL Heart: RRR no MRG Abd: NABS, Soft. Nondistended, Nontender no CVA angle tenderness to percussion Exts: Brisk capillary refill, warm and well perfused.  C-spine: Nontender to spinal midline normal cervical motion. Upper extremity strength is equal and intact throughout bilateral upper extremities. Sensation is intact throughout.  Right shoulder: Normal-appearing tender palpation AC joint.  Normal shoulder motion. Intact  strength. Positive Hawkins and Neer's test. Negative empty can test. Positive crossover arm compression test. Negative Yergason's and speeds test.    Lab and Radiology Results Results for orders placed or performed in visit on 12/17/18 (from the past 72 hour(s))  POCT urinalysis dipstick     Status: Abnormal   Collection Time: 12/17/18 10:23 AM  Result Value Ref Range   Color, UA Light Yellow    Clarity, UA Cloudy    Glucose, UA Negative Negative   Bilirubin, UA Negative    Ketones, UA Negative    Spec Grav, UA 1.010 1.010 - 1.025   Blood, UA Moderate    pH, UA 6.5 5.0 - 8.0   Protein, UA Negative Negative   Urobilinogen, UA 0.2 0.2 or 1.0 E.U./dL   Nitrite, UA Negative    Leukocytes, UA Large (3+) (A) Negative   Appearance Light Yellow    Odor Odorless    No results found.  EXAM: CERVICAL SPINE - COMPLETE 4+ VIEW  COMPARISON:  None  FINDINGS: Prevertebral soft tissues normal thickness.  Probable mild osseous demineralization.  Vertebral body and disc space heights maintained.  Bony foramina grossly patent, though small uncovertebral spurs are seen at LEFT C5-C6 neural foramen.  No acute fracture, subluxation or bone destruction.  Lung apices clear.  C1-C2 alignment normal.  IMPRESSION: Mild uncovertebral  spur formation at LEFT C5-C6 neural foramen.  Otherwise negative exam.   Electronically Signed   By: Lavonia Dana M.D.   On: 08/21/2016 09:19  I personally (independently) visualized and performed the interpretation of the images attached in this note.   Assessment and Plan: 53 y.o. female with right shoulder pain.  Likely multifactorial.  Patient has tenderness to palpation at [facility] joint and some exam and overs significant for either [facility] joint related pain or possibly subacromial bursitis related pain.  However she also clearly has some cervical radicular symptoms likely at the C6 nerve root.  X-ray 2 years ago did show some changes at this  level.  We discussed options.  She like to avoid injection if possible.  Plan to treat with trial of prednisone and gabapentin and home exercise program additionally recommend using diclofenac gel on AC joint more consistently.  Recheck if not improving.  Patient also has urinary tract infection today.  Culture pending.  Treated P empirically with Cipro.  She is allergic to several other antibiotics but think she can tolerate Cipro.  Hypertension: Blood pressure elevated today.  Plan to restart blood pressure medication prescribed by PCP.   PDMP not reviewed this encounter. Orders Placed This Encounter  Procedures  . Urine Culture    Standing Status:   Future    Number of Occurrences:   1    Standing Expiration Date:   12/17/2019  . Urinalysis, microscopic only  . POCT urinalysis dipstick   Meds ordered this encounter  Medications  . hydrochlorothiazide (HYDRODIURIL) 25 MG tablet    Sig: Take 1 tablet (25 mg total) by mouth daily.    Dispense:  30 tablet    Refill:  0  . predniSONE (DELTASONE) 50 MG tablet    Sig: Take 1 tablet (50 mg total) by mouth daily.    Dispense:  5 tablet    Refill:  0  . gabapentin (NEURONTIN) 300 MG capsule    Sig: One tab PO qHS for a week, then BID for a week, then TID. May double weekly to a max of 3,600mg /day    Dispense:  180 capsule    Refill:  3  . ciprofloxacin (CIPRO) 500 MG tablet    Sig: Take 1 tablet (500 mg total) by mouth 2 (two) times daily.    Dispense:  14 tablet    Refill:  0    Historical information moved to improve visibility of documentation.  Past Medical History:  Diagnosis Date  . Anxiety   . Chest pain 03/31/2012  . Depression   . H/O prolonged Q-T interval on ECG 03/31/2012  . Hypertension   . Major depressive disorder 03/31/2012  . Seasonal allergies    Past Surgical History:  Procedure Laterality Date  . CESAREAN SECTION  1998   Social History   Tobacco Use  . Smoking status: Never Smoker  . Smokeless  tobacco: Never Used  Substance Use Topics  . Alcohol use: No   family history includes Alcohol abuse in her brother and father; CAD in her mother; Cancer in her mother; Depression in her mother; Diabetes in her mother; Drug abuse in her brother; Heart failure in her father; Stroke in her father.  Medications: Current Outpatient Medications  Medication Sig Dispense Refill  . azelastine (ASTELIN) 0.1 % nasal spray PLACE 2 SPRAYS INTO BOTH NOSTRILS 2 (TWO) TIMES DAILY. USE IN EACH NOSTRIL AS DIRECTED 30 mL 1  . azelastine (OPTIVAR) 0.05 % ophthalmic solution Place 2  drops into both eyes 2 (two) times daily. 6 mL 11  . buPROPion (WELLBUTRIN XL) 150 MG 24 hr tablet Take 150 mg by mouth daily.    Marland Kitchen buPROPion (WELLBUTRIN XL) 300 MG 24 hr tablet Take 300 mg by mouth daily.    . Cholecalciferol (VITAMIN D3) 50000 units CAPS Vitamin D2 50,000 unit capsule    . diclofenac (VOLTAREN) 75 MG EC tablet TAKE 1 TABLET BY MOUTH TWICE A DAY 60 tablet 2  . diclofenac sodium (VOLTAREN) 1 % GEL APPLY 4 G TOPICALLY 4 (FOUR) TIMES DAILY. TO AFFECTED JOINT. 100 g 1  . ESTARYLLA 0.25-35 MG-MCG tablet     . fexofenadine (ALLEGRA) 180 MG tablet Take 1 tablet (180 mg total) by mouth daily. 90 tablet 3  . gabapentin (NEURONTIN) 300 MG capsule One tab PO qHS for a week, then BID for a week, then TID. May double weekly to a max of 3,600mg /day 180 capsule 3  . hydrochlorothiazide (HYDRODIURIL) 25 MG tablet Take 1 tablet (25 mg total) by mouth daily. 30 tablet 0  . predniSONE (DELTASONE) 50 MG tablet Take 1 tablet (50 mg total) by mouth daily. 5 tablet 0  . propranolol (INDERAL) 40 MG tablet propranolol 40 mg tablet  TAKE 1 TABLET (40 MG TOTAL) BY MOUTH 2 (TWO) TIMES DAILY.    . SUMAtriptan (IMITREX) 50 MG tablet Take 1 tablet (50 mg total) by mouth every 2 (two) hours as needed for migraine. May repeat in 2 hours if headache persists or recurs. 12 tablet 0  . ciprofloxacin (CIPRO) 500 MG tablet Take 1 tablet (500 mg total)  by mouth 2 (two) times daily. 14 tablet 0   No current facility-administered medications for this visit.    Allergies  Allergen Reactions  . Cefuroxime Swelling  . Lac Bovis Nausea And Vomiting  . Peanut Oil Other (See Comments)  . Pork Allergy Nausea And Vomiting  . Doxycycline Nausea And Vomiting  . Lactose Intolerance (Gi)   . Peanut-Containing Drug Products   . Trazodone And Nefazodone     Prolonged QTC  . Codeine Rash  . Penicillins Rash  . Sulfonamide Derivatives Rash      Discussed warning signs or symptoms. Please see discharge instructions. Patient expresses understanding.

## 2018-12-18 LAB — URINALYSIS, MICROSCOPIC ONLY
Bacteria, UA: NONE SEEN /HPF
Hyaline Cast: NONE SEEN /LPF
WBC, UA: >=60 /HPF — AB (ref 0–5)

## 2018-12-28 ENCOUNTER — Ambulatory Visit: Payer: BC Managed Care – PPO | Admitting: Physician Assistant

## 2019-01-03 DIAGNOSIS — F411 Generalized anxiety disorder: Secondary | ICD-10-CM | POA: Diagnosis not present

## 2019-01-08 ENCOUNTER — Other Ambulatory Visit: Payer: Self-pay | Admitting: Physician Assistant

## 2019-01-08 DIAGNOSIS — I10 Essential (primary) hypertension: Secondary | ICD-10-CM

## 2019-01-18 ENCOUNTER — Encounter: Payer: Self-pay | Admitting: Family Medicine

## 2019-01-18 ENCOUNTER — Other Ambulatory Visit: Payer: Self-pay

## 2019-01-18 ENCOUNTER — Ambulatory Visit (INDEPENDENT_AMBULATORY_CARE_PROVIDER_SITE_OTHER): Payer: BC Managed Care – PPO | Admitting: Family Medicine

## 2019-01-18 VITALS — BP 135/88 | HR 71 | Temp 98.3°F | Wt 144.0 lb

## 2019-01-18 DIAGNOSIS — M25511 Pain in right shoulder: Secondary | ICD-10-CM

## 2019-01-18 NOTE — Patient Instructions (Signed)
Thank you for coming in today. Call or go to the ER if you develop a large red swollen joint with extreme pain or oozing puss.  Let me know if not better.  Work on home exercises.  Use gabapentin for symptoms below the elbow.    Shoulder Impingement Syndrome Rehab Ask your health care provider which exercises are safe for you. Do exercises exactly as told by your health care provider and adjust them as directed. It is normal to feel mild stretching, pulling, tightness, or discomfort as you do these exercises. Stop right away if you feel sudden pain or your pain gets worse. Do not begin these exercises until told by your health care provider. Stretching and range-of-motion exercise This exercise warms up your muscles and joints and improves the movement and flexibility of your shoulder. This exercise also helps to relieve pain and stiffness. Passive horizontal adduction In passive adduction, you use your other hand to move the injured arm toward your body. The injured arm does not move on its own. In this movement, your arm is moved across your body in the horizontal plane (horizontal adduction). 1. Sit or stand and pull your left / right elbow across your chest, toward your other shoulder. Stop when you feel a gentle stretch in the back of your shoulder and upper arm. ? Keep your arm at shoulder height. ? Keep your arm as close to your body as you comfortably can. 2. Hold for __________ seconds. 3. Slowly return to the starting position. Repeat __________ times. Complete this exercise __________ times a day. Strengthening exercises These exercises build strength and endurance in your shoulder. Endurance is the ability to use your muscles for a long time, even after they get tired. External rotation, isometric This is an exercise in which you press the back of your wrist against a door frame without moving your shoulder joint (isometric). 1. Stand or sit in a doorway, facing the door frame. 2.  Bend your left / right elbow and place the back of your wrist against the door frame. Only the back of your wrist should be touching the frame. Keep your upper arm at your side. 3. Gently press your wrist against the door frame, as if you are trying to push your arm away from your abdomen (external rotation). Press as hard as you are able without pain. ? Avoid shrugging your shoulder while you press your wrist against the door frame. Keep your shoulder blade tucked down toward the middle of your back. 4. Hold for __________ seconds. 5. Slowly release the tension, and relax your muscles completely before you repeat the exercise. Repeat __________ times. Complete this exercise __________ times a day. Internal rotation, isometric This is an exercise in which you press your palm against a door frame without moving your shoulder joint (isometric). 1. Stand or sit in a doorway, facing the door frame. 2. Bend your left / right elbow and place the palm of your hand against the door frame. Only your palm should be touching the frame. Keep your upper arm at your side. 3. Gently press your hand against the door frame, as if you are trying to push your arm toward your abdomen (internal rotation). Press as hard as you are able without pain. ? Avoid shrugging your shoulder while you press your hand against the door frame. Keep your shoulder blade tucked down toward the middle of your back. 4. Hold for __________ seconds. 5. Slowly release the tension, and relax your muscles  completely before you repeat the exercise. Repeat __________ times. Complete this exercise __________ times a day. Scapular protraction, supine  1. Lie on your back on a firm surface (supine position). Hold a __________ weight in your left / right hand. 2. Raise your left / right arm straight into the air so your hand is directly above your shoulder joint. 3. Push the weight into the air so your shoulder (scapula) lifts off the surface that  you are lying on. The scapula will push up or forward (protraction). Do not move your head, neck, or back. 4. Hold for __________ seconds. 5. Slowly return to the starting position. Let your muscles relax completely before you repeat this exercise. Repeat __________ times. Complete this exercise __________ times a day. Scapular retraction  1. Sit in a stable chair without armrests, or stand up. 2. Secure an exercise band to a stable object in front of you so the band is at shoulder height. 3. Hold one end of the exercise band in each hand. Your palms should face down. 4. Squeeze your shoulder blades together (retraction) and move your elbows slightly behind you. Do not shrug your shoulders upward while you do this. 5. Hold for __________ seconds. 6. Slowly return to the starting position. Repeat __________ times. Complete this exercise __________ times a day. Shoulder extension  1. Sit in a stable chair without armrests, or stand up. 2. Secure an exercise band to a stable object in front of you so the band is above shoulder height. 3. Hold one end of the exercise band in each hand. 4. Straighten your elbows and lift your hands up to shoulder height. 5. Squeeze your shoulder blades together and pull your hands down to the sides of your thighs (extension). Stop when your hands are straight down by your sides. Do not let your hands go behind your body. 6. Hold for __________ seconds. 7. Slowly return to the starting position. Repeat __________ times. Complete this exercise __________ times a day. This information is not intended to replace advice given to you by your health care provider. Make sure you discuss any questions you have with your health care provider. Document Released: 04/07/2005 Document Revised: 07/30/2018 Document Reviewed: 05/03/2018 Elsevier Patient Education  2020 [employer].

## 2019-01-18 NOTE — Progress Notes (Signed)
Darlene Ochoa is a 53 y.o. female who presents to [facility] today for follow-up right shoulder pain.  Patient was seen about a month ago for right shoulder pain thought to be multifactorial.  Pain was thought primarily to be [facility] joint or subacromial bursa related as well as complicated by cervical radicular symptoms likely at C6 nerve root.  She wanted to avoid injection during the visit and proceeded with prednisone gabapentin for exercise program and diclofenac gel.  She notes that she is continued to have pain mostly at the anterior lateral upper arm and shoulder.  Pain is worse with overhead reach and reaching back.  She has some occasional symptoms cleaning numbness and tingling into her hand but that is not very bothersome.  She notes gabapentin has not been very helpful.    ROS:  As above  Exam:  BP 135/88   Pulse 71   Temp 98.3 F (36.8 C) (Oral)   Wt 144 lb (65.3 kg)   BMI 23.96 kg/m  Wt Readings from Last 5 Encounters:  01/18/19 144 lb (65.3 kg)  12/17/18 144 lb (65.3 kg)  09/27/18 148 lb (67.1 kg)  05/28/18 148 lb (67.1 kg)  04/05/18 153 lb (69.4 kg)   General: Well Developed, well nourished, and in no acute distress.  Neuro/Psych: Alert and oriented x3, extra-ocular muscles intact, able to move all 4 extremities, sensation grossly intact. Skin: Warm and dry, no rashes noted.  Respiratory: Not using accessory muscles, speaking in full sentences, trachea midline.  Cardiovascular: Pulses palpable, no extremity edema. Abdomen: Does not appear distended. MSK: Right shoulder: Normal-appearing Tender palpation AC joint. Normal range of motion pain with abduction and internal rotation. Positive Hawkins and Neer's test positive pecan test. Intact strength.  Negative Yergason's and speeds test.    Lab and Radiology Results Procedure: Real-time Ultrasound Guided Injection of right subacromial bursa Device: GE Logiq E   Images  permanently stored and available for review in the ultrasound unit. Verbal informed consent obtained.  Discussed risks and benefits of procedure. Warned about infection bleeding damage to structures skin hypopigmentation and fat atrophy among others. Patient expresses understanding and agreement Time-out conducted.   Noted no overlying erythema, induration, or other signs of local infection.   Skin prepped in a sterile fashion.   Local anesthesia: Topical Ethyl chloride.   With sterile technique and under real time ultrasound guidance:  40 mg of Kenalog and 2 mL of Marcaine injected easily.   Completed without difficulty   Pain immediately partial resolved suggesting accurate placement of the medication.   Advised to call if fevers/chills, erythema, induration, drainage, or persistent bleeding.   Images permanently stored and available for review in the ultrasound unit.  Impression: Technically successful ultrasound guided injection.         Assessment and Plan: 53 y.o. female with right shoulder pain likely subacromial bursitis.  May be component of AC DJD as well.  Patient did have very good pain improvement response following injection but still had some pain around [facility] joint. Plan to proceed with home exercises and injection as above.  Recheck if not improving.  Next up would likely be x-ray and MRI.  May be role for formal physical therapy here as well.   PDMP not reviewed this encounter. No orders of the defined types were placed in this encounter.  No orders of the defined types were placed in this encounter.   Historical information moved to improve visibility of  documentation.  Past Medical History:  Diagnosis Date  . Anxiety   . Chest pain 03/31/2012  . Depression   . H/O prolonged Q-T interval on ECG 03/31/2012  . Hypertension   . Major depressive disorder 03/31/2012  . Seasonal allergies    Past Surgical History:  Procedure Laterality Date  . CESAREAN SECTION   1998   Social History   Tobacco Use  . Smoking status: Never Smoker  . Smokeless tobacco: Never Used  Substance Use Topics  . Alcohol use: No   family history includes Alcohol abuse in her brother and father; CAD in her mother; Cancer in her mother; Depression in her mother; Diabetes in her mother; Drug abuse in her brother; Heart failure in her father; Stroke in her father.  Medications: Current Outpatient Medications  Medication Sig Dispense Refill  . azelastine (ASTELIN) 0.1 % nasal spray PLACE 2 SPRAYS INTO BOTH NOSTRILS 2 (TWO) TIMES DAILY. USE IN EACH NOSTRIL AS DIRECTED 30 mL 1  . azelastine (OPTIVAR) 0.05 % ophthalmic solution Place 2 drops into both eyes 2 (two) times daily. 6 mL 11  . buPROPion (WELLBUTRIN XL) 150 MG 24 hr tablet Take 150 mg by mouth daily.    Marland Kitchen buPROPion (WELLBUTRIN XL) 300 MG 24 hr tablet Take 300 mg by mouth daily.    . Cholecalciferol (VITAMIN D3) 50000 units CAPS Vitamin D2 50,000 unit capsule    . ciprofloxacin (CIPRO) 500 MG tablet Take 1 tablet (500 mg total) by mouth 2 (two) times daily. 14 tablet 0  . diclofenac (VOLTAREN) 75 MG EC tablet TAKE 1 TABLET BY MOUTH TWICE A DAY 60 tablet 2  . diclofenac sodium (VOLTAREN) 1 % GEL APPLY 4 G TOPICALLY 4 (FOUR) TIMES DAILY. TO AFFECTED JOINT. 100 g 1  . ESTARYLLA 0.25-35 MG-MCG tablet     . fexofenadine (ALLEGRA) 180 MG tablet Take 1 tablet (180 mg total) by mouth daily. 90 tablet 3  . gabapentin (NEURONTIN) 300 MG capsule One tab PO qHS for a week, then BID for a week, then TID. May double weekly to a max of 3,600mg /day 180 capsule 3  . hydrochlorothiazide (HYDRODIURIL) 25 MG tablet Take 1 tablet (25 mg total) by mouth daily. NEEDS LABS 30 tablet 0  . predniSONE (DELTASONE) 50 MG tablet Take 1 tablet (50 mg total) by mouth daily. 5 tablet 0  . propranolol (INDERAL) 40 MG tablet propranolol 40 mg tablet  TAKE 1 TABLET (40 MG TOTAL) BY MOUTH 2 (TWO) TIMES DAILY.    . SUMAtriptan (IMITREX) 50 MG tablet Take 1  tablet (50 mg total) by mouth every 2 (two) hours as needed for migraine. May repeat in 2 hours if headache persists or recurs. 12 tablet 0   No current facility-administered medications for this visit.    Allergies  Allergen Reactions  . Cefuroxime Swelling  . Lac Bovis Nausea And Vomiting  . Peanut Oil Other (See Comments)  . Pork Allergy Nausea And Vomiting  . Doxycycline Nausea And Vomiting  . Lactose Intolerance (Gi)   . Peanut-Containing Drug Products   . Trazodone And Nefazodone     Prolonged QTC  . Codeine Rash  . Penicillins Rash  . Sulfonamide Derivatives Rash      Discussed warning signs or symptoms. Please see discharge instructions. Patient expresses understanding.

## 2019-01-19 LAB — URINE CULTURE
MICRO NUMBER:: 826034
SPECIMEN QUALITY:: ADEQUATE

## 2019-01-19 LAB — HOUSE ACCOUNT TRACKING

## 2019-01-31 DIAGNOSIS — F411 Generalized anxiety disorder: Secondary | ICD-10-CM | POA: Diagnosis not present

## 2019-02-08 ENCOUNTER — Other Ambulatory Visit: Payer: Self-pay | Admitting: Physician Assistant

## 2019-02-08 DIAGNOSIS — I10 Essential (primary) hypertension: Secondary | ICD-10-CM

## 2019-02-22 ENCOUNTER — Other Ambulatory Visit: Payer: Self-pay | Admitting: Physician Assistant

## 2019-02-22 DIAGNOSIS — I10 Essential (primary) hypertension: Secondary | ICD-10-CM

## 2019-03-01 DIAGNOSIS — F411 Generalized anxiety disorder: Secondary | ICD-10-CM | POA: Diagnosis not present

## 2019-03-11 ENCOUNTER — Telehealth: Payer: Self-pay

## 2019-03-11 ENCOUNTER — Other Ambulatory Visit: Payer: Self-pay | Admitting: Physician Assistant

## 2019-03-11 DIAGNOSIS — R200 Anesthesia of skin: Secondary | ICD-10-CM

## 2019-03-11 DIAGNOSIS — M778 Other enthesopathies, not elsewhere classified: Secondary | ICD-10-CM

## 2019-03-11 DIAGNOSIS — I10 Essential (primary) hypertension: Secondary | ICD-10-CM

## 2019-03-11 DIAGNOSIS — M25531 Pain in right wrist: Secondary | ICD-10-CM

## 2019-03-11 DIAGNOSIS — R202 Paresthesia of skin: Secondary | ICD-10-CM

## 2019-03-11 DIAGNOSIS — M79644 Pain in right finger(s): Secondary | ICD-10-CM

## 2019-03-11 DIAGNOSIS — M79641 Pain in right hand: Secondary | ICD-10-CM

## 2019-03-11 MED ORDER — DICLOFENAC SODIUM 75 MG PO TBEC: 75.0000 mg | DELAYED_RELEASE_TABLET | Freq: Two times a day (BID) | ORAL | 2 refills | Status: DC

## 2019-03-11 MED ORDER — HYDROCHLOROTHIAZIDE 25 MG PO TABS: 25.0000 mg | ORAL_TABLET | Freq: Every day | ORAL | 0 refills | Status: DC

## 2019-03-11 NOTE — Addendum Note (Signed)
Addended by: Dessie Coma on: 03/11/2019 09:26 AM   Modules accepted: Orders

## 2019-03-11 NOTE — Telephone Encounter (Signed)
Copied from [place] [id number]. Topic: General - Other >> Mar 11, 2019  9:43 AM Yvette Rack wrote: Reason for CRM: Pt would like to have the injection for her wrist and would like to know if Dr. Georgina Snell feels it is time for the next injection. Pt requests call back. Cb# [phone]

## 2019-03-14 ENCOUNTER — Other Ambulatory Visit: Payer: Self-pay

## 2019-03-14 ENCOUNTER — Encounter: Payer: Self-pay | Admitting: Physician Assistant

## 2019-03-14 ENCOUNTER — Ambulatory Visit (INDEPENDENT_AMBULATORY_CARE_PROVIDER_SITE_OTHER): Payer: BC Managed Care – PPO | Admitting: Physician Assistant

## 2019-03-14 VITALS — BP 125/77 | HR 87 | Ht 65.0 in | Wt 141.0 lb

## 2019-03-14 DIAGNOSIS — Z79899 Other long term (current) drug therapy: Secondary | ICD-10-CM

## 2019-03-14 DIAGNOSIS — I10 Essential (primary) hypertension: Secondary | ICD-10-CM

## 2019-03-14 MED ORDER — HYDROCHLOROTHIAZIDE 25 MG PO TABS: 25.0000 mg | ORAL_TABLET | Freq: Every day | ORAL | 3 refills | Status: DC

## 2019-03-14 NOTE — Progress Notes (Signed)
Subjective:    Patient ID: Darlene Ochoa, female    DOB: March 19, 1966, 53 y.o.   MRN: [id number]  HPI  Patient is a 53 year old female with primary bilateral OA of CMC joint and hypertension.  Patient starting to have significant arthritis pain again.  She is followed by Dr. Dianah Field.  She wears bilateral braces.  She uses diclofenac daily.  She uses the Voltaren gel as needed.  Patient denies any chest pain, palpitation, headache, vision changes.  She has taken HCTZ daily with no concerns or complaints.  She does not check her blood pressure at home.  .. Active Ambulatory Problems    Diagnosis Date Noted  . PALPITATIONS 08/02/2009  . Major depressive disorder 03/31/2012  . Chest pain 03/31/2012  . Shortness of breath 03/31/2012  . Migraine 03/31/2012  . Anxiety 04/27/2012  . Essential hypertension 04/27/2012  . Major depressive disorder, recurrent episode, moderate ([place]) 12/27/2012  . Generalized anxiety disorder 12/27/2012  . Prolonged Q-T interval on ECG 10/06/2013  . Vitamin D deficiency 08/01/2014  . B12 deficiency 08/01/2014  . Trapezius muscle spasm 08/21/2016  . Acute pain of left shoulder 08/21/2016  . 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 Seasonal allergic rhinitis 07/09/2017  . Post-nasal drip 07/09/2017  . Environmental allergies 07/21/2017  . Seasonal allergies 07/21/2017  . Angio-edema 08/10/2017  . Eustachian tube dysfunction, bilateral 08/17/2017  . Tendonitis of both wrists 06/01/2018  . Bilateral hand pain 06/01/2018  . Bilateral thumb pain 06/01/2018  . Bilateral wrist pain 06/01/2018  . Numbness and tingling in both hands 06/01/2018  . Primary osteoarthritis of both first carpometacarpal joints 07/29/2018   Resolved Ambulatory Problems    Diagnosis Date Noted  . No Resolved Ambulatory Problems   Past Medical History:  Diagnosis Date  . Depression   . H/O prolonged Q-T interval on ECG 03/31/2012  . Hypertension      Review of Systems  All other  systems reviewed and are negative.      Objective:   Physical Exam Vitals signs reviewed.  Constitutional:      Appearance: Normal appearance.  HENT:     Head: Normocephalic.  Cardiovascular:     Rate and Rhythm: Normal rate and regular rhythm.     Pulses: Normal pulses.     Heart sounds: No murmur.  Pulmonary:     Effort: Pulmonary effort is normal.     Breath sounds: Normal breath sounds.  Neurological:     General: No focal deficit present.     Mental Status: She is alert and oriented to person, place, and time.  Psychiatric:        Mood and Affect: Mood normal.           Assessment & Plan:  Marland KitchenMarland KitchenSharin was seen today for hypertension.  Diagnoses and all orders for this visit:  Essential hypertension -     hydrochlorothiazide (HYDRODIURIL) 25 MG tablet; Take 1 tablet (25 mg total) by mouth daily. -     Comprehensive metabolic panel  Medication management -     Comprehensive metabolic panel  Blood pressure looks great today.  Refilled HCTZ for 1 year.  Needs labs.  Printed CMP.  Patient had labs from psychiatry where lipid panel was ordered along with A1c.  I would like those results to be given to me when she gets them in.  Patient declined flu shot today.  Patient has Cologuard kit at home but she has not returned.  Strongly encouraged her to do colonoscopy screening.  Follow up in 1 year.

## 2019-03-15 DIAGNOSIS — Z79899 Other long term (current) drug therapy: Secondary | ICD-10-CM | POA: Diagnosis not present

## 2019-03-15 DIAGNOSIS — R739 Hyperglycemia, unspecified: Secondary | ICD-10-CM | POA: Diagnosis not present

## 2019-03-15 DIAGNOSIS — F331 Major depressive disorder, recurrent, moderate: Secondary | ICD-10-CM | POA: Diagnosis not present

## 2019-03-16 LAB — COMPREHENSIVE METABOLIC PANEL
ALT: 19 IU/L (ref 0–32)
AST: 18 IU/L (ref 0–40)
Albumin/Globulin Ratio: 2 (ref 1.2–2.2)
Albumin: 4.4 g/dL (ref 3.8–4.9)
Alkaline Phosphatase: 104 IU/L (ref 39–117)
BUN/Creatinine Ratio: 20 (ref 9–23)
BUN: 18 mg/dL (ref 6–24)
Bilirubin Total: 0.3 mg/dL (ref 0.0–1.2)
CO2: 23 mmol/L (ref 20–29)
Calcium: 9.6 mg/dL (ref 8.7–10.2)
Chloride: 105 mmol/L (ref 96–106)
Creatinine, Ser: 0.91 mg/dL (ref 0.57–1.00)
GFR calc Af Amer: 83 mL/min/{1.73_m2} (ref >59)
GFR calc non Af Amer: 72 mL/min/{1.73_m2} (ref >59)
Globulin, Total: 2.2 g/dL (ref 1.5–4.5)
Glucose: 101 mg/dL — ABNORMAL HIGH (ref 65–99)
Potassium: 4.4 mmol/L (ref 3.5–5.2)
Sodium: 143 mmol/L (ref 134–144)
Total Protein: 6.6 g/dL (ref 6.0–8.5)

## 2019-03-16 NOTE — Progress Notes (Signed)
Normal labs.

## 2019-03-21 NOTE — Telephone Encounter (Signed)
Called pt and she states that she will call back to schedule an appt for her wrist/thumb.

## 2019-03-22 ENCOUNTER — Telehealth: Payer: Self-pay | Admitting: Physician Assistant

## 2019-03-22 NOTE — Telephone Encounter (Signed)
Patient aware labs normal 

## 2019-03-22 NOTE — Telephone Encounter (Signed)
Received labs from [facility]. Looks good. No concerns.

## 2019-03-23 ENCOUNTER — Encounter: Payer: Self-pay | Admitting: Physician Assistant

## 2019-03-28 ENCOUNTER — Ambulatory Visit (INDEPENDENT_AMBULATORY_CARE_PROVIDER_SITE_OTHER): Payer: BC Managed Care – PPO | Admitting: Sports Medicine

## 2019-03-28 ENCOUNTER — Other Ambulatory Visit: Payer: Self-pay

## 2019-03-28 DIAGNOSIS — M180 Bilateral primary osteoarthritis of first carpometacarpal joints: Secondary | ICD-10-CM

## 2019-03-28 DIAGNOSIS — M7541 Impingement syndrome of right shoulder: Secondary | ICD-10-CM | POA: Insufficient documentation

## 2019-03-28 NOTE — Assessment & Plan Note (Signed)
Rotator cuff rehab given, subacromial injection today, return to see me in 1 month, MRI if no better.

## 2019-03-28 NOTE — Progress Notes (Signed)
Subjective:    CC: Shoulder pain, hand pain  HPI: This is a pleasant 53 year old female with right shoulder pain, last injected almost 3 months ago, localized over the deltoid and worse with overhead activities, desires repeat interventional treatment today.  Also has bilateral first CMC osteoarthritis, last injected approximately 6 months ago, now with recurrence of pain, moderate, persistent, localized without radiation.  I reviewed the past medical history, family history, social history, surgical history, and allergies today and no changes were needed.  Please see the problem list section below in epic for further details.  Past Medical History: Past Medical History:  Diagnosis Date  . Anxiety   . Chest pain 03/31/2012  . Depression   . H/O prolonged Q-T interval on ECG 03/31/2012  . Hypertension   . Major depressive disorder 03/31/2012  . Seasonal allergies    Past Surgical History: Past Surgical History:  Procedure Laterality Date  . CESAREAN SECTION  1998   Social History: Social History   Socioeconomic History  . Marital status: Married    Spouse name: Not on file  . Number of children: Not on file  . Years of education: Not on file  . Highest education level: Not on file  Occupational History  . Not on file  Social Needs  . Financial resource strain: Not on file  . Food insecurity    Worry: Not on file    Inability: Not on file  . Transportation needs    Medical: Not on file    Non-medical: Not on file  Tobacco Use  . Smoking status: Never Smoker  . Smokeless tobacco: Never Used  Substance and Sexual Activity  . Alcohol use: No  . Drug use: No  . Sexual activity: Yes    Partners: Male  Lifestyle  . Physical activity    Days per week: Not on file    Minutes per session: Not on file  . Stress: Not on file  Relationships  . Social Musician on phone: Not on file    Gets together: Not on file    Attends religious service: Not on file    Active member of club or organization: Not on file    Attends meetings of clubs or organizations: Not on file    Relationship status: Not on file  Other Topics Concern  . Not on file  Social History Narrative  . Not on file   Family History: Family History  Problem Relation Age of Onset  . Diabetes Mother   . Cancer Mother        Breast  . Depression Mother   . CAD Mother   . Heart failure Father   . Stroke Father   . Alcohol abuse Father   . Alcohol abuse Brother   . Drug abuse Brother    Allergies: Allergies  Allergen Reactions  . Cefuroxime Swelling  . Lac Bovis Nausea And Vomiting  . Peanut Oil Other (See Comments)  . Pork Allergy Nausea And Vomiting  . Doxycycline Nausea And Vomiting  . Lactose Intolerance (Gi)   . Peanut-Containing Drug Products   . Trazodone And Nefazodone     Prolonged QTC  . Codeine Rash  . Penicillins Rash  . Sulfonamide Derivatives Rash   Medications: See med rec.  Review of Systems: No fevers, chills, night sweats, weight loss, chest pain, or shortness of breath.   Objective:    General: Well Developed, well nourished, and in no acute distress.  Neuro:  Alert and oriented x3, extra-ocular muscles intact, sensation grossly intact.  HEENT: Normocephalic, atraumatic, pupils equal round reactive to light, neck supple, no masses, no lymphadenopathy, thyroid nonpalpable.  Skin: Warm and dry, no rashes. Cardiac: Regular rate and rhythm, no murmurs rubs or gallops, no lower extremity edema.  Respiratory: Clear to auscultation bilaterally. Not using accessory muscles, speaking in full sentences. Right shoulder: Inspection reveals no abnormalities, atrophy or asymmetry. Palpation is normal with no tenderness over AC joint or bicipital groove. ROM is full in all planes. Rotator cuff strength normal throughout. Positive Neer and Hawkin's tests, empty can. Speeds and Yergason's tests normal. No labral pathology noted with negative Obrien's,  negative crank, negative clunk, and good stability. Normal scapular function observed. No painful arc and no drop arm sign. No apprehension sign  Procedure: Real-time Ultrasound Guided injection of the right subacromial bursa Device: Samsung HS60  Verbal informed consent obtained.  Time-out conducted.  Noted no overlying erythema, induration, or other signs of local infection.  Skin prepped in a sterile fashion.  Local anesthesia: Topical Ethyl chloride.  With sterile technique and under real time ultrasound guidance: 1 cc Kenalog 40, 1 cc lidocaine, 1 cc bupivacaine injected easily Completed without difficulty  Pain immediately resolved suggesting accurate placement of the medication.  Advised to call if fevers/chills, erythema, induration, drainage, or persistent bleeding.  Images permanently stored and available for review in the ultrasound unit.  Impression: Technically successful ultrasound guided injection.  Procedure: Real-time Ultrasound Guided injection of the right first [facility] Device: Samsung HS60  Verbal informed consent obtained.  Time-out conducted.  Noted no overlying erythema, induration, or other signs of local infection.  Skin prepped in a sterile fashion.  Local anesthesia: Topical Ethyl chloride.  With sterile technique and under real time ultrasound guidance:  1/2 cc Kenalog 40, 1/2 cc lidocaine injected easily completed without difficulty  Pain immediately resolved suggesting accurate placement of the medication.  Advised to call if fevers/chills, erythema, induration, drainage, or persistent bleeding.  Images permanently stored and available for review in the ultrasound unit.  Impression: Technically successful ultrasound guided injection.  Procedure: Real-time Ultrasound Guided injection of the left first [facility] Device: Samsung HS60  Verbal informed consent obtained.  Time-out conducted.  Noted no overlying erythema, induration, or other signs of local infection.   Skin prepped in a sterile fashion.  Local anesthesia: Topical Ethyl chloride.  With sterile technique and under real time ultrasound guidance:  1/2 cc Kenalog 40, 1/2 cc lidocaine injected easily completed without difficulty  Pain immediately resolved suggesting accurate placement of the medication.  Advised to call if fevers/chills, erythema, induration, drainage, or persistent bleeding.  Images permanently stored and available for review in the ultrasound unit.  Impression: Technically successful ultrasound guided injection.  Impression and Recommendations:    Primary osteoarthritis of both first carpometacarpal joints Bilateral first [place] injection, previously done in June.  Impingement syndrome of right shoulder Rotator cuff rehab given, subacromial injection today, return to see me in 1 month, MRI if no better.   ___________________________________________ Gwen Her. Dianah Field, M.D., ABFM., CAQSM. Primary Care and Sports Medicine [facility]  Adjunct Professor of [employer]

## 2019-03-28 NOTE — Assessment & Plan Note (Signed)
Bilateral first [place] injection, previously done in June.

## 2019-04-04 DIAGNOSIS — F411 Generalized anxiety disorder: Secondary | ICD-10-CM | POA: Diagnosis not present

## 2019-04-19 DIAGNOSIS — F411 Generalized anxiety disorder: Secondary | ICD-10-CM | POA: Diagnosis not present

## 2019-04-22 HISTORY — PX: ROTATOR CUFF REPAIR: SHX139

## 2019-04-25 ENCOUNTER — Other Ambulatory Visit: Payer: Self-pay

## 2019-04-25 ENCOUNTER — Ambulatory Visit (INDEPENDENT_AMBULATORY_CARE_PROVIDER_SITE_OTHER): Payer: BC Managed Care – PPO | Admitting: Sports Medicine

## 2019-04-25 DIAGNOSIS — M7541 Impingement syndrome of right shoulder: Secondary | ICD-10-CM

## 2019-04-25 DIAGNOSIS — M180 Bilateral primary osteoarthritis of first carpometacarpal joints: Secondary | ICD-10-CM

## 2019-04-25 MED ORDER — DICLOFENAC SODIUM 75 MG PO TBEC: 75.0000 mg | DELAYED_RELEASE_TABLET | Freq: Two times a day (BID) | ORAL | 3 refills | Status: AC

## 2019-04-25 NOTE — Assessment & Plan Note (Signed)
Shoulder impingement syndrome is also a chronic issue, however much better after subacromial injection at the last visit. Still not at goal. She still gets a bit of discomfort at night, currently doing diclofenac once a day, increasing to twice a day but she will start with bedtime dosing and add daytime dosing only if needed. Calling in medication now. Return to see me as needed.

## 2019-04-25 NOTE — Progress Notes (Signed)
    Procedures performed today:    None.  Independent interpretation of tests performed by another provider:   None.  Impression and Recommendations:    I have performed independent interpretation of the relevant labs and imaging ordered by this patient's other providers.  Primary osteoarthritis of both first carpometacarpal joints CMC arthritis, stable chronic illness. Much better now and essentially pain-free after bilateral CMC injections at the last visit.  Impingement syndrome of right shoulder Shoulder impingement syndrome is also a chronic issue, however much better after subacromial injection at the last visit. Still not at goal. She still gets a bit of discomfort at night, currently doing diclofenac once a day, increasing to twice a day but she will start with bedtime dosing and add daytime dosing only if needed. Calling in medication now. Return to see me as needed.    ___________________________________________ Ihor Austin. Benjamin Stain, M.D., ABFM., CAQSM. Primary Care and Sports Medicine [place] [facility]  Adjunct Instructor of Family Medicine  [employer]

## 2019-04-25 NOTE — Assessment & Plan Note (Signed)
CMC arthritis, stable chronic illness. Much better now and essentially pain-free after bilateral CMC injections at the last visit.

## 2019-05-05 DIAGNOSIS — F411 Generalized anxiety disorder: Secondary | ICD-10-CM | POA: Diagnosis not present

## 2019-05-30 DIAGNOSIS — F411 Generalized anxiety disorder: Secondary | ICD-10-CM | POA: Diagnosis not present

## 2019-06-02 DIAGNOSIS — F3342 Major depressive disorder, recurrent, in full remission: Secondary | ICD-10-CM | POA: Diagnosis not present

## 2019-06-02 DIAGNOSIS — F411 Generalized anxiety disorder: Secondary | ICD-10-CM | POA: Diagnosis not present

## 2019-06-20 ENCOUNTER — Other Ambulatory Visit: Payer: Self-pay

## 2019-06-20 ENCOUNTER — Ambulatory Visit (INDEPENDENT_AMBULATORY_CARE_PROVIDER_SITE_OTHER): Payer: BC Managed Care – PPO | Admitting: Sports Medicine

## 2019-06-20 DIAGNOSIS — M7541 Impingement syndrome of right shoulder: Secondary | ICD-10-CM

## 2019-06-20 NOTE — Assessment & Plan Note (Signed)
Darlene Ochoa returns, she is a pleasant 54 year old female with right shoulder impingement syndrome, likely shoulder bursitis. Back in December we did a subacromial injection, she was doing well until doing some yard work, now having recurrence of pain, repeat subacromial injection today, avoid overhead activities. Continue rehabilitation exercises and return to see me in a month. If still has some discomfort we will proceed with MRI.

## 2019-06-20 NOTE — Progress Notes (Signed)
    Procedures performed today:    Procedure: Real-time Ultrasound Guided injection of the right subacromial bursa Device: Samsung HS60  Verbal informed consent obtained.  Time-out conducted.  Noted no overlying erythema, induration, or other signs of local infection.  Skin prepped in a sterile fashion.  Local anesthesia: Topical Ethyl chloride.  With sterile technique and under real time ultrasound guidance: 1 cc Kenalog 40, 1 cc lidocaine, 1 cc bupivacaine injected easily Completed without difficulty  Pain immediately resolved suggesting accurate placement of the medication.  Advised to call if fevers/chills, erythema, induration, drainage, or persistent bleeding.  Images permanently stored and available for review in the ultrasound unit.  Impression: Technically successful ultrasound guided injection.  Independent interpretation of notes and tests performed by another provider:   None.  Impression and Recommendations:    Impingement syndrome of right shoulder Darlene Ochoa returns, she is a pleasant 54 year old female with right shoulder impingement syndrome, likely shoulder bursitis. Back in December we did a subacromial injection, she was doing well until doing some yard work, now having recurrence of pain, repeat subacromial injection today, avoid overhead activities. Continue rehabilitation exercises and return to see me in a month. If still has some discomfort we will proceed with MRI.    ___________________________________________ Ihor Austin. Benjamin Stain, M.D., ABFM., CAQSM. Primary Care and Sports Medicine [place] [facility]  Adjunct Instructor of Family Medicine  [employer] [facility]

## 2019-06-29 DIAGNOSIS — F411 Generalized anxiety disorder: Secondary | ICD-10-CM | POA: Diagnosis not present

## 2019-07-18 ENCOUNTER — Ambulatory Visit: Payer: BC Managed Care – PPO | Admitting: Sports Medicine

## 2019-07-19 ENCOUNTER — Other Ambulatory Visit: Payer: Self-pay

## 2019-07-19 ENCOUNTER — Ambulatory Visit: Payer: BC Managed Care – PPO | Admitting: Sports Medicine

## 2019-07-19 DIAGNOSIS — M7541 Impingement syndrome of right shoulder: Secondary | ICD-10-CM

## 2019-07-19 NOTE — Assessment & Plan Note (Signed)
Reniyah returns, she is a pleasant 54 year old female, she has subacromial impingement type symptoms. At the last visit we did a subacromial injection, she continued her rehab, she returns today and really did not get much relief. We are going to proceed with her second subacromial injection, as well as an MRI, return to see me to go over MRI results, if insufficient improvement we will proceed with shoulder arthroscopy referral.

## 2019-07-19 NOTE — Progress Notes (Signed)
    Procedures performed today:    Procedure: Real-time Ultrasound Guided injection of the right subacromial bursa Device: Samsung HS60  Verbal informed consent obtained.  Time-out conducted.  Noted no overlying erythema, induration, or other signs of local infection.  Skin prepped in a sterile fashion.  Local anesthesia: Topical Ethyl chloride.  With sterile technique and under real time ultrasound guidance: 1 cc Kenalog 40, 1 cc lidocaine, 1 cc bupivacaine injected easily Completed without difficulty  Pain immediately resolved suggesting accurate placement of the medication.  Advised to call if fevers/chills, erythema, induration, drainage, or persistent bleeding.  Images permanently stored and available for review in the ultrasound unit.  Impression: Technically successful ultrasound guided injection.  Independent interpretation of notes and tests performed by another provider:   None.  Brief History, Exam, Impression, and Recommendations:    Impingement syndrome of right shoulder Lunabella returns, she is a pleasant 54 year old female, she has subacromial impingement type symptoms. At the last visit we did a subacromial injection, she continued her rehab, she returns today and really did not get much relief. We are going to proceed with her second subacromial injection, as well as an MRI, return to see me to go over MRI results, if insufficient improvement we will proceed with shoulder arthroscopy referral.    ___________________________________________ Ihor Austin. Benjamin Stain, M.D., ABFM., CAQSM. Primary Care and Sports Medicine [place] [facility]  Adjunct Instructor of Family Medicine  [employer] [facility]

## 2019-07-20 DIAGNOSIS — F411 Generalized anxiety disorder: Secondary | ICD-10-CM | POA: Diagnosis not present

## 2019-07-31 ENCOUNTER — Ambulatory Visit (INDEPENDENT_AMBULATORY_CARE_PROVIDER_SITE_OTHER): Payer: BC Managed Care – PPO

## 2019-07-31 ENCOUNTER — Other Ambulatory Visit: Payer: Self-pay

## 2019-07-31 DIAGNOSIS — M25511 Pain in right shoulder: Secondary | ICD-10-CM | POA: Diagnosis not present

## 2019-07-31 DIAGNOSIS — M7541 Impingement syndrome of right shoulder: Secondary | ICD-10-CM | POA: Diagnosis not present

## 2019-08-08 DIAGNOSIS — F411 Generalized anxiety disorder: Secondary | ICD-10-CM | POA: Diagnosis not present

## 2019-09-04 ENCOUNTER — Other Ambulatory Visit: Payer: Self-pay | Admitting: Physician Assistant

## 2019-09-04 DIAGNOSIS — I10 Essential (primary) hypertension: Secondary | ICD-10-CM

## 2019-09-06 ENCOUNTER — Encounter: Payer: Self-pay | Admitting: Sports Medicine

## 2019-09-06 ENCOUNTER — Ambulatory Visit (INDEPENDENT_AMBULATORY_CARE_PROVIDER_SITE_OTHER): Payer: BC Managed Care – PPO | Admitting: Sports Medicine

## 2019-09-06 DIAGNOSIS — M7541 Impingement syndrome of right shoulder: Secondary | ICD-10-CM | POA: Diagnosis not present

## 2019-09-06 DIAGNOSIS — M180 Bilateral primary osteoarthritis of first carpometacarpal joints: Secondary | ICD-10-CM

## 2019-09-06 MED ORDER — HYDROCODONE-ACETAMINOPHEN 5-325 MG PO TABS: 1.0000 | ORAL_TABLET | Freq: Three times a day (TID) | ORAL | 0 refills | Status: DC | PRN

## 2019-09-06 NOTE — Assessment & Plan Note (Signed)
Good response to CMC injections approximately 6 months ago, repeated today due to recurrence of pain.

## 2019-09-06 NOTE — Progress Notes (Signed)
    Procedures performed today:    Procedure: Real-time Ultrasound Guided injection of the left first [facility] Device: Samsung HS60  Verbal informed consent obtained.  Time-out conducted.  Noted no overlying erythema, induration, or other signs of local infection.  Skin prepped in a sterile fashion.  Local anesthesia: Topical Ethyl chloride.  With sterile technique and under real time ultrasound guidance:  1/2 cc lidocaine, 1/2 cc Kenalog 40 injected easily  completed without difficulty  Pain immediately resolved suggesting accurate placement of the medication.  Advised to call if fevers/chills, erythema, induration, drainage, or persistent bleeding.  Images permanently stored and available for review in the ultrasound unit.  Impression: Technically successful ultrasound guided injection.  Procedure: Real-time Ultrasound Guided injection of the right first [facility] Device: Samsung HS60  Verbal informed consent obtained.  Time-out conducted.  Noted no overlying erythema, induration, or other signs of local infection.  Skin prepped in a sterile fashion.  Local anesthesia: Topical Ethyl chloride.  With sterile technique and under real time ultrasound guidance:  1/2 cc lidocaine, 1/2 cc Kenalog 40 injected easily  completed without difficulty  Pain immediately resolved suggesting accurate placement of the medication.  Advised to call if fevers/chills, erythema, induration, drainage, or persistent bleeding.  Images permanently stored and available for review in the ultrasound unit.  Impression: Technically successful ultrasound guided injection.  Independent interpretation of notes and tests performed by another provider:   None.  Brief History, Exam, Impression, and Recommendations:    Primary osteoarthritis of both first carpometacarpal joints Good response to CMC injections approximately 6 months ago, repeated today due to recurrence of pain.  Impingement syndrome of right  shoulder Darlene Ochoa returns, she is a pleasant 54 year old female with subacromial impingement type symptoms, we did a subacromial injection at the last visit, she also had an MRI. Unfortunately she continues to have severe pain, MRI does show significant supraspinatus and infraspinatus spurring, curved acromion. At this point because she has failed rehabilitation exercises, as well as to subacromial injections I think she is now a candidate for surgery, referral to Dr. Everardo Pacific, short course of low-dose hydrocodone in the meantime.    ___________________________________________ Ihor Austin. Benjamin Stain, M.D., ABFM., CAQSM. Primary Care and Sports Medicine [place] [facility]  Adjunct Instructor of Family Medicine  [employer]

## 2019-09-06 NOTE — Assessment & Plan Note (Signed)
Darlene Ochoa returns, she is a pleasant 54 year old female with subacromial impingement type symptoms, we did a subacromial injection at the last visit, she also had an MRI. Unfortunately she continues to have severe pain, MRI does show significant supraspinatus and infraspinatus spurring, curved acromion. At this point because she has failed rehabilitation exercises, as well as to subacromial injections I think she is now a candidate for surgery, referral to Dr. Everardo Pacific, short course of low-dose hydrocodone in the meantime.

## 2019-09-07 ENCOUNTER — Telehealth: Payer: Self-pay | Admitting: Physician Assistant

## 2019-09-07 NOTE — Telephone Encounter (Signed)
Thank you I will close referral for now and if patient wants decides to call and schedule an appointment I will reopen. - CF

## 2019-09-07 NOTE — Telephone Encounter (Signed)
[facility]  [phone]  We sent out a referral to [facility] for the Patient. They attempted multiple to contact the PT but no response.

## 2019-09-13 DIAGNOSIS — M25511 Pain in right shoulder: Secondary | ICD-10-CM | POA: Diagnosis not present

## 2019-09-16 ENCOUNTER — Telehealth: Payer: Self-pay | Admitting: Sports Medicine

## 2019-09-16 DIAGNOSIS — M7541 Impingement syndrome of right shoulder: Secondary | ICD-10-CM

## 2019-09-16 MED ORDER — HYDROCODONE-ACETAMINOPHEN 5-325 MG PO TABS: 1.0000 | ORAL_TABLET | Freq: Three times a day (TID) | ORAL | 0 refills | Status: DC | PRN

## 2019-09-16 NOTE — Telephone Encounter (Signed)
PT was seen on 09/06/19. She was wondering if she needs to continue the medication that was prescribed. Was given a 15 day supply. She is waiting for surgery   Please advise.   HYDROcodone

## 2019-09-16 NOTE — Telephone Encounter (Signed)
Happy to give a few more.

## 2019-09-16 NOTE — Addendum Note (Signed)
Addended by: Monica Becton on: 09/16/2019 11:49 AM   Modules accepted: Orders

## 2019-09-22 ENCOUNTER — Ambulatory Visit (INDEPENDENT_AMBULATORY_CARE_PROVIDER_SITE_OTHER): Payer: BC Managed Care – PPO | Admitting: Sports Medicine

## 2019-09-22 ENCOUNTER — Encounter: Payer: Self-pay | Admitting: Sports Medicine

## 2019-09-22 DIAGNOSIS — R200 Anesthesia of skin: Secondary | ICD-10-CM | POA: Diagnosis not present

## 2019-09-22 DIAGNOSIS — R202 Paresthesia of skin: Secondary | ICD-10-CM

## 2019-09-22 DIAGNOSIS — M180 Bilateral primary osteoarthritis of first carpometacarpal joints: Secondary | ICD-10-CM | POA: Diagnosis not present

## 2019-09-22 NOTE — Assessment & Plan Note (Signed)
Did not respond to bilateral CMC injections this time, I am going to go ahead and refer her for [facility] arthroplasty, we are going to pull the trigger for nerve conduction and EMG just to make sure were not missing a carpal tunnel syndrome.

## 2019-09-22 NOTE — Assessment & Plan Note (Signed)
Susi returns, she continues to have bilateral hand pain, she is dropping objects, pain is felt mostly in the thenar eminence. She really did not get relief from bilateral carpal metacarpal injections at the last visit, I would like to fully evaluate her with a nerve conduction and EMG of the bilateral upper extremities before considering [facility] arthroplasty surgery.

## 2019-09-22 NOTE — Progress Notes (Signed)
° ° °  Procedures performed today:    None.  Independent interpretation of notes and tests performed by another provider:   None.  Brief History, Exam, Impression, and Recommendations:    Numbness and tingling in both hands Darlene Ochoa returns, she continues to have bilateral hand pain, she is dropping objects, pain is felt mostly in the thenar eminence. She really did not get relief from bilateral carpal metacarpal injections at the last visit, I would like to fully evaluate her with a nerve conduction and EMG of the bilateral upper extremities before considering [facility] arthroplasty surgery.  Primary osteoarthritis of both first carpometacarpal joints Did not respond to bilateral CMC injections this time, I am going to go ahead and refer her for [facility] arthroplasty, we are going to pull the trigger for nerve conduction and EMG just to make sure were not missing a carpal tunnel syndrome.    ___________________________________________ Ihor Austin. Benjamin Stain, M.D., ABFM., CAQSM. Primary Care and Sports Medicine  [facility]  Adjunct Instructor of Family Medicine  [employer] [facility] [employer]

## 2019-09-23 DIAGNOSIS — F411 Generalized anxiety disorder: Secondary | ICD-10-CM | POA: Diagnosis not present

## 2019-09-27 ENCOUNTER — Other Ambulatory Visit: Payer: Self-pay | Admitting: Physician Assistant

## 2019-09-27 DIAGNOSIS — M25531 Pain in right wrist: Secondary | ICD-10-CM

## 2019-09-27 DIAGNOSIS — R200 Anesthesia of skin: Secondary | ICD-10-CM

## 2019-09-27 DIAGNOSIS — M79644 Pain in right finger(s): Secondary | ICD-10-CM

## 2019-09-27 DIAGNOSIS — M778 Other enthesopathies, not elsewhere classified: Secondary | ICD-10-CM

## 2019-09-27 DIAGNOSIS — M79641 Pain in right hand: Secondary | ICD-10-CM

## 2019-09-27 DIAGNOSIS — M79645 Pain in left finger(s): Secondary | ICD-10-CM

## 2019-09-28 DIAGNOSIS — F411 Generalized anxiety disorder: Secondary | ICD-10-CM | POA: Diagnosis not present

## 2019-09-28 DIAGNOSIS — F3342 Major depressive disorder, recurrent, in full remission: Secondary | ICD-10-CM | POA: Diagnosis not present

## 2019-10-17 DIAGNOSIS — S46011A Strain of muscle(s) and tendon(s) of the rotator cuff of right shoulder, initial encounter: Secondary | ICD-10-CM | POA: Diagnosis not present

## 2019-10-17 DIAGNOSIS — M7541 Impingement syndrome of right shoulder: Secondary | ICD-10-CM | POA: Diagnosis not present

## 2019-10-17 DIAGNOSIS — M24111 Other articular cartilage disorders, right shoulder: Secondary | ICD-10-CM | POA: Diagnosis not present

## 2019-10-17 DIAGNOSIS — M75121 Complete rotator cuff tear or rupture of right shoulder, not specified as traumatic: Secondary | ICD-10-CM | POA: Diagnosis not present

## 2019-10-17 DIAGNOSIS — G8918 Other acute postprocedural pain: Secondary | ICD-10-CM | POA: Diagnosis not present

## 2019-10-17 DIAGNOSIS — M25711 Osteophyte, right shoulder: Secondary | ICD-10-CM | POA: Diagnosis not present

## 2019-10-17 DIAGNOSIS — M7521 Bicipital tendinitis, right shoulder: Secondary | ICD-10-CM | POA: Diagnosis not present

## 2019-10-25 DIAGNOSIS — M24111 Other articular cartilage disorders, right shoulder: Secondary | ICD-10-CM | POA: Diagnosis not present

## 2019-10-27 ENCOUNTER — Ambulatory Visit (INDEPENDENT_AMBULATORY_CARE_PROVIDER_SITE_OTHER): Payer: BC Managed Care – PPO | Admitting: Physical Therapy

## 2019-10-27 ENCOUNTER — Other Ambulatory Visit: Payer: Self-pay

## 2019-10-27 DIAGNOSIS — R600 Localized edema: Secondary | ICD-10-CM | POA: Diagnosis not present

## 2019-10-27 DIAGNOSIS — M6281 Muscle weakness (generalized): Secondary | ICD-10-CM

## 2019-10-27 DIAGNOSIS — M25511 Pain in right shoulder: Secondary | ICD-10-CM | POA: Diagnosis not present

## 2019-10-27 DIAGNOSIS — M25611 Stiffness of right shoulder, not elsewhere classified: Secondary | ICD-10-CM | POA: Diagnosis not present

## 2019-10-27 NOTE — Therapy (Signed)
[facility] [place] [address] [place], [place], [address] Phone: [phone]   Fax:  [phone]  Physical Therapy Evaluation  Patient Details  Name: Darlene Ochoa MRN: [id number] Date of Birth: [birth year] Referring Provider (PT): Ramond Marrow, MD   Encounter Date: 10/27/2019   PT End of Session - 10/27/19 0846    Visit Number 1    Date for PT Re-Evaluation 12/22/19    Authorization Type BCBS    PT Start Time [phone]    PT Stop Time 0932    PT Time Calculation (min) 46 min    Activity Tolerance Patient tolerated treatment well    Behavior During Therapy [facility] for tasks assessed/performed           Past Medical History:  Diagnosis Date  . Anxiety   . Chest pain 03/31/2012  . Depression   . H/O prolonged Q-T interval on ECG 03/31/2012  . Hypertension   . Major depressive disorder 03/31/2012  . Seasonal allergies     Past Surgical History:  Procedure Laterality Date  . CESAREAN SECTION  1998    There were no vitals filed for this visit.    Subjective Assessment - 10/27/19 0849    Subjective Patient had right RCR/DCE/SAD on 10/17/19. Patient reports only some pain. Last night was a bad night.    Pertinent History HTN, anxiety, depression    Patient Stated Goals New grandbaby coming in Oct so to hurry up and be well    Currently in Pain? Yes    Pain Score 7    on average 4/10 or less   Pain Location Shoulder    Pain Orientation Right    Pain Descriptors / Indicators Burning    Pain Type Surgical pain    Pain Onset 1 to 4 weeks ago    Pain Frequency Constant    Pain Relieving Factors icing, meds              [facility] PT Assessment - 10/27/19 0001      Assessment   Medical Diagnosis Rt RCR/DCE/SAD    Referring Provider (PT) Ramond Marrow, MD    Onset Date/Surgical Date 10/17/19    Hand Dominance Right    Next MD Visit none scheduled      Precautions   Precautions Shoulder    Required Braces or Orthoses Sling        Restrictions   Weight Bearing Restrictions Yes    RUE Weight Bearing Non weight bearing      Balance Screen   Has the patient fallen in the past 6 months No    Has the patient had a decrease in activity level because of a fear of falling?  No    Is the patient reluctant to leave their home because of a fear of falling?  No      Home Tourist information centre manager residence    Living Arrangements Spouse/significant other      Prior Function   Level of Independence Independent    Vocation Works at home    Leisure craftiing, Programmer, systems, gardening, pets      Observation/Other Assessments   Focus on Therapeutic Outcomes (FOTO)  87% limitations ( 36% goal)      Posture/Postural Control   Posture Comments rounded shoulders      ROM / Strength   AROM / PROM / Strength PROM;AROM      AROM   AROM Assessment Site Elbow    Right/Left Elbow  Right    Right Elbow Flexion 128    Right Elbow Extension -25      PROM   PROM Assessment Site Elbow    Right/Left Shoulder Right    Right Shoulder Flexion 79 Degrees    Right Shoulder ABduction 67 Degrees    Right Shoulder Internal Rotation --   arm to stomach at side   Right Shoulder External Rotation -26 Degrees   arm at side   Right/Left Elbow Right    Right Elbow Flexion 155    Right Elbow Extension -5      Palpation   Palpation comment mild tenderness around incisions and in biceps                       Objective measurements completed on examination: See above findings.               PT Education - 10/27/19 1204    Education Details Precautions reviewed; HEP issued    Person(s) Educated Patient    Methods Explanation;Demonstration;Handout    Comprehension Verbalized understanding;Returned demonstration            PT Short Term Goals - 10/27/19 1213      PT SHORT TERM GOAL #1   Title Ind with initial HEP    Time 2    Period Weeks    Status New    Target Date 11/10/19      PT SHORT  TERM GOAL #2   Title PROM to protocol goals by 6 weeks (11/28/19)    Time 4    Period Weeks    Status New      PT SHORT TERM GOAL #3   Title Patient ind in [place] HEP for right shoulder    Time 8    Period Weeks    Status New    Target Date 12/22/19      PT SHORT TERM GOAL #4   Title Pt to demo full AROM of right elbow    Time 8    Period Weeks    Status New                     Plan - 10/27/19 1204    Clinical Impression Statement Patient presents s/p Rt RCR/SAD/DCE on 10/17/19. She has limitions in right shoulder ROM and strength and post surgical pain. She will benefit from PT to regain ROM and strength to return her to her PLOF. She has a new grandchild coming in Oct and wants to be able to help. Additionally she does a lot of crafting. She also reports bil thumb pain and has marked tightness and tenderness in surrounding musculature which may benefit from dry needling and manual therapy when appropriate.    Personal Factors and Comorbidities Comorbidity 3+    Comorbidities anxiety, depression, bil thumb pain    Examination-Activity Limitations Bathing;Carry;Lift;Reach Overhead    Examination-Participation Restrictions Cleaning;Meal Prep;Other;Laundry   crafts   Stability/Clinical Decision Making Stable/Uncomplicated    Clinical Decision Making Low    Rehab Potential Excellent    PT Frequency 2x / week    PT Duration 8 weeks    PT Treatment/Interventions ADLs/Self Care Home Management;Cryotherapy;Electrical Stimulation;Iontophoresis 4mg /ml Dexamethasone;Moist Heat;Therapeutic exercise;Therapeutic activities;Neuromuscular re-education;Patient/family education;Manual techniques;Taping;Dry needling;Passive range of motion;Vasopneumatic Device;Joint Manipulations;Spinal Manipulations    PT Next Visit Plan Review HEP; elbow ROM; PROM per protocol until 11/28/19  Per MD: flex to 140 deg, ER at side 40 deg; ABD max 60-80 without  rotation; heat before/ice after    PT Home Exercise  Plan [email or web address]    Consulted and Agree with Plan of Care Patient           Patient will benefit from skilled therapeutic intervention in order to improve the following deficits and impairments:  Decreased range of motion, Increased edema, Impaired UE functional use, Impaired flexibility, Decreased strength, Pain, Postural dysfunction  Visit Diagnosis: Stiffness of right shoulder, not elsewhere classified - Plan: PT plan of care cert/re-cert  Acute pain of right shoulder - Plan: PT plan of care cert/re-cert  Localized edema - Plan: PT plan of care cert/re-cert  Muscle weakness (generalized) - Plan: PT plan of care cert/re-cert     Problem List Patient Active Problem List   Diagnosis Date Noted  . Impingement syndrome of right shoulder 03/28/2019  . Primary osteoarthritis of both first carpometacarpal joints 07/29/2018  . Tendonitis of both wrists 06/01/2018  . Numbness and tingling in both hands 06/01/2018  . Eustachian tube dysfunction, bilateral 08/17/2017  . Angio-edema 08/10/2017  . Environmental allergies 07/21/2017  . Seasonal allergies 07/21/2017  . Seasonal allergic rhinitis 07/09/2017  . Post-nasal drip 07/09/2017  . 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 Trapezius muscle spasm 08/21/2016  . Vitamin D deficiency 08/01/2014  . B12 deficiency 08/01/2014  . Prolonged Q-T interval on ECG 10/06/2013  . Major depressive disorder, recurrent episode, moderate (HCC) 12/27/2012  . Generalized anxiety disorder 12/27/2012  . Anxiety 04/27/2012  . Essential hypertension 04/27/2012  . Major depressive disorder 03/31/2012  . Chest pain 03/31/2012  . Shortness of breath 03/31/2012  . Migraine 03/31/2012  . PALPITATIONS 08/02/2009    Solon Palm PT 10/27/2019, 12:29 PM  [facility] [address] [place], [place], [address] Phone: [phone]   Fax:  [phone]  Name: Chaley Castellanos MRN: [id number] Date of Birth:  [birth year]

## 2019-10-27 NOTE — Patient Instructions (Signed)
Access Code: [email or web address] URL: [email or web address] Date: 10/27/2019 Prepared by: Raynelle Fanning  Exercises Seated Finger Composite Flexion Extension - 2 x daily - 7 x weekly - 3 sets - 15 reps Wrist AROM Flexion Extension - 2 x daily - 7 x weekly - 3 sets - 15 reps Seated Elbow Flexion Extension AROM - 2 x daily - 7 x weekly - 3 sets - 15 reps Circular Shoulder Pendulum with Table Support - 2 x daily - 7 x weekly - 3 sets - 15 reps Flexion-Extension Shoulder Pendulum with Table Support - 2 x daily - 7 x weekly - 3 sets - 15 reps Horizontal Shoulder Pendulum with Table Support - 2 x daily - 7 x weekly - 3 sets - 15 reps Seated Upper Trapezius Stretch - 2 x daily - 7 x weekly - 1 sets - 3 reps - 30 sec hold Seated Scapular Retraction - 2 x daily - 7 x weekly - 1-3 sets - 10 reps

## 2019-10-31 ENCOUNTER — Ambulatory Visit: Payer: BC Managed Care – PPO | Admitting: Physical Therapy

## 2019-10-31 ENCOUNTER — Other Ambulatory Visit: Payer: Self-pay

## 2019-10-31 ENCOUNTER — Encounter: Payer: Self-pay | Admitting: Physical Therapy

## 2019-10-31 DIAGNOSIS — M25611 Stiffness of right shoulder, not elsewhere classified: Secondary | ICD-10-CM | POA: Diagnosis not present

## 2019-10-31 DIAGNOSIS — M6281 Muscle weakness (generalized): Secondary | ICD-10-CM

## 2019-10-31 DIAGNOSIS — M25511 Pain in right shoulder: Secondary | ICD-10-CM | POA: Diagnosis not present

## 2019-10-31 DIAGNOSIS — R600 Localized edema: Secondary | ICD-10-CM

## 2019-10-31 NOTE — Therapy (Signed)
[facility] [place] [address] [place], [place], [address] Phone: [phone]   Fax:  [phone]  Physical Therapy Treatment  Patient Details  Name: Darlene Ochoa MRN: [id number] Date of Birth: 1965-04-29 Referring Provider (PT): Ramond Marrow, MD   Encounter Date: 10/31/2019   PT End of Session - 10/31/19 0853    Visit Number 2    Date for PT Re-Evaluation 12/22/19    Authorization Type BCBS    PT Start Time 0848    PT Stop Time 0937    PT Time Calculation (min) 49 min    Activity Tolerance Patient tolerated treatment well    Behavior During Therapy [facility] for tasks assessed/performed           Past Medical History:  Diagnosis Date  . Anxiety   . Chest pain 03/31/2012  . Depression   . H/O prolonged Q-T interval on ECG 03/31/2012  . Hypertension   . Major depressive disorder 03/31/2012  . Seasonal allergies     Past Surgical History:  Procedure Laterality Date  . CESAREAN SECTION  1998    There were no vitals filed for this visit.   Subjective Assessment - 10/31/19 0854    Subjective I slept on it wrong and it's hurting in my neck.    Pertinent History HTN, anxiety, depression    Patient Stated Goals New grandbaby coming in Oct so to hurry up and be well    Currently in Pain? Yes    Pain Score 4     Pain Location Shoulder    Pain Orientation Right    Pain Descriptors / Indicators Burning    Pain Type Surgical pain                             [facility] Adult PT Treatment/Exercise - 10/31/19 0001      Exercises   Exercises Shoulder      Shoulder Exercises: Supine   Other Supine Exercises neck retraction x 5 x 5 sec; scapular retraction x 5      Modalities   Modalities Vasopneumatic      Vasopneumatic   Number Minutes Vasopneumatic  10 minutes    Vasopnuematic Location  Shoulder    Vasopneumatic Pressure Medium    Vasopneumatic Temperature  34 deg      Manual Therapy   Manual Therapy  Soft tissue mobilization;Myofascial release;Passive ROM;Scapular mobilization;Joint mobilization    Manual therapy comments Rt shoulder/neck    Joint Mobilization CPA and Rt UPA to cspine Gd II/III    Soft tissue mobilization to Rt UT, scalenes, levator, wrist extensors and supinator    Myofascial Release to Rt UT and Levator scapula    Scapular Mobilization in supine inf/sup/pro/retraction    Passive ROM to right shoulder within protocol limits, ER/flex/ABD                    PT Short Term Goals - 10/27/19 1213      PT SHORT TERM GOAL #1   Title Ind with initial HEP    Time 2    Period Weeks    Status New    Target Date 11/10/19      PT SHORT TERM GOAL #2   Title PROM to protocol goals by 6 weeks (11/28/19)    Time 4    Period Weeks    Status New      PT SHORT TERM GOAL #3  Title Patient ind in [facility] HEP for right shoulder    Time 8    Period Weeks    Status New    Target Date 12/22/19      PT SHORT TERM GOAL #4   Title Pt to demo full AROM of right elbow    Time 8    Period Weeks    Status New                    Plan - 10/31/19 1136    Clinical Impression Statement Patient presents with increased soreness in her right neck musculature and right wrist extensors. She responded very well to manual therapy here and in forearm with full elbow ext afterward. PROM performed within protocol limits. ER still not to neutral.    PT Frequency 2x / week    PT Duration 8 weeks    PT Treatment/Interventions ADLs/Self Care Home Management;Cryotherapy;Electrical Stimulation;Iontophoresis 4mg /ml Dexamethasone;Moist Heat;Therapeutic exercise;Therapeutic activities;Neuromuscular re-education;Patient/family education;Manual techniques;Taping;Dry needling;Passive range of motion;Vasopneumatic Device;Joint Manipulations;Spinal Manipulations    PT Next Visit Plan Review HEP; elbow ROM; PROM per protocol until 11/28/19  Per MD: flex to 140 deg, ER at side 40 deg; ABD max 60-80  without rotation; heat before/ice after    PT Home Exercise Plan [id number]           Patient will benefit from skilled therapeutic intervention in order to improve the following deficits and impairments:  Decreased range of motion, Increased edema, Impaired UE functional use, Impaired flexibility, Decreased strength, Pain, Postural dysfunction  Visit Diagnosis: Stiffness of right shoulder, not elsewhere classified  Acute pain of right shoulder  Localized edema  Muscle weakness (generalized)     Problem List Patient Active Problem List   Diagnosis Date Noted  . Impingement syndrome of right shoulder 03/28/2019  . Primary osteoarthritis of both first carpometacarpal joints 07/29/2018  . Tendonitis of both wrists 06/01/2018  . Numbness and tingling in both hands 06/01/2018  . Eustachian tube dysfunction, bilateral 08/17/2017  . Angio-edema 08/10/2017  . Environmental allergies 07/21/2017  . Seasonal allergies 07/21/2017  . Seasonal allergic rhinitis 07/09/2017  . Post-nasal drip 07/09/2017  . 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 Trapezius muscle spasm 08/21/2016  . Vitamin D deficiency 08/01/2014  . B12 deficiency 08/01/2014  . Prolonged Q-T interval on ECG 10/06/2013  . Major depressive disorder, recurrent episode, moderate (HCC) 12/27/2012  . Generalized anxiety disorder 12/27/2012  . Anxiety 04/27/2012  . Essential hypertension 04/27/2012  . Major depressive disorder 03/31/2012  . Chest pain 03/31/2012  . Shortness of breath 03/31/2012  . Migraine 03/31/2012  . PALPITATIONS 08/02/2009    08/04/2009 PT 10/31/2019, 11:39 AM  [facility] [address] [place], [place], [place] Phone: [phone]   Fax:  [phone]  Name: Darlene Ochoa MRN: [name] Date of Birth: January 03, 1966

## 2019-11-01 DIAGNOSIS — M79644 Pain in right finger(s): Secondary | ICD-10-CM | POA: Diagnosis not present

## 2019-11-01 DIAGNOSIS — M79645 Pain in left finger(s): Secondary | ICD-10-CM | POA: Diagnosis not present

## 2019-11-01 DIAGNOSIS — M180 Bilateral primary osteoarthritis of first carpometacarpal joints: Secondary | ICD-10-CM | POA: Diagnosis not present

## 2019-11-02 DIAGNOSIS — F411 Generalized anxiety disorder: Secondary | ICD-10-CM | POA: Diagnosis not present

## 2019-11-04 ENCOUNTER — Telehealth: Payer: Self-pay

## 2019-11-04 ENCOUNTER — Emergency Department (INDEPENDENT_AMBULATORY_CARE_PROVIDER_SITE_OTHER)
Admission: EM | Admit: 2019-11-04 | Discharge: 2019-11-04 | Disposition: A | Discharge status: Home / Self Care | Payer: BC Managed Care – PPO | Source: Home / Self Care

## 2019-11-04 ENCOUNTER — Encounter: Payer: BC Managed Care – PPO | Admitting: Rehabilitative and Restorative Service Providers"

## 2019-11-04 ENCOUNTER — Other Ambulatory Visit: Payer: Self-pay

## 2019-11-04 DIAGNOSIS — Z20822 Contact with and (suspected) exposure to covid-19: Secondary | ICD-10-CM

## 2019-11-04 DIAGNOSIS — R52 Pain, unspecified: Secondary | ICD-10-CM

## 2019-11-04 DIAGNOSIS — R509 Fever, unspecified: Secondary | ICD-10-CM | POA: Diagnosis not present

## 2019-11-04 DIAGNOSIS — N12 Tubulo-interstitial nephritis, not specified as acute or chronic: Secondary | ICD-10-CM

## 2019-11-04 LAB — POC SARS CORONAVIRUS 2 AG -  ED: SARS Coronavirus 2 Ag: NEGATIVE

## 2019-11-04 LAB — POCT URINALYSIS DIP (MANUAL ENTRY)
Bilirubin, UA: NEGATIVE
Glucose, UA: NEGATIVE mg/dL
Ketones, POC UA: NEGATIVE mg/dL
Nitrite, UA: POSITIVE — AB
Spec Grav, UA: 1.02 (ref 1.010–1.025)
Urobilinogen, UA: 0.2 E.U./dL
pH, UA: 5.5 (ref 5.0–8.0)

## 2019-11-04 MED ORDER — NITROFURANTOIN MONOHYD MACRO 100 MG PO CAPS
100.0000 mg | ORAL_CAPSULE | Freq: Two times a day (BID) | ORAL | 0 refills | Status: DC
Start: 2019-11-04 — End: 2020-03-28

## 2019-11-04 MED ORDER — PROMETHAZINE HCL 25 MG PO TABS
25.0000 mg | ORAL_TABLET | Freq: Two times a day (BID) | ORAL | 0 refills | Status: DC | PRN
Start: 2019-11-04 — End: 2020-08-20

## 2019-11-04 MED ORDER — ACETAMINOPHEN 325 MG PO TABS
650.0000 mg | ORAL_TABLET | Freq: Once | ORAL | Status: AC
  Administered 2019-11-04: 650 mg via ORAL

## 2019-11-04 NOTE — ED Provider Notes (Addendum)
[facility]    CSN: [id number] Arrival date & time: 11/04/19  0850      History   Chief Complaint Chief Complaint  Patient presents with  . Generalized Body Aches    HPI Darlene Ochoa is a 54 y.o. female.   HPI  Patient presents with acute onset of generalized body pain.  She endorses that the pain in her legs gradually worsened over night.  She had a prescription for oxycodone and took 1 tablet last night.  Upon arrival here today patient was temperature is 103.  She is unaware of any exposure to anyone positive for COVID-19.  She subsequently recalls earlier in the week experiencing some dysuria symptoms which resolved with drinking cranberry juice and large volumes of water.  She denies any visible hematuria.  Endorses low back pain.  Denies a history of recurrent UTIs.  Past Medical History:  Diagnosis Date  . Anxiety   . Chest pain 03/31/2012  . Depression   . H/O prolonged Q-T interval on ECG 03/31/2012  . Hypertension   . Major depressive disorder 03/31/2012  . Seasonal allergies     Patient Active Problem List   Diagnosis Date Noted  . Impingement syndrome of right shoulder 03/28/2019  . Primary osteoarthritis of both first carpometacarpal joints 07/29/2018  . Tendonitis of both wrists 06/01/2018  . Numbness and tingling in both hands 06/01/2018  . Eustachian tube dysfunction, bilateral 08/17/2017  . Angio-edema 08/10/2017  . Environmental allergies 07/21/2017  . Seasonal allergies 07/21/2017  . Seasonal allergic rhinitis 07/09/2017  . Post-nasal drip 07/09/2017  . 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 Trapezius muscle spasm 08/21/2016  . Vitamin D deficiency 08/01/2014  . B12 deficiency 08/01/2014  . Prolonged Q-T interval on ECG 10/06/2013  . Major depressive disorder, recurrent episode, moderate (HCC) 12/27/2012  . Generalized anxiety disorder 12/27/2012  . Anxiety 04/27/2012  . Essential hypertension 04/27/2012  . Major depressive disorder 03/31/2012    . Chest pain 03/31/2012  . Shortness of breath 03/31/2012  . Migraine 03/31/2012  . PALPITATIONS 08/02/2009    Past Surgical History:  Procedure Laterality Date  . CESAREAN SECTION  1998  . ROTATOR CUFF REPAIR Right 2021    OB History   No obstetric history on file.      Home Medications    Prior to Admission medications   Medication Sig Start Date End Date Taking? Authorizing Provider  buPROPion (WELLBUTRIN XL) 150 MG 24 hr tablet Take 150 mg by mouth daily. 11/25/16   [provider]  buPROPion (WELLBUTRIN XL) 300 MG 24 hr tablet Take 300 mg by mouth daily. 10/28/16   [provider]  Cholecalciferol (VITAMIN D3) 50000 units CAPS Vitamin D2 50,000 unit capsule    [provider]  diclofenac (VOLTAREN) 75 MG EC tablet Take 1 tablet (75 mg total) by mouth 2 (two) times daily. 04/25/19 04/24/20  Monica Becton, MD  diclofenac Sodium (VOLTAREN) 1 % GEL APPLY 4 G TOPICALLY 4 (FOUR) TIMES DAILY. TO AFFECTED JOINT. 09/27/19   Tandy Gaw L, PA-C  gabapentin (NEURONTIN) 300 MG capsule One tab PO qHS for a week, then BID for a week, then TID. May double weekly to a max of 3,600mg /day 12/17/18   Rodolph Bong, MD  hydrochlorothiazide (HYDRODIURIL) 25 MG tablet TAKE 1 TABLET DAILY 09/05/19   Breeback, Jade L, PA-C  HYDROcodone-acetaminophen (NORCO/VICODIN) 5-325 MG tablet Take 1 tablet by mouth every 8 (eight) hours as needed for moderate pain. 09/16/19   Thekkekandam,  Ihor Austin, MD  SUMAtriptan (IMITREX) 50 MG tablet Take 1 tablet (50 mg total) by mouth every 2 (two) hours as needed for migraine. May repeat in 2 hours if headache persists or recurs. 12/04/16   Carlis Stable, PA-C    Family History Family History  Problem Relation Age of Onset  . Diabetes Mother   . Cancer Mother        Breast  . Depression Mother   . CAD Mother   . Heart failure Father   . Stroke Father   . Alcohol abuse Father   . Alcohol abuse Brother   . Drug abuse  Brother     Social History Social History   Tobacco Use  . Smoking status: Never Smoker  . Smokeless tobacco: Never Used  Substance Use Topics  . Alcohol use: No  . Drug use: No     Allergies   Cefuroxime, [place], Peanut oil, Pork allergy, Doxycycline, Lactose intolerance (gi), Peanut-containing drug products, Trazodone and nefazodone, Codeine, Penicillins, and Sulfonamide derivatives   Review of Systems Review of Systems Pertinent negatives listed in HPI  Physical Exam Triage Vital Signs ED Triage Vitals  Enc Vitals Group     BP 11/04/19 0925 107/72     Pulse Rate 11/04/19 0925 92     Resp 11/04/19 0925 16     Temp 11/04/19 0925 (!) 103 F (39.4 C)     Temp Source 11/04/19 0925 Oral     SpO2 11/04/19 0925 99 %     Weight --      Height --      Head Circumference --      Peak Flow --      Pain Score 11/04/19 0922 6     Pain Loc --      Pain Edu? --      Excl. in GC? --    No data found.  Updated Vital Signs BP 107/72 (BP Location: Left Arm)   Pulse 92   Temp (!) 103 F (39.4 C) (Oral)   Resp 16   LMP 06/18/2017 (Approximate)   SpO2 99%   Visual Acuity Right Eye Distance:   Left Eye Distance:   Bilateral Distance:    Right Eye Near:   Left Eye Near:    Bilateral Near:     Physical Exam General appearance: alert, well developed, ill-appearing.   Head: Normocephalic, without obvious abnormality, atraumatic Respiratory: Respirations even and unlabored, normal respiratory rate Heart: rate and rhythm normal.  Abdomen: BS +, no distention, no rebound tenderness, +CVA tenderness  Extremities: No gross deformities Skin: Skin color, texture, turgor normal. No rashes seen  Psych: Appropriate mood and affect.  UC Treatments / Results  Labs (all labs ordered are listed, but only abnormal results are displayed) Labs Reviewed  URINE CULTURE - Abnormal; Notable for the following components:      Result Value   ISOLATE 1: ESBL Escherichia coli (*)     All other components within normal limits  POCT URINALYSIS DIP (MANUAL ENTRY) - Abnormal; Notable for the following components:   Clarity, UA cloudy (*)    Blood, UA small (*)    Protein Ur, POC trace (*)    Nitrite, UA Positive (*)    Leukocytes, UA Moderate (2+) (*)    All other components within normal limits  SARS-COV-2 RNA,(COVID-19) QUALITATIVE NAAT  POC SARS CORONAVIRUS 2 AG -  ED    EKG   Radiology No results found.  Procedures Procedures (  including critical care time)  Medications Ordered in UC Medications - No data to display  Initial Impression / Assessment and Plan / UC Course  I have reviewed the triage vital signs and the nursing notes.  Pertinent labs & imaging results that were available during my care of the patient were reviewed by me and considered in my medical decision making (see chart for details).     Rapid COVID-19 negative. PCR pending. UA significant for bacteria concerning for pyelonephritis. Urine culture pending . Given high fever, symptoms, and UA findings will treat for pyelonephritis with Nitrofurantoin 100 mg BID x 10 days. Discussed at length warning signs of urosepsis and indication to go immediately to the emergency department. Continue Tylenol as needed for body aches and fever.  Hydrate well to prevent dehydration.  Patient verbalized understanding and agreement with plan. An After Visit Summary was printed and given to the patient/family. Precautions discussed. Red flags discussed.Questions invited and answered. They voiced understanding and agreement.  Final Clinical Impressions(s) / UC Diagnoses   Final diagnoses:  Suspected COVID-19 virus infection  Generalized body aches  Fever, unspecified  Pyelonephritis     Discharge Instructions     If symptoms worsen,  go immediately to emergency departments.    ED Prescriptions    Medication Sig Dispense Auth. Provider   nitrofurantoin, macrocrystal-monohydrate, (MACROBID) 100 MG  capsule Take 1 capsule (100 mg total) by mouth 2 (two) times daily. 20 capsule Bing Neighbors, FNP   promethazine (PHENERGAN) 25 MG tablet Take 1 tablet (25 mg total) by mouth 2 (two) times daily as needed for nausea or vomiting. 30 tablet Bing Neighbors, FNP     PDMP not reviewed this encounter.   Bing Neighbors, FNP 11/06/19 2229    Bing Neighbors, FNP 11/06/19 2230

## 2019-11-04 NOTE — Discharge Instructions (Addendum)
If symptoms worsen,  go immediately to emergency departments.

## 2019-11-04 NOTE — Telephone Encounter (Signed)
Pt called stating she is having bilateral arm / leg pain. Per pt, she had rotator cuff surgery 3 weeks ago. Pt mentioned that she feels as though her blood pressure may be elevated, however she did not check to see what it was with a monitor. Due to no availability of appointments, pt was informed to seek care in UC. She was also informed to contact the surgeon who completed her surgery. Pt was agreeable with plan.

## 2019-11-04 NOTE — ED Triage Notes (Addendum)
Patient presents to Urgent Care with complaints of body aches, mainly on her legs since last night. Patient reports she had rotator cuff surgery 3 weeks ago and has been taking it easy since then. Pt states last night her legs felt like they were burning, has also given her slight nausea. Feels like the pain starts in her lower back and extends down both legs, posteriorly. Pt took oxycodone this morning around 0100.

## 2019-11-05 LAB — SARS-COV-2 RNA,(COVID-19) QUALITATIVE NAAT: SARS CoV2 RNA: NOT DETECTED

## 2019-11-06 LAB — URINE CULTURE
MICRO NUMBER:: 10714815
SPECIMEN QUALITY:: ADEQUATE

## 2019-11-07 ENCOUNTER — Other Ambulatory Visit: Payer: Self-pay

## 2019-11-07 ENCOUNTER — Encounter: Payer: Self-pay | Admitting: Rehabilitative and Restorative Service Providers"

## 2019-11-07 ENCOUNTER — Ambulatory Visit: Payer: BC Managed Care – PPO | Admitting: Rehabilitative and Restorative Service Providers"

## 2019-11-07 DIAGNOSIS — M25511 Pain in right shoulder: Secondary | ICD-10-CM

## 2019-11-07 DIAGNOSIS — M6281 Muscle weakness (generalized): Secondary | ICD-10-CM

## 2019-11-07 DIAGNOSIS — M25611 Stiffness of right shoulder, not elsewhere classified: Secondary | ICD-10-CM

## 2019-11-07 DIAGNOSIS — R600 Localized edema: Secondary | ICD-10-CM

## 2019-11-07 MED ORDER — CIPROFLOXACIN HCL 500 MG PO TABS: 500.0000 mg | ORAL_TABLET | Freq: Two times a day (BID) | ORAL | 0 refills | Status: AC

## 2019-11-07 NOTE — Telephone Encounter (Signed)
Pt came in to [facility] with c/o GI upset from macrobid prescribed for pyelonephrosis. Pt requesting medication changed. Will send in Cipro based on urine culture. Encouraged to f/u with PCP in 2 days if not improving, go to hospital if worsening. Encouraged to take with food and stay hydrated.

## 2019-11-07 NOTE — Therapy (Signed)
[facility] [place] [address] [place], [place], [address] Phone: [phone]   Fax:  [phone]  Physical Therapy Treatment  Patient Details  Name: Darlene Ochoa MRN: [id number] Date of Birth: 1965/08/01 Referring Provider (PT): Ramond Marrow, MD   Encounter Date: 11/07/2019   PT End of Session - 11/07/19 0848    Visit Number 3    Number of Visits 16    Date for PT Re-Evaluation 12/22/19    Authorization Type BCBS    PT Start Time 0847    PT Stop Time 0936    PT Time Calculation (min) 49 min    Activity Tolerance Patient tolerated treatment well           Past Medical History:  Diagnosis Date  . Anxiety   . Chest pain 03/31/2012  . Depression   . H/O prolonged Q-T interval on ECG 03/31/2012  . Hypertension   . Major depressive disorder 03/31/2012  . Seasonal allergies     Past Surgical History:  Procedure Laterality Date  . CESAREAN SECTION  1998  . ROTATOR CUFF REPAIR Right 2021    There were no vitals filed for this visit.   Subjective Assessment - 11/07/19 0850    Subjective Hard not to use her shoulder and arm. She is sleeping "OK'. Has had a UTI over the weekend. Shoulder is uncomfortable but not painful today. Exercises are OK.    Currently in Pain? Yes    Pain Score 0-No pain                             [facility] Adult PT Treatment/Exercise - 11/07/19 0001      Shoulder Exercises: Standing   Other Standing Exercises elbow flex/ext; supination/pronation; fisting Rt UE x 5 reps each       Shoulder Exercises: ROM/Strengthening   Pendulum side to side; fwd/back; circles CW/CCW x 10-15 each pt resting Lt hand on table flexed forw at 90 deg at hips       Vasopneumatic   Number Minutes Vasopneumatic  10 minutes    Vasopnuematic Location  Shoulder    Vasopneumatic Pressure Medium    Vasopneumatic Temperature  34 deg      Manual Therapy   Manual Therapy Soft tissue mobilization;Myofascial  release;Passive ROM;Scapular mobilization;Joint mobilization    Manual therapy comments Rt shoulder/neck    Joint Mobilization CPA and Rt UPA to cspine Gd II/III    Soft tissue mobilization Rt periscapular musculature;  UT, scalenes, levator, forearm    Myofascial Release to Rt UT and Levator scapula; pecs     Scapular Mobilization in supine inf/sup/pro/retraction    Passive ROM to right shoulder within protocol limits, ER/flex/ABD                    PT Short Term Goals - 10/27/19 1213      PT SHORT TERM GOAL #1   Title Ind with initial HEP    Time 2    Period Weeks    Status New    Target Date 11/10/19      PT SHORT TERM GOAL #2   Title PROM to protocol goals by 6 weeks (11/28/19)    Time 4    Period Weeks    Status New      PT SHORT TERM GOAL #3   Title Patient ind in [facility] HEP for right shoulder    Time 8  Period Weeks    Status New    Target Date 12/22/19      PT SHORT TERM GOAL #4   Title Pt to demo full AROM of right elbow    Time 8    Period Weeks    Status New                    Plan - 11/07/19 0849    Rehab Potential Excellent    PT Frequency 2x / week    PT Duration 8 weeks    PT Treatment/Interventions ADLs/Self Care Home Management;Cryotherapy;Electrical Stimulation;Iontophoresis 4mg /ml Dexamethasone;Moist Heat;Therapeutic exercise;Therapeutic activities;Neuromuscular re-education;Patient/family education;Manual techniques;Taping;Dry needling;Passive range of motion;Vasopneumatic Device;Joint Manipulations;Spinal Manipulations    PT Next Visit Plan Review HEP; elbow ROM; PROM per protocol until 11/28/19  Per MD: flex to 140 deg, ER at side 40 deg; ABD max 60-80 without rotation; heat before/ice after    PT Home Exercise Plan [id number]           Patient will benefit from skilled therapeutic intervention in order to improve the following deficits and impairments:     Visit Diagnosis: Stiffness of right shoulder, not elsewhere  classified  Acute pain of right shoulder  Localized edema  Muscle weakness (generalized)     Problem List Patient Active Problem List   Diagnosis Date Noted  . Impingement syndrome of right shoulder 03/28/2019  . Primary osteoarthritis of both first carpometacarpal joints 07/29/2018  . Tendonitis of both wrists 06/01/2018  . Numbness and tingling in both hands 06/01/2018  . Eustachian tube dysfunction, bilateral 08/17/2017  . Angio-edema 08/10/2017  . Environmental allergies 07/21/2017  . Seasonal allergies 07/21/2017  . Seasonal allergic rhinitis 07/09/2017  . Post-nasal drip 07/09/2017  . 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 Trapezius muscle spasm 08/21/2016  . Vitamin D deficiency 08/01/2014  . B12 deficiency 08/01/2014  . Prolonged Q-T interval on ECG 10/06/2013  . Major depressive disorder, recurrent episode, moderate (HCC) 12/27/2012  . Generalized anxiety disorder 12/27/2012  . Anxiety 04/27/2012  . Essential hypertension 04/27/2012  . Major depressive disorder 03/31/2012  . Chest pain 03/31/2012  . Shortness of breath 03/31/2012  . Migraine 03/31/2012  . PALPITATIONS 08/02/2009    Darlene Ochoa 08/04/2009 PT, MPH  11/07/2019, 9:27 AM  [facility] [address] [place], [place], [place] Phone: [phone]   Fax:  [phone]  Name: Darlene Ochoa MRN: [name] Date of Birth: [birth year]

## 2019-11-10 ENCOUNTER — Ambulatory Visit: Payer: BC Managed Care – PPO | Admitting: Physical Therapy

## 2019-11-10 ENCOUNTER — Encounter: Payer: BC Managed Care – PPO | Admitting: Physical Therapy

## 2019-11-10 ENCOUNTER — Other Ambulatory Visit: Payer: Self-pay

## 2019-11-10 DIAGNOSIS — M25611 Stiffness of right shoulder, not elsewhere classified: Secondary | ICD-10-CM

## 2019-11-10 DIAGNOSIS — M25511 Pain in right shoulder: Secondary | ICD-10-CM | POA: Diagnosis not present

## 2019-11-10 DIAGNOSIS — M6281 Muscle weakness (generalized): Secondary | ICD-10-CM

## 2019-11-10 DIAGNOSIS — R600 Localized edema: Secondary | ICD-10-CM | POA: Diagnosis not present

## 2019-11-10 NOTE — Therapy (Signed)
[facility] [place] [address] [place], [place], [address] Phone: [phone]   Fax:  [phone]  Physical Therapy Treatment  Patient Details  Name: Darlene Ochoa MRN: [id number] Date of Birth: [birth year] Referring Provider (PT): Ramond Marrow, MD   Encounter Date: 11/10/2019   PT End of Session - 11/10/19 1108    Visit Number 4    Number of Visits 16    Date for PT Re-Evaluation 12/22/19    Authorization Type BCBS    PT Start Time 1105    PT Stop Time 1151    PT Time Calculation (min) 46 min    Activity Tolerance Patient tolerated treatment well    Behavior During Therapy [facility] for tasks assessed/performed           Past Medical History:  Diagnosis Date  . Anxiety   . Chest pain 03/31/2012  . Depression   . H/O prolonged Q-T interval on ECG 03/31/2012  . Hypertension   . Major depressive disorder 03/31/2012  . Seasonal allergies     Past Surgical History:  Procedure Laterality Date  . CESAREAN SECTION  1998  . ROTATOR CUFF REPAIR Right 2021    There were no vitals filed for this visit.   Subjective Assessment - 11/10/19 1108    Subjective Pt reports she is sleeping better.  Otherwise no new changes.    Patient Stated Goals New grandbaby coming in Oct so to hurry up and be well    Currently in Pain? No/denies    Pain Score 0-No pain              [facility] PT Assessment - 11/10/19 0001      Assessment   Medical Diagnosis Rt RCR/DCE/SAD    Referring Provider (PT) Ramond Marrow, MD    Onset Date/Surgical Date 10/17/19    Hand Dominance Right    Next MD Visit none scheduled            [facility] Adult PT Treatment/Exercise - 11/10/19 0001      Self-Care   Self-Care Other Self-Care Comments    Other Self-Care Comments  Pt educated on self massage with ball to periscapular and pec musculature to decrease tightness.  demo provided. Pt verbalized understanding.       Elbow Exercises   Elbow Flexion AROM;Right;10  reps;Supine      Shoulder Exercises: Seated   Other Seated Exercises shoulder rolls with Rt arm in neutral x5 reps each direction.   scap squeeze x 5 sec x 10 reps.  Lt lateral neck flexion for upper trap stretch x 10 sec x 2 reps       Shoulder Exercises: Standing   Other Standing Exercises elbow flex/ext; supination/pronation; fisting Rt UE x 5 reps each     Other Standing Exercises Rt shoulder pendulum x 30 sec       Vasopneumatic   Number Minutes Vasopneumatic  10 minutes    Vasopnuematic Location  Shoulder    Vasopneumatic Pressure Medium    Vasopneumatic Temperature  34 deg      Manual Therapy   Soft tissue mobilization IASTM and STM to Rt wrist flexors, bicep to decrease fascial restrictions and improve ROM.    Myofascial Release to Rt UT and Levator scapula; pecs     Scapular Mobilization in supine inf/sup/pro/retraction    Passive ROM to right shoulder within protocol limits, ER/flex/ABD  PT Short Term Goals - 10/27/19 1213      PT SHORT TERM GOAL #1   Title Ind with initial HEP    Time 2    Period Weeks    Status New    Target Date 11/10/19      PT SHORT TERM GOAL #2   Title PROM to protocol goals by 6 weeks (11/28/19)    Time 4    Period Weeks    Status New      PT SHORT TERM GOAL #3   Title Patient ind in [place] HEP for right shoulder    Time 8    Period Weeks    Status New    Target Date 12/22/19      PT SHORT TERM GOAL #4   Title Pt to demo full AROM of right elbow    Time 8    Period Weeks    Status New                    Plan - 11/10/19 1237    Clinical Impression Statement Pt becoming less guarded with PROM of Rt shoulder.  Rt shoulder able to get to neutral today. PROM performed within protocol limits and no pain.    Rehab Potential Excellent    PT Frequency 2x / week    PT Duration 8 weeks    PT Treatment/Interventions ADLs/Self Care Home Management;Cryotherapy;Electrical Stimulation;Iontophoresis 4mg /ml  Dexamethasone;Moist Heat;Therapeutic exercise;Therapeutic activities;Neuromuscular re-education;Patient/family education;Manual techniques;Taping;Dry needling;Passive range of motion;Vasopneumatic Device;Joint Manipulations;Spinal Manipulations    PT Next Visit Plan Review HEP; elbow ROM; PROM per protocol until 11/28/19  Per MD: flex to 140 deg, ER at side 40 deg; ABD max 60-80 without rotation; heat before/ice after    PT Home Exercise Plan [email or web address]    Consulted and Agree with Plan of Care Patient           Patient will benefit from skilled therapeutic intervention in order to improve the following deficits and impairments:  Decreased range of motion, Increased edema, Impaired UE functional use, Impaired flexibility, Decreased strength, Pain, Postural dysfunction  Visit Diagnosis: Stiffness of right shoulder, not elsewhere classified  Acute pain of right shoulder  Localized edema  Muscle weakness (generalized)     Problem List Patient Active Problem List   Diagnosis Date Noted  . Impingement syndrome of right shoulder 03/28/2019  . Primary osteoarthritis of both first carpometacarpal joints 07/29/2018  . Tendonitis of both wrists 06/01/2018  . Numbness and tingling in both hands 06/01/2018  . Eustachian tube dysfunction, bilateral 08/17/2017  . Angio-edema 08/10/2017  . Environmental allergies 07/21/2017  . Seasonal allergies 07/21/2017  . Seasonal allergic rhinitis 07/09/2017  . Post-nasal drip 07/09/2017  . 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 Trapezius muscle spasm 08/21/2016  . Vitamin D deficiency 08/01/2014  . B12 deficiency 08/01/2014  . Prolonged Q-T interval on ECG 10/06/2013  . Major depressive disorder, recurrent episode, moderate (HCC) 12/27/2012  . Generalized anxiety disorder 12/27/2012  . Anxiety 04/27/2012  . Essential hypertension 04/27/2012  . Major depressive disorder 03/31/2012  . Chest pain 03/31/2012  . Shortness of breath 03/31/2012  . Migraine  03/31/2012  . PALPITATIONS 08/02/2009   08/04/2009, PTA 11/10/19 12:40 PM  [facility] [place] [address] [place], [place], [place] Phone: [phone]   Fax:  [phone]  Name: Darlene Ochoa MRN: [name] Date of Birth: 1965/05/24

## 2019-11-14 ENCOUNTER — Ambulatory Visit: Payer: BC Managed Care – PPO | Admitting: Rehabilitative and Restorative Service Providers"

## 2019-11-14 ENCOUNTER — Encounter: Payer: Self-pay | Admitting: Rehabilitative and Restorative Service Providers"

## 2019-11-14 ENCOUNTER — Other Ambulatory Visit: Payer: Self-pay

## 2019-11-14 DIAGNOSIS — M25511 Pain in right shoulder: Secondary | ICD-10-CM | POA: Diagnosis not present

## 2019-11-14 DIAGNOSIS — R600 Localized edema: Secondary | ICD-10-CM

## 2019-11-14 DIAGNOSIS — M6281 Muscle weakness (generalized): Secondary | ICD-10-CM

## 2019-11-14 DIAGNOSIS — M25611 Stiffness of right shoulder, not elsewhere classified: Secondary | ICD-10-CM | POA: Diagnosis not present

## 2019-11-14 NOTE — Therapy (Signed)
[facility] [place] [address] [place], [place], [address] Phone: [phone]   Fax:  [phone]  Physical Therapy Treatment  Patient Details  Name: Darlene Ochoa MRN: [id number] Date of Birth: 14-Jan-1966 Referring Provider (PT): Ramond Marrow, MD   Encounter Date: 11/14/2019   PT End of Session - 11/14/19 0852    Visit Number 5    Number of Visits 16    Date for PT Re-Evaluation 12/22/19    PT Start Time 0845    PT Stop Time 0933    PT Time Calculation (min) 48 min    Activity Tolerance Patient tolerated treatment well           Past Medical History:  Diagnosis Date   Anxiety    Chest pain 03/31/2012   Depression    H/O prolonged Q-T interval on ECG 03/31/2012   Hypertension    Major depressive disorder 03/31/2012   Seasonal allergies     Past Surgical History:  Procedure Laterality Date   CESAREAN SECTION  1998   ROTATOR CUFF REPAIR Right 2021    There were no vitals filed for this visit.   Subjective Assessment - 11/14/19 0853    Subjective Doing well - shoulder feels tight but not painful. Working on her exercises at home.    Currently in Pain? No/denies              [facility] PT Assessment - 11/14/19 0001      Assessment   Medical Diagnosis Rt RCR/DCE/SAD    Referring Provider (PT) Ramond Marrow, MD    Onset Date/Surgical Date 10/17/19    Hand Dominance Right    Next MD Visit none scheduled      PROM   Right Shoulder Flexion 140 Degrees    Right Shoulder ABduction 75 Degrees    Right Shoulder External Rotation 5 Degrees   in scapular plane    Right Elbow Extension 0      Palpation   Palpation comment muscular tightness Rt upper quarter - pecs; upper trap; leveator; teres; biceps; triceps                          [facility] Adult PT Treatment/Exercise - 11/14/19 0001      Shoulder Exercises: Standing   Retraction AROM;Strengthening;Both;10 reps   10 sec hold scap squeeze with  noodle    Other Standing Exercises elbow flex/ext; supination/pronation; fisting Rt UE x 10 reps each       Shoulder Exercises: ROM/Strengthening   Pendulum side to side; fwd/back; circles CW/CCW x 10-15 each pt resting Lt hand on table flexed forw at 90 deg at hips       Vasopneumatic   Number Minutes Vasopneumatic  15 minutes    Vasopnuematic Location  Shoulder    Vasopneumatic Pressure Medium    Vasopneumatic Temperature  34 deg      Manual Therapy   Manual Therapy Soft tissue mobilization;Myofascial release;Passive ROM;Scapular mobilization;Joint mobilization    Soft tissue mobilization working thorugh periscapular musculature; pecs; teres; upper trap; clavicular area; bicpes; triceps Rt UE     Myofascial Release to Rt UT and Levator scapula; pecs     Scapular Mobilization in supine inf/sup/pro/retraction    Passive ROM to right shoulder within protocol limits, ER/flex/ABD                    PT Short Term Goals - 10/27/19 1213  PT SHORT TERM GOAL #1   Title Ind with initial HEP    Time 2    Period Weeks    Status New    Target Date 11/10/19      PT SHORT TERM GOAL #2   Title PROM to protocol goals by 6 weeks (11/28/19)    Time 4    Period Weeks    Status New      PT SHORT TERM GOAL #3   Title Patient ind in [place] HEP for right shoulder    Time 8    Period Weeks    Status New    Target Date 12/22/19      PT SHORT TERM GOAL #4   Title Pt to demo full AROM of right elbow    Time 8    Period Weeks    Status New                    Plan - 11/14/19 3428    Clinical Impression Statement Doing well with HEP and resting okay at night. Continue with exercise and PROM per protocol. Noted increased muscular tightness with PROM today.    Rehab Potential Excellent    PT Frequency 2x / week    PT Duration 8 weeks    PT Treatment/Interventions ADLs/Self Care Home Management;Cryotherapy;Electrical Stimulation;Iontophoresis 4mg /ml Dexamethasone;Moist  Heat;Therapeutic exercise;Therapeutic activities;Neuromuscular re-education;Patient/family education;Manual techniques;Taping;Dry needling;Passive range of motion;Vasopneumatic Device;Joint Manipulations;Spinal Manipulations    PT Next Visit Plan Review HEP; elbow ROM; PROM per protocol until 11/28/19  Per MD: flex to 140 deg, ER at side 40 deg; ABD max 60-80 without rotation; heat before/ice after    PT Home Exercise Plan [email or web address]    Consulted and Agree with Plan of Care Patient           Patient will benefit from skilled therapeutic intervention in order to improve the following deficits and impairments:     Visit Diagnosis: Stiffness of right shoulder, not elsewhere classified  Acute pain of right shoulder  Localized edema  Muscle weakness (generalized)     Problem List Patient Active Problem List   Diagnosis Date Noted   Impingement syndrome of right shoulder 03/28/2019   Primary osteoarthritis of both first carpometacarpal joints 07/29/2018   Tendonitis of both wrists 06/01/2018   Numbness and tingling in both hands 06/01/2018   Eustachian tube dysfunction, bilateral 08/17/2017   Angio-edema 08/10/2017   Environmental allergies 07/21/2017   Seasonal allergies 07/21/2017   Seasonal allergic rhinitis 07/09/2017   Post-nasal drip 07/09/2017   Submental mass 12/07/2016   Trapezius muscle spasm 08/21/2016   Vitamin D deficiency 08/01/2014   B12 deficiency 08/01/2014   Prolonged Q-T interval on ECG 10/06/2013   Major depressive disorder, recurrent episode, moderate (HCC) 12/27/2012   Generalized anxiety disorder 12/27/2012   Anxiety 04/27/2012   Essential hypertension 04/27/2012   Major depressive disorder 03/31/2012   Chest pain 03/31/2012   Shortness of breath 03/31/2012   Migraine 03/31/2012   PALPITATIONS 08/02/2009    Darlene Ochoa 08/04/2009 PT, MPH  11/14/2019, 9:30 AM  [facility] [address] [place], [place], [place] Phone: [phone]   Fax:  [phone]  Name: Darlene Ochoa MRN: Darlene Ochoa Date of Birth: 1966-03-23

## 2019-11-17 ENCOUNTER — Ambulatory Visit: Payer: BC Managed Care – PPO | Admitting: Rehabilitative and Restorative Service Providers"

## 2019-11-17 ENCOUNTER — Other Ambulatory Visit: Payer: Self-pay

## 2019-11-17 ENCOUNTER — Encounter: Payer: Self-pay | Admitting: Rehabilitative and Restorative Service Providers"

## 2019-11-17 DIAGNOSIS — R600 Localized edema: Secondary | ICD-10-CM | POA: Diagnosis not present

## 2019-11-17 DIAGNOSIS — M25611 Stiffness of right shoulder, not elsewhere classified: Secondary | ICD-10-CM | POA: Diagnosis not present

## 2019-11-17 DIAGNOSIS — M25511 Pain in right shoulder: Secondary | ICD-10-CM | POA: Diagnosis not present

## 2019-11-17 DIAGNOSIS — M6281 Muscle weakness (generalized): Secondary | ICD-10-CM | POA: Diagnosis not present

## 2019-11-17 NOTE — Therapy (Signed)
[facility] [place] [address] [place], [place], [address] Phone: [phone]   Fax:  [phone]  Physical Therapy Treatment  Patient Details  Name: Darlene Ochoa MRN: [id number] Date of Birth: 27-Jun-1965 Referring Provider (PT): Ramond Marrow, MD   Encounter Date: 11/17/2019   PT End of Session - 11/17/19 0855    Visit Number 6    Number of Visits 16    Date for PT Re-Evaluation 12/22/19    Authorization Type BCBS    PT Start Time 0848    PT Stop Time 0940    PT Time Calculation (min) 52 min    Activity Tolerance Patient tolerated treatment well           Past Medical History:  Diagnosis Date   Anxiety    Chest pain 03/31/2012   Depression    H/O prolonged Q-T interval on ECG 03/31/2012   Hypertension    Major depressive disorder 03/31/2012   Seasonal allergies     Past Surgical History:  Procedure Laterality Date   CESAREAN SECTION  1998   ROTATOR CUFF REPAIR Right 2021    There were no vitals filed for this visit.   Subjective Assessment - 11/17/19 0855    Subjective Shoulder is stiff and sore today. She had to separate her dogs that were fighting yesterday. Arm is very sore and tight today.    Currently in Pain? Yes    Pain Score 4     Pain Location Shoulder    Pain Orientation Right    Pain Descriptors / Indicators Sore;Tightness    Pain Type Acute pain;Surgical pain    Pain Onset More than a month ago    Pain Frequency Intermittent              [facility] PT Assessment - 11/17/19 0001      Assessment   Medical Diagnosis Rt RCR/DCE/SAD    Referring Provider (PT) Ramond Marrow, MD    Onset Date/Surgical Date 10/17/19    Hand Dominance Right    Next MD Visit 11/28/19      PROM   Right Shoulder Flexion 140 Degrees    Right Shoulder ABduction 80 Degrees   in scapular plane    Right Shoulder External Rotation 14 Degrees   in scapular plane within tissue limits no pain                          [facility] Adult PT Treatment/Exercise - 11/17/19 0001      Shoulder Exercises: Standing   Retraction AROM;Strengthening;Both;10 reps   10 sec hold scap squeeze with noodle    Other Standing Exercises elbow flex/ext; supination/pronation; fisting Rt UE x 10 reps each       Shoulder Exercises: ROM/Strengthening   Pendulum side to side; fwd/back; circles CW/CCW x 20-30 each pt resting Lt hand on table flexed forw at 90 deg at hips       Shoulder Exercises: Stretch   External Rotation Stretch --   with cane elbow at side 90 deg flex 10 reps x 10 sec    Table Stretch - Flexion --   10 reps x 10 sec hold sitting facing table      Electrical Stimulation   Electrical Stimulation Location Rt shoulder girdle     Electrical Stimulation Action TENS    Electrical Stimulation Parameters to tolerance    Electrical Stimulation Goals Tone;Pain      Vasopneumatic  Number Minutes Vasopneumatic  15 minutes    Vasopnuematic Location  Shoulder    Vasopneumatic Pressure Medium    Vasopneumatic Temperature  34 deg      Manual Therapy   Manual therapy comments I strip of sensitive skin tape placed over Rt shoulder ant incisions to assist with decompression of tissue, desensitization, and scar managment.     Soft tissue mobilization working thorugh periscapular musculature; pecs; teres; upper trap; clavicular area; bicpes; triceps Rt UE     Myofascial Release to Rt UT and Levator scapula; pecs     Scapular Mobilization in supine inf/sup/pro/retraction    Passive ROM to right shoulder within protocol limits, ER/flex/ABD                  PT Education - 11/17/19 0913    Education Details HEP TENS    Person(s) Educated Patient            PT Short Term Goals - 10/27/19 1213      PT SHORT TERM GOAL #1   Title Ind with initial HEP    Time 2    Period Weeks    Status New    Target Date 11/10/19      PT SHORT TERM GOAL #2   Title PROM to protocol goals by 6 weeks  (11/28/19)    Time 4    Period Weeks    Status New      PT SHORT TERM GOAL #3   Title Patient ind in [place] HEP for right shoulder    Time 8    Period Weeks    Status New    Target Date 12/22/19      PT SHORT TERM GOAL #4   Title Pt to demo full AROM of right elbow    Time 8    Period Weeks    Status New                    Plan - 11/17/19 4128    Clinical Impression Statement Increased tightness and discomfort Rt shoulder after separating fighting dogs last night. Added ER with cane and table slide for PROM. Continued with manual work and PROM by PT. Note muscle guarding and tightness in the Rt shoulder and shoulder girdle. Trial of TENS for pain management at home. Trial of kinesotaping.    Rehab Potential Excellent    PT Frequency 2x / week    PT Duration 8 weeks    PT Treatment/Interventions ADLs/Self Care Home Management;Cryotherapy;Electrical Stimulation;Iontophoresis 4mg /ml Dexamethasone;Moist Heat;Therapeutic exercise;Therapeutic activities;Neuromuscular re-education;Patient/family education;Manual techniques;Taping;Dry needling;Passive range of motion;Vasopneumatic Device;Joint Manipulations;Spinal Manipulations    PT Next Visit Plan Review HEP; elbow ROM; PROM per protocol until 11/28/19  Per MD: flex to 140 deg, ER at side 40 deg; ABD max 60-80 without rotation; heat before/ice after - assess response to trial of TENS unit    PT Home Exercise Plan [email or web address]    Consulted and Agree with Plan of Care Patient           Patient will benefit from skilled therapeutic intervention in order to improve the following deficits and impairments:     Visit Diagnosis: Stiffness of right shoulder, not elsewhere classified  Acute pain of right shoulder  Localized edema  Muscle weakness (generalized)     Problem List Patient Active Problem List   Diagnosis Date Noted   Impingement syndrome of right shoulder 03/28/2019   Primary osteoarthritis of both first  carpometacarpal joints 07/29/2018  Tendonitis of both wrists 06/01/2018   Numbness and tingling in both hands 06/01/2018   Eustachian tube dysfunction, bilateral 08/17/2017   Angio-edema 08/10/2017   Environmental allergies 07/21/2017   Seasonal allergies 07/21/2017   Seasonal allergic rhinitis 07/09/2017   Post-nasal drip 07/09/2017   Submental mass 12/07/2016   Trapezius muscle spasm 08/21/2016   Vitamin D deficiency 08/01/2014   B12 deficiency 08/01/2014   Prolonged Q-T interval on ECG 10/06/2013   Major depressive disorder, recurrent episode, moderate (HCC) 12/27/2012   Generalized anxiety disorder 12/27/2012   Anxiety 04/27/2012   Essential hypertension 04/27/2012   Major depressive disorder 03/31/2012   Chest pain 03/31/2012   Shortness of breath 03/31/2012   Migraine 03/31/2012   PALPITATIONS 08/02/2009    Sayan Aldava Rober Minion PT, MPH  11/17/2019, 12:15 PM  [facility] [address] [place], [place], [address] Phone: [phone]   Fax:  [phone]  Name: Mechelle Pates MRN: [id number] Date of Birth: Jun 23, 1965

## 2019-11-17 NOTE — Patient Instructions (Signed)
Access Code: [email or web address]: [email or web address]: 07/29/2021Prepared by: Tamasha Laplante HoltExercises  Seated Finger Composite Flexion Extension - 2 x daily - 7 x weekly - 3 sets - 15 reps  Wrist AROM Flexion Extension - 2 x daily - 7 x weekly - 3 sets - 15 reps  Seated Elbow Flexion Extension AROM - 2 x daily - 7 x weekly - 3 sets - 15 reps  Circular Shoulder Pendulum with Table Support - 2 x daily - 7 x weekly - 3 sets - 15 reps  Flexion-Extension Shoulder Pendulum with Table Support - 2 x daily - 7 x weekly - 3 sets - 15 reps  Horizontal Shoulder Pendulum with Table Support - 2 x daily - 7 x weekly - 3 sets - 15 reps  Seated Upper Trapezius Stretch - 2 x daily - 7 x weekly - 1 sets - 3 reps - 30 sec hold  Seated Scapular Retraction - 2 x daily - 7 x weekly - 1-3 sets - 10 reps  Seated Shoulder External Rotation AAROM with Cane and Hand in Neutral - 3-4 x daily - 7 x weekly - 1 sets - 5-10 reps - 10 sec hold  Seated Shoulder Flexion Towel Slide at Table Top - 3-4 x daily - 7 x weekly - 1 sets - 5-10 reps - 10 sec hold Patient Education  TENS Unit

## 2019-11-18 DIAGNOSIS — F411 Generalized anxiety disorder: Secondary | ICD-10-CM | POA: Diagnosis not present

## 2019-11-21 ENCOUNTER — Other Ambulatory Visit: Payer: Self-pay

## 2019-11-21 ENCOUNTER — Ambulatory Visit (INDEPENDENT_AMBULATORY_CARE_PROVIDER_SITE_OTHER): Payer: BC Managed Care – PPO | Admitting: Physical Therapy

## 2019-11-21 DIAGNOSIS — M25611 Stiffness of right shoulder, not elsewhere classified: Secondary | ICD-10-CM

## 2019-11-21 DIAGNOSIS — R600 Localized edema: Secondary | ICD-10-CM

## 2019-11-21 DIAGNOSIS — M6281 Muscle weakness (generalized): Secondary | ICD-10-CM | POA: Diagnosis not present

## 2019-11-21 DIAGNOSIS — M25511 Pain in right shoulder: Secondary | ICD-10-CM

## 2019-11-21 NOTE — Therapy (Signed)
[facility] [place] [address] [place], [place], [address] Phone: [phone]   Fax:  [phone]  Physical Therapy Treatment  Patient Details  Name: Darlene Ochoa MRN: [id number] Date of Birth: 04-Aug-1965 Referring Provider (PT): Ramond Marrow, MD   Encounter Date: 11/21/2019   PT End of Session - 11/21/19 0854    Visit Number 7    Number of Visits 16    Date for PT Re-Evaluation 12/22/19    Authorization Type BCBS    PT Start Time 0846    PT Stop Time 0934    PT Time Calculation (min) 48 min    Activity Tolerance Patient tolerated treatment well           Past Medical History:  Diagnosis Date  . Anxiety   . Chest pain 03/31/2012  . Depression   . H/O prolonged Q-T interval on ECG 03/31/2012  . Hypertension   . Major depressive disorder 03/31/2012  . Seasonal allergies     Past Surgical History:  Procedure Laterality Date  . CESAREAN SECTION  1998  . ROTATOR CUFF REPAIR Right 2021    There were no vitals filed for this visit.   Subjective Assessment - 11/21/19 0854    Subjective Pt reports the tape on her shoulder felt good.  She has been stretching morning, noon, and night to keep it loose. She reports increased tightness in Rt shoulder this morning.    Patient Stated Goals New grandbaby coming in Oct so to hurry up and be well    Currently in Pain? Yes    Pain Score 4     Pain Location Shoulder    Pain Orientation Right    Pain Descriptors / Indicators Tightness;Sore    Pain Relieving Factors ice              [facility] PT Assessment - 11/21/19 0001      Assessment   Medical Diagnosis Rt RCR/DCE/SAD    Referring Provider (PT) Ramond Marrow, MD    Onset Date/Surgical Date 10/17/19    Hand Dominance Right    Next MD Visit 11/28/19            [facility] Adult PT Treatment/Exercise - 11/21/19 0001      Elbow Exercises   Elbow Flexion AROM;Right;10 reps;Seated      Shoulder Exercises: Seated   Other Seated  Exercises shoulder rolls with Rt arm in neutral x5 reps each direction.   scap squeeze x 5 sec x 10 reps.  Lt lateral neck flexion for upper trap stretch x 10 sec x 2 reps       Shoulder Exercises: Stretch   Table Stretch - Flexion --   5 reps, hold 10 seconds   Table Stretch -Flexion Limitations initially not tolerated; improved after PROM with therapist.     Other Shoulder Stretches standing Rt wrist ext stretch with arm in neutral and fingers on counter, to tolerance x 15 sec, 2 reps;  seated Rt wrist flex/ext stretch with arm supported on table x 15 sec x 2 reps each direction      Electrical Stimulation   Electrical Stimulation Location Rt shoulder girdle     Electrical Stimulation Action IFC    Electrical Stimulation Parameters intensity to tolerance, 10 min      Electrical Stimulation Goals Pain;Tone      Vasopneumatic   Number Minutes Vasopneumatic  10 minutes    Vasopnuematic Location  Shoulder   Rt    Vasopneumatic  Pressure Low    Vasopneumatic Temperature  34 deg      Manual Therapy   Manual therapy comments pt in supine and Lt sidelying     Soft tissue mobilization STM to Rt wrist flexors, extensors, bicep, upper trap, thoracic paraspinals, rhomboid, levator     Myofascial Release --    Scapular Mobilization in supine inf/sup/pro/retraction    Passive ROM to right shoulder within protocol limits, ER/flex/ABD                    PT Short Term Goals - 11/21/19 1501      PT SHORT TERM GOAL #1   Title Ind with initial HEP    Time 2    Period Weeks    Status On-going    Target Date 11/10/19      PT SHORT TERM GOAL #2   Title PROM to protocol goals by 6 weeks (11/28/19)    Time 4    Period Weeks    Status On-going      PT SHORT TERM GOAL #3   Title Patient ind in [place] HEP for right shoulder    Time 8    Period Weeks    Status On-going    Target Date 12/22/19      PT SHORT TERM GOAL #4   Title Pt to demo full AROM of right elbow    Time 8    Period  Weeks    Status On-going                    Plan - 11/21/19 1456    Clinical Impression Statement Continued tightness and discomfort in Rt shoulder.  Rt shoulder very tight into IR; with time and patience during PROM, able to achieve ~5-8 deg ER.  Some muscle guarding, but improving from previous sessions. Progressing towards all therapy goals.    Comorbidities anxiety, depression, bil thumb pain    Rehab Potential Excellent    PT Frequency 2x / week    PT Duration 8 weeks    PT Treatment/Interventions ADLs/Self Care Home Management;Cryotherapy;Electrical Stimulation;Iontophoresis 4mg /ml Dexamethasone;Moist Heat;Therapeutic exercise;Therapeutic activities;Neuromuscular re-education;Patient/family education;Manual techniques;Taping;Dry needling;Passive range of motion;Vasopneumatic Device;Joint Manipulations;Spinal Manipulations    PT Next Visit Plan Review HEP; elbow ROM; PROM per protocol until 11/28/19  Per MD: flex to 140 deg, ER at side 40 deg; ABD max 60-80 without rotation; heat before/ice after.    PT Home Exercise Plan [email or web address]    Consulted and Agree with Plan of Care Patient           Patient will benefit from skilled therapeutic intervention in order to improve the following deficits and impairments:  Decreased range of motion, Increased edema, Impaired UE functional use, Impaired flexibility, Decreased strength, Pain, Postural dysfunction  Visit Diagnosis: Stiffness of right shoulder, not elsewhere classified  Acute pain of right shoulder  Localized edema  Muscle weakness (generalized)     Problem List Patient Active Problem List   Diagnosis Date Noted  . Impingement syndrome of right shoulder 03/28/2019  . Primary osteoarthritis of both first carpometacarpal joints 07/29/2018  . Tendonitis of both wrists 06/01/2018  . Numbness and tingling in both hands 06/01/2018  . Eustachian tube dysfunction, bilateral 08/17/2017  . Angio-edema 08/10/2017  .  Environmental allergies 07/21/2017  . Seasonal allergies 07/21/2017  . Seasonal allergic rhinitis 07/09/2017  . Post-nasal drip 07/09/2017  . 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 Trapezius muscle spasm 08/21/2016  . Vitamin D deficiency 08/01/2014  .  B12 deficiency 08/01/2014  . Prolonged Q-T interval on ECG 10/06/2013  . Major depressive disorder, recurrent episode, moderate (HCC) 12/27/2012  . Generalized anxiety disorder 12/27/2012  . Anxiety 04/27/2012  . Essential hypertension 04/27/2012  . Major depressive disorder 03/31/2012  . Chest pain 03/31/2012  . Shortness of breath 03/31/2012  . Migraine 03/31/2012  . PALPITATIONS 08/02/2009   Mayer Camel, PTA 11/21/19 3:03 PM  [facility] [place] [address] [place], [place], [address] Phone: [phone]   Fax:  [phone]  Name: Rorie Delmore MRN: [id number] Date of Birth: [birth year]

## 2019-11-24 ENCOUNTER — Ambulatory Visit: Payer: BC Managed Care – PPO | Admitting: Physical Therapy

## 2019-11-24 ENCOUNTER — Other Ambulatory Visit: Payer: Self-pay

## 2019-11-24 DIAGNOSIS — M25611 Stiffness of right shoulder, not elsewhere classified: Secondary | ICD-10-CM | POA: Diagnosis not present

## 2019-11-24 DIAGNOSIS — M25511 Pain in right shoulder: Secondary | ICD-10-CM

## 2019-11-24 DIAGNOSIS — M6281 Muscle weakness (generalized): Secondary | ICD-10-CM

## 2019-11-24 DIAGNOSIS — R600 Localized edema: Secondary | ICD-10-CM | POA: Diagnosis not present

## 2019-11-24 NOTE — Therapy (Signed)
[place], [place], [address] Phone: [phone]   Fax:  [phone]  Physical Therapy Treatment  Patient Details  Name: Darlene Ochoa MRN: [id number] Date of Birth: [birth year] Referring Provider (PT): Ophelia Charter, MD   Encounter Date: 11/24/2019   PT End of Session - 11/24/19 0852    Visit Number 8    Number of Visits 16    Date for PT Re-Evaluation 12/22/19    Authorization Type BCBS    PT Start Time 0852    PT Stop Time 0946    PT Time Calculation (min) 54 min    Activity Tolerance Patient tolerated treatment well    Behavior During Therapy [facility] for tasks assessed/performed           Past Medical History:  Diagnosis Date  . Anxiety   . Chest pain 03/31/2012  . Depression   . H/O prolonged Q-T interval on ECG 03/31/2012  . Hypertension   . Major depressive disorder 03/31/2012  . Seasonal allergies     Past Surgical History:  Procedure Laterality Date  . CESAREAN SECTION  1998  . ROTATOR CUFF REPAIR Right 2021    There were no vitals filed for this visit.   Subjective Assessment - 11/24/19 0852    Subjective only hurts when I'm stretching    Pertinent History HTN, anxiety, depression    Patient Stated Goals New grandbaby coming in Oct so to hurry up and be well    Currently in Pain? No/denies              [facility] PT Assessment - 11/24/19 0001      AROM   Right Elbow Flexion 150    Right Elbow Extension 180      PROM   Right Shoulder Flexion 125 Degrees    Right Shoulder External Rotation 10 Degrees   scapular plane                        [facility] Adult PT Treatment/Exercise - 11/24/19 0001      Vasopneumatic   Number Minutes Vasopneumatic  10 minutes    Vasopnuematic Location  Shoulder    Vasopneumatic Pressure Low    Vasopneumatic Temperature  34 deg      Manual Therapy   Manual Therapy Joint mobilization;Passive ROM;Scapular mobilization    Joint  Mobilization GH jt mobs gd I/II for relaxation    Scapular Mobilization in supine all planes    Passive ROM to right shoulder, elbow and wrist            Trigger Point Dry Needling - 11/24/19 0001    Consent Given? Yes    Education Handout Provided Yes    Muscles Treated Wrist/Hand Opponens pollicis;Flexor pollicis brevis    Dry Needling Comments left hand    Opponens pollicis Response Twitch response elicited;Palpable increased muscle length    Flexor pollicis brevis Response Twitch response elicited;Palpable increased muscle length                  PT Short Term Goals - 11/24/19 0853      PT SHORT TERM GOAL #1   Title Ind with initial HEP    Status On-going      PT SHORT TERM GOAL #2   Title PROM to protocol goals by 6 weeks (11/28/19)    Baseline ABD met.    Status Partially Met      PT SHORT  TERM GOAL #4   Title Pt to demo full AROM of right elbow    Status Achieved                    Plan - 11/24/19 0944    Clinical Impression Statement Patient with less tightness and discomfort since last visit, but still not where she should be with flex and ER. She has full elbow ROM. We did a trial of DN to her left thumb today. Patient returns to MD 11/28/19.    Comorbidities anxiety, depression, bil thumb pain    PT Frequency 2x / week    PT Duration 8 weeks    PT Treatment/Interventions ADLs/Self Care Home Management;Cryotherapy;Electrical Stimulation;Iontophoresis 85m/ml Dexamethasone;Moist Heat;Therapeutic exercise;Therapeutic activities;Neuromuscular re-education;Patient/family education;Manual techniques;Taping;Dry needling;Passive range of motion;Vasopneumatic Device;Joint Manipulations;Spinal Manipulations    PT Next Visit Plan MD note;  PROM per protocol until 11/28/19  Per MD: flex to 140 deg, ER at side 40 deg; ABD max 60-80 without rotation; heat before/ice after.    PT Home Exercise Plan [email or web address]    Consulted and Agree with Plan of Care Patient             Patient will benefit from skilled therapeutic intervention in order to improve the following deficits and impairments:  Decreased range of motion, Increased edema, Impaired UE functional use, Impaired flexibility, Decreased strength, Pain, Postural dysfunction  Visit Diagnosis: Stiffness of right shoulder, not elsewhere classified  Acute pain of right shoulder  Localized edema  Muscle weakness (generalized)     Problem List Patient Active Problem List   Diagnosis Date Noted  . Impingement syndrome of right shoulder 03/28/2019  . Primary osteoarthritis of both first carpometacarpal joints 07/29/2018  . Tendonitis of both wrists 06/01/2018  . Numbness and tingling in both hands 06/01/2018  . Eustachian tube dysfunction, bilateral 08/17/2017  . Angio-edema 08/10/2017  . Environmental allergies 07/21/2017  . Seasonal allergies 07/21/2017  . Seasonal allergic rhinitis 07/09/2017  . Post-nasal drip 07/09/2017  . 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 Trapezius muscle spasm 08/21/2016  . Vitamin D deficiency 08/01/2014  . B12 deficiency 08/01/2014  . Prolonged Q-T interval on ECG 10/06/2013  . Major depressive disorder, recurrent episode, moderate ([place] 12/27/2012  . Generalized anxiety disorder 12/27/2012  . Anxiety 04/27/2012  . Essential hypertension 04/27/2012  . Major depressive disorder 03/31/2012  . Chest pain 03/31/2012  . Shortness of breath 03/31/2012  . Migraine 03/31/2012  . PALPITATIONS 08/02/2009    JMadelyn FlavorsPT 11/24/2019, 9:57 AM  [facility] [address]Phone: [phone]  Fax:  [phone] Name: Darlene NarineMRN: [id number] of Birth: 5September 17, 1967

## 2019-11-24 NOTE — Patient Instructions (Signed)

## 2019-11-28 ENCOUNTER — Other Ambulatory Visit: Payer: Self-pay

## 2019-11-28 ENCOUNTER — Ambulatory Visit: Payer: BC Managed Care – PPO | Admitting: Physical Therapy

## 2019-11-28 DIAGNOSIS — M25511 Pain in right shoulder: Secondary | ICD-10-CM | POA: Diagnosis not present

## 2019-11-28 DIAGNOSIS — M25611 Stiffness of right shoulder, not elsewhere classified: Secondary | ICD-10-CM | POA: Diagnosis not present

## 2019-11-28 DIAGNOSIS — M6281 Muscle weakness (generalized): Secondary | ICD-10-CM

## 2019-11-28 DIAGNOSIS — R600 Localized edema: Secondary | ICD-10-CM

## 2019-11-28 NOTE — Therapy (Signed)
[place], [place], [address] Phone: [phone]   Fax:  [phone]  Physical Therapy Treatment  Patient Details  Name: Darlene Ochoa MRN: [id number] Date of Birth: Apr 16, 1966 Referring Provider (PT): Ophelia Charter, MD   Encounter Date: 11/28/2019   PT End of Session - 11/28/19 0927    Visit Number 9    Number of Visits 16    Date for PT Re-Evaluation 12/22/19    Authorization Type BCBS    PT Start Time 0850    PT Stop Time 0934    PT Time Calculation (min) 44 min    Activity Tolerance Patient tolerated treatment well    Behavior During Therapy [facility] for tasks assessed/performed           Past Medical History:  Diagnosis Date  . Anxiety   . Chest pain 03/31/2012  . Depression   . H/O prolonged Q-T interval on ECG 03/31/2012  . Hypertension   . Major depressive disorder 03/31/2012  . Seasonal allergies     Past Surgical History:  Procedure Laterality Date  . CESAREAN SECTION  1998  . ROTATOR CUFF REPAIR Right 2021    There were no vitals filed for this visit.   Subjective Assessment - 11/28/19 1007    Subjective Pt reports no new changes since last visit.  She reports slight reduction in Lt thumb tightness since trial of DN.  She returns to surgeon tomorrow.    Pertinent History HTN, anxiety, depression    Patient Stated Goals New grandbaby coming in Oct so to hurry up and be well    Currently in Pain? No/denies    Pain Score 0-No pain    Pain Relieving Factors ice              [facility] PT Assessment - 11/28/19 0001      Assessment   Medical Diagnosis Rt RCR/DCE/SAD    Referring Provider (PT) Ophelia Charter, MD    Onset Date/Surgical Date 10/17/19    Hand Dominance Right    Next MD Visit 11/29/19      Observation/Other Assessments   Focus on Therapeutic Outcomes (FOTO)  50% limitation       PROM   Right/Left Shoulder Right    Right Shoulder Flexion 125 Degrees    Right Shoulder  ABduction 80 Degrees   in scapular plane   Right Shoulder External Rotation 20 Degrees   in scapular plane           [facility] Adult PT Treatment/Exercise - 11/28/19 0001      Shoulder Exercises: Supine   External Rotation Right;5 reps;AAROM   cane, arm in supported scaption   Flexion AAROM;Both;10 reps    Flexion Limitations chest press, then from 90 deg to 125 deg.       Shoulder Exercises: Standing   Internal Rotation AAROM;Both;10 reps   cane behind back   Extension AAROM;Both;10 reps   with cane   Row --      Shoulder Exercises: Pulleys   Flexion 3 minutes    Scaption 3 minutes      Shoulder Exercises: Stretch   Table Stretch - Flexion --   8 reps, hold 10 seconds   Table Stretch - External Rotation Limitations --   8 reps, 10 sec hold     Vasopneumatic   Number Minutes Vasopneumatic  10 minutes    Vasopnuematic Location  Shoulder    Vasopneumatic Pressure Low    Vasopneumatic  Temperature  34 deg      Manual Therapy   Passive ROM to Rt shoulder in scaption and ER.                   PT Education - 11/28/19 5631    Education Details HEP - updated with AAROM.    Person(s) Educated Patient    Methods Explanation;Handout;Demonstration;Verbal cues    Comprehension Returned demonstration;Verbalized understanding            PT Short Term Goals - 11/28/19 0957      PT SHORT TERM GOAL #1   Title Ind with initial HEP    Time 2    Period Weeks    Status Achieved    Target Date 11/10/19      PT SHORT TERM GOAL #2   Title PROM to protocol goals by 6 weeks (11/28/19)    Time 4    Period Weeks    Status Partially Met      PT SHORT TERM GOAL #3   Title Patient ind in [place] for right shoulder    Time 8    Period Weeks    Status On-going    Target Date 12/22/19      PT SHORT TERM GOAL #4   Title Pt to demo full AROM of right elbow    Time 8    Period Weeks    Status Achieved                    Plan - 11/28/19 4970    Clinical Impression  Statement Pt now 6 wks s/p RCR/DCE/SAD.  She was initially guarded with progression of exercises to AAROM (pulleys, cane), but reported decreased stiffness in Rt shoulder afterwards.  Modification of grip of cane needed due to bilat thumb pain. Rt shoulder PROM gradually progressing.  FOTO score improved to 50% limitation.  Progressing well towards STGs.    Comorbidities anxiety, depression, bil thumb pain    PT Frequency 2x / week    PT Duration 8 weeks    PT Treatment/Interventions ADLs/Self Care Home Management;Cryotherapy;Electrical Stimulation;Iontophoresis 33m/ml Dexamethasone;Moist Heat;Therapeutic exercise;Therapeutic activities;Neuromuscular re-education;Patient/family education;Manual techniques;Taping;Dry needling;Passive range of motion;Vasopneumatic Device;Joint Manipulations;Spinal Manipulations    PT Next Visit Plan Progress Rt shoulder exercises per rehab protocol. (no cuff isometrics until 8 wks)    PT Home Exercise Plan [email or web address]    Consulted and Agree with Plan of Care Patient           Patient will benefit from skilled therapeutic intervention in order to improve the following deficits and impairments:  Decreased range of motion, Increased edema, Impaired UE functional use, Impaired flexibility, Decreased strength, Pain, Postural dysfunction  Visit Diagnosis: Stiffness of right shoulder, not elsewhere classified  Acute pain of right shoulder  Localized edema  Muscle weakness (generalized)     Problem List Patient Active Problem List   Diagnosis Date Noted  . Impingement syndrome of right shoulder 03/28/2019  . Primary osteoarthritis of both first carpometacarpal joints 07/29/2018  . Tendonitis of both wrists 06/01/2018  . Numbness and tingling in both hands 06/01/2018  . Eustachian tube dysfunction, bilateral 08/17/2017  . Angio-edema 08/10/2017  . Environmental allergies 07/21/2017  . Seasonal allergies 07/21/2017  . Seasonal allergic rhinitis 07/09/2017  .  Post-nasal drip 07/09/2017  . 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 Trapezius muscle spasm 08/21/2016  . Vitamin D deficiency 08/01/2014  . B12 deficiency 08/01/2014  . Prolonged Q-T interval on ECG 10/06/2013  .  Major depressive disorder, recurrent episode, moderate ([place]) 12/27/2012  . Generalized anxiety disorder 12/27/2012  . Anxiety 04/27/2012  . Essential hypertension 04/27/2012  . Major depressive disorder 03/31/2012  . Chest pain 03/31/2012  . Shortness of breath 03/31/2012  . Migraine 03/31/2012  . PALPITATIONS 08/02/2009   Kerin Perna, PTA 11/28/19 10:14 AM  [place], [place], [address] Phone: [phone]   Fax:  [phone]  Name: Darlene Ochoa MRN: [id number] Date of Birth: January 03, 1966

## 2019-11-28 NOTE — Patient Instructions (Signed)
Access Code: [email or web address]: [email or web address]: 08/09/2021Prepared by: [facility] [place]Exercises  Seated Finger Composite Flexion Extension - 2 x daily - 7 x weekly - 3 sets - 15 reps  Wrist AROM Flexion Extension - 2 x daily - 7 x weekly - 3 sets - 15 reps  Seated Elbow Flexion Extension AROM - 2 x daily - 7 x weekly - 3 sets - 15 reps  Circular Shoulder Pendulum with Table Support - 2 x daily - 7 x weekly - 3 sets - 15 reps  Flexion-Extension Shoulder Pendulum with Table Support - 2 x daily - 7 x weekly - 3 sets - 15 reps  Horizontal Shoulder Pendulum with Table Support - 2 x daily - 7 x weekly - 3 sets - 15 reps  Seated Upper Trapezius Stretch - 2 x daily - 7 x weekly - 1 sets - 3 reps - 30 sec hold  Seated Scapular Retraction - 2 x daily - 7 x weekly - 1-3 sets - 10 reps  Seated Shoulder External Rotation AAROM with Cane and Hand in Neutral - 3-4 x daily - 7 x weekly - 1 sets - 5-10 reps - 10 sec hold  Seated Shoulder Flexion Towel Slide at Table Top - 3-4 x daily - 7 x weekly - 1 sets - 5-10 reps - 10 sec hold  Supine Shoulder Press with Dowel - 2 x daily - 7 x weekly - 1 sets - 10 reps  Supine Shoulder Flexion with Dowel - 2 x daily - 7 x weekly - 1 sets - 10 reps  Supine Shoulder External Rotation in 45 Degrees Abduction AAROM with Dowel - 2 x daily - 7 x weekly - 1 sets - 10 reps - 5-10 hold  Seated Shoulder Circles AAROM with Dowel into Wall - 2 x daily - 7 x weekly - 1 sets - 10 reps  Standing Bilateral Shoulder Internal Rotation AAROM with Dowel - 2 x daily - 7 x weekly - 1 sets - 10 reps  Standing Shoulder Extension with Dowel - 2 x daily - 7 x weekly - 1 sets - 10 reps

## 2019-11-29 DIAGNOSIS — M24111 Other articular cartilage disorders, right shoulder: Secondary | ICD-10-CM | POA: Diagnosis not present

## 2019-12-01 ENCOUNTER — Encounter: Payer: Self-pay | Admitting: Rehabilitative and Restorative Service Providers"

## 2019-12-01 ENCOUNTER — Other Ambulatory Visit: Payer: Self-pay

## 2019-12-01 ENCOUNTER — Ambulatory Visit: Payer: BC Managed Care – PPO | Admitting: Rehabilitative and Restorative Service Providers"

## 2019-12-01 DIAGNOSIS — M25611 Stiffness of right shoulder, not elsewhere classified: Secondary | ICD-10-CM | POA: Diagnosis not present

## 2019-12-01 DIAGNOSIS — M6281 Muscle weakness (generalized): Secondary | ICD-10-CM | POA: Diagnosis not present

## 2019-12-01 DIAGNOSIS — R600 Localized edema: Secondary | ICD-10-CM | POA: Diagnosis not present

## 2019-12-01 DIAGNOSIS — M25511 Pain in right shoulder: Secondary | ICD-10-CM

## 2019-12-01 NOTE — Therapy (Signed)
[address] [place], [place], [address] Phone: [phone]   Fax:  [phone]  Physical Therapy Treatment  Patient Details  Name: Darlene Ochoa MRN: [id number] Date of Birth: 21-Dec-1965 Referring Provider (PT): Ophelia Charter, MD   Encounter Date: 12/01/2019   PT End of Session - 12/01/19 0841    Visit Number 10    Number of Visits 16    Date for PT Re-Evaluation 12/22/19    Authorization Type BCBS    PT Start Time 0841    PT Stop Time 0926    PT Time Calculation (min) 45 min    Activity Tolerance Patient tolerated treatment well           Past Medical History:  Diagnosis Date  . Anxiety   . Chest pain 03/31/2012  . Depression   . H/O prolonged Q-T interval on ECG 03/31/2012  . Hypertension   . Major depressive disorder 03/31/2012  . Seasonal allergies     Past Surgical History:  Procedure Laterality Date  . CESAREAN SECTION  1998  . ROTATOR CUFF REPAIR Right 2021    There were no vitals filed for this visit.   Subjective Assessment - 12/01/19 0844    Subjective Patient reports that she fell in her kitchen last night and caught herself with her Rt UE. Arm is very sore today. Does not think she hurt the shoulder but she is really sore    Currently in Pain? Yes    Pain Score 4     Pain Location Shoulder    Pain Orientation Right    Pain Descriptors / Indicators Sore;Tightness    Pain Type Acute pain;Surgical pain                             [facility] Adult PT Treatment/Exercise - 12/01/19 0001      Shoulder Exercises: Supine   External Rotation Right;5 reps;AAROM   cane, arm in supported scaption   Flexion AAROM;Both;10 reps    Flexion Limitations chest press, then from 90 deg to 125 deg.       Shoulder Exercises: Standing   External Rotation AAROM;Right;10 reps   elbow at side cane assist to move into ER    Internal Rotation AAROM;Both;10 reps   cane behind back    Extension AAROM;Both;10 reps   with cane     Shoulder Exercises: Pulleys   Flexion 3 minutes    Scaption 3 minutes      Shoulder Exercises: ROM/Strengthening   Pendulum side to side; fwd/back; circles CW/CCW x 20-30 each pt resting Lt hand on table flexed forw at 90 deg at hips       Electrical Stimulation   Electrical Stimulation Location Rt shoulder girdle     Electrical Stimulation Action TENS    Electrical Stimulation Parameters to tolerance    Electrical Stimulation Goals Pain;Tone      Vasopneumatic   Number Minutes Vasopneumatic  10 minutes    Vasopnuematic Location  Shoulder    Vasopneumatic Pressure Low    Vasopneumatic Temperature  34 deg      Manual Therapy   Joint Mobilization GH jt mobs gd I/II for relaxation    Soft tissue mobilization STM to Rt bicep, upper trap, thoracic paraspinals, rhomboid, levator, teres    Scapular Mobilization in supine all planes    Passive ROM to Rt shoulder in flexion, scaption and ER.  PT Short Term Goals - 11/28/19 0957      PT SHORT TERM GOAL #1   Title Ind with initial HEP    Time 2    Period Weeks    Status Achieved    Target Date 11/10/19      PT SHORT TERM GOAL #2   Title PROM to protocol goals by 6 weeks (11/28/19)    Time 4    Period Weeks    Status Partially Met      PT SHORT TERM GOAL #3   Title Patient ind in [place] for right shoulder    Time 8    Period Weeks    Status On-going    Target Date 12/22/19      PT SHORT TERM GOAL #4   Title Pt to demo full AROM of right elbow    Time 8    Period Weeks    Status Achieved                    Plan - 12/01/19 0842    Clinical Impression Statement Patient fell in her kitchen last night and caught herself with her Rt UE. She has increased soreness and discomfort through the Rt shoulder and shoulder girdle today. Continued with gentle exercises; manual work; PROM; modalities. Tolerated all well.    Rehab Potential Excellent      PT Frequency 2x / week    PT Duration 8 weeks    PT Treatment/Interventions ADLs/Self Care Home Management;Cryotherapy;Electrical Stimulation;Iontophoresis 32m/ml Dexamethasone;Moist Heat;Therapeutic exercise;Therapeutic activities;Neuromuscular re-education;Patient/family education;Manual techniques;Taping;Dry needling;Passive range of motion;Vasopneumatic Device;Joint Manipulations;Spinal Manipulations    PT Next Visit Plan Progress Rt shoulder exercises per rehab protocol. (no cuff isometrics until 8 wks)    PT Home Exercise Plan [id number]    Consulted and Agree with Plan of Care Patient           Patient will benefit from skilled therapeutic intervention in order to improve the following deficits and impairments:     Visit Diagnosis: Stiffness of right shoulder, not elsewhere classified  Acute pain of right shoulder  Localized edema  Muscle weakness (generalized)     Problem List Patient Active Problem List   Diagnosis Date Noted  . Impingement syndrome of right shoulder 03/28/2019  . Primary osteoarthritis of both first carpometacarpal joints 07/29/2018  . Tendonitis of both wrists 06/01/2018  . Numbness and tingling in both hands 06/01/2018  . Eustachian tube dysfunction, bilateral 08/17/2017  . Angio-edema 08/10/2017  . Environmental allergies 07/21/2017  . Seasonal allergies 07/21/2017  . Seasonal allergic rhinitis 07/09/2017  . Post-nasal drip 07/09/2017  . 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 Trapezius muscle spasm 08/21/2016  . Vitamin D deficiency 08/01/2014  . B12 deficiency 08/01/2014  . Prolonged Q-T interval on ECG 10/06/2013  . Major depressive disorder, recurrent episode, moderate ([place] 12/27/2012  . Generalized anxiety disorder 12/27/2012  . Anxiety 04/27/2012  . Essential hypertension 04/27/2012  . Major depressive disorder 03/31/2012  . Chest pain 03/31/2012  . Shortness of breath 03/31/2012  . Migraine 03/31/2012  . PALPITATIONS 08/02/2009     Brynnan Rodenbaugh PNilda SimmerPT, MPH  12/01/2019, 9:25 AM  [facility] [address]Phone: [phone]  Fax:  [phone] Name: SLeticia ColettaMRN: [id number] of Birth: 501/10/1965

## 2019-12-05 ENCOUNTER — Other Ambulatory Visit: Payer: Self-pay

## 2019-12-05 ENCOUNTER — Ambulatory Visit: Payer: BC Managed Care – PPO | Admitting: Physical Therapy

## 2019-12-05 DIAGNOSIS — M25511 Pain in right shoulder: Secondary | ICD-10-CM | POA: Diagnosis not present

## 2019-12-05 DIAGNOSIS — M6281 Muscle weakness (generalized): Secondary | ICD-10-CM

## 2019-12-05 DIAGNOSIS — M25611 Stiffness of right shoulder, not elsewhere classified: Secondary | ICD-10-CM

## 2019-12-05 DIAGNOSIS — R600 Localized edema: Secondary | ICD-10-CM | POA: Diagnosis not present

## 2019-12-05 NOTE — Therapy (Signed)
[address] [place], [place], [address] Phone: [phone]   Fax:  [phone]  Physical Therapy Treatment  Patient Details  Name: Darlene Ochoa MRN: [id number] Date of Birth: 08/28/1965 Referring Provider (PT): Ophelia Charter, MD   Encounter Date: 12/05/2019   PT End of Session - 12/05/19 0848    Visit Number 11    Number of Visits 16    Date for PT Re-Evaluation 12/22/19    Authorization Type BCBS    PT Start Time 0160    PT Stop Time 0937    PT Time Calculation (min) 50 min    Activity Tolerance Patient tolerated treatment well    Behavior During Therapy [facility] for tasks assessed/performed           Past Medical History:  Diagnosis Date  . Anxiety   . Chest pain 03/31/2012  . Depression   . H/O prolonged Q-T interval on ECG 03/31/2012  . Hypertension   . Major depressive disorder 03/31/2012  . Seasonal allergies     Past Surgical History:  Procedure Laterality Date  . CESAREAN SECTION  1998  . ROTATOR CUFF REPAIR Right 2021    There were no vitals filed for this visit.   Subjective Assessment - 12/05/19 0851    Subjective "I am tired of being pain".   She is still sore from falling after tripping on her dogs tail (caught herself with her RUE).    Patient Stated Goals New grandbaby coming in Oct so to hurry up and be well    Currently in Pain? Yes    Pain Score 4     Pain Location Shoulder    Pain Orientation Right    Pain Descriptors / Indicators Nagging;Dull;Aching;Tightness    Pain Relieving Factors ice              [facility] PT Assessment - 12/05/19 0001      Assessment   Medical Diagnosis Rt RCR/DCE/SAD    Referring Provider (PT) Ophelia Charter, MD    Onset Date/Surgical Date 10/17/19    Hand Dominance Right    Next MD Visit 12/2019      PROM   Right/Left Shoulder Right    Right Shoulder Flexion 132 Degrees    Right Shoulder External Rotation 25 Degrees            [facility] Adult PT  Treatment/Exercise - 12/05/19 0001      Elbow Exercises   Elbow Flexion AROM;Right;10 reps      Shoulder Exercises: Standing   Extension AAROM;Both;10 reps   with cane   Other Standing Exercises bilat scap retraction with back against pool noodle x 5 sec x 10 reps       Electrical Stimulation   Electrical Stimulation Location Rt shoulder girdle / Rt medial and lateral distal humerus    Electrical Stimulation Action premod to each area     Electrical Stimulation Parameters intensity to tolerance     Electrical Stimulation Goals Pain;Tone      Vasopneumatic   Number Minutes Vasopneumatic  10 minutes    Vasopnuematic Location  Shoulder    Vasopneumatic Pressure Low    Vasopneumatic Temperature  34 deg      Manual Therapy   Soft tissue mobilization IASTM to Rt wrist ext and flexors, Rt brachioradialis, biceps brachii.  STM to Rt pec, levator, rhomboid.     Myofascial Release Rt pec     Scapular Mobilization in supine all planes  Passive ROM Rt shoulder into ext, scaption, flexion, ER - all within tissue limits and no pain.                     PT Short Term Goals - 11/28/19 0957      PT SHORT TERM GOAL #1   Title Ind with initial HEP    Time 2    Period Weeks    Status Achieved    Target Date 11/10/19      PT SHORT TERM GOAL #2   Title PROM to protocol goals by 6 weeks (11/28/19)    Time 4    Period Weeks    Status Partially Met      PT SHORT TERM GOAL #3   Title Patient ind in [place] for right shoulder    Time 8    Period Weeks    Status On-going    Target Date 12/22/19      PT SHORT TERM GOAL #4   Title Pt to demo full AROM of right elbow    Time 8    Period Weeks    Status Achieved                    Plan - 12/05/19 0931    Clinical Impression Statement Gradual improvement in Rt shoulder PROM.   Palpable tightness in Rt pec maj/minor, levator, brachioradialis; improved with MFR/STM.  Encouraged self care of self massage, TENS, as well  as scap retraction and shoulder rolls to assist with reduction of pain and tightness. PRogressing gradually towards goals.    Rehab Potential Excellent    PT Frequency 2x / week    PT Duration 8 weeks    PT Treatment/Interventions ADLs/Self Care Home Management;Cryotherapy;Electrical Stimulation;Iontophoresis 40m/ml Dexamethasone;Moist Heat;Therapeutic exercise;Therapeutic activities;Neuromuscular re-education;Patient/family education;Manual techniques;Taping;Dry needling;Passive range of motion;Vasopneumatic Device;Joint Manipulations;Spinal Manipulations    PT Next Visit Plan Progress Rt shoulder exercises per rehab protocol. (no cuff isometrics until 8 wks)    PT Home Exercise Plan [id number]    Consulted and Agree with Plan of Care Patient           Patient will benefit from skilled therapeutic intervention in order to improve the following deficits and impairments:  Decreased range of motion, Increased edema, Impaired UE functional use, Impaired flexibility, Decreased strength, Pain, Postural dysfunction  Visit Diagnosis: Stiffness of right shoulder, not elsewhere classified  Acute pain of right shoulder  Localized edema  Muscle weakness (generalized)     Problem List Patient Active Problem List   Diagnosis Date Noted  . Impingement syndrome of right shoulder 03/28/2019  . Primary osteoarthritis of both first carpometacarpal joints 07/29/2018  . Tendonitis of both wrists 06/01/2018  . Numbness and tingling in both hands 06/01/2018  . Eustachian tube dysfunction, bilateral 08/17/2017  . Angio-edema 08/10/2017  . Environmental allergies 07/21/2017  . Seasonal allergies 07/21/2017  . Seasonal allergic rhinitis 07/09/2017  . Post-nasal drip 07/09/2017  . 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 Trapezius muscle spasm 08/21/2016  . Vitamin D deficiency 08/01/2014  . B12 deficiency 08/01/2014  . Prolonged Q-T interval on ECG 10/06/2013  . Major depressive disorder, recurrent episode,  moderate ([place] 12/27/2012  . Generalized anxiety disorder 12/27/2012  . Anxiety 04/27/2012  . Essential hypertension 04/27/2012  . Major depressive disorder 03/31/2012  . Chest pain 03/31/2012  . Shortness of breath 03/31/2012  . Migraine 03/31/2012  . PALPITATIONS 08/02/2009   JKerin Perna PTA 12/05/19 3:54 PM   [facility] [address] [place], [place], [address] Phone: [phone]   Fax:  [phone]  Name: Darlene Ochoa MRN: [id number] Date of Birth: September 22, 1965

## 2019-12-08 ENCOUNTER — Encounter: Payer: Self-pay | Admitting: Physical Therapy

## 2019-12-08 ENCOUNTER — Ambulatory Visit (INDEPENDENT_AMBULATORY_CARE_PROVIDER_SITE_OTHER): Payer: BC Managed Care – PPO | Admitting: Physical Therapy

## 2019-12-08 ENCOUNTER — Other Ambulatory Visit: Payer: Self-pay

## 2019-12-08 DIAGNOSIS — M25611 Stiffness of right shoulder, not elsewhere classified: Secondary | ICD-10-CM | POA: Diagnosis not present

## 2019-12-08 DIAGNOSIS — M25511 Pain in right shoulder: Secondary | ICD-10-CM | POA: Diagnosis not present

## 2019-12-08 DIAGNOSIS — R600 Localized edema: Secondary | ICD-10-CM

## 2019-12-08 NOTE — Therapy (Signed)
[address], [place], [address] Phone: [phone]   Fax:  [phone]  Physical Therapy Treatment  Patient Details  Name: Darlene Ochoa MRN: [id number] Date of Birth: Sep 15, 1965 Referring Provider (PT): Ophelia Charter, MD   Encounter Date: 12/08/2019   PT End of Session - 12/08/19 0852    Visit Number 12    Number of Visits 16    Date for PT Re-Evaluation 12/22/19    Authorization Type BCBS    PT Start Time 0847    PT Stop Time 0931    PT Time Calculation (min) 44 min    Activity Tolerance Patient tolerated treatment well    Behavior During Therapy [facility] for tasks assessed/performed           Past Medical History:  Diagnosis Date  . Anxiety   . Chest pain 03/31/2012  . Depression   . H/O prolonged Q-T interval on ECG 03/31/2012  . Hypertension   . Major depressive disorder 03/31/2012  . Seasonal allergies     Past Surgical History:  Procedure Laterality Date  . CESAREAN SECTION  1998  . ROTATOR CUFF REPAIR Right 2021    There were no vitals filed for this visit.   Subjective Assessment - 12/08/19 0852    Subjective Pt reports she overdid it yesterday preparing her house for appraisal.  She reported a pain level of 8.5/10 yesterday.  Took pain medicine; reduced pain but didn't eliminate it.  She didn't sleep well.    Currently in Pain? Yes    Pain Score 7     Pain Location Shoulder    Pain Descriptors / Indicators Aching;Sore              [facility] PT Assessment - 12/08/19 0001      Assessment   Medical Diagnosis Rt RCR/DCE/SAD    Referring Provider (PT) Ophelia Charter, MD    Onset Date/Surgical Date 10/17/19    Hand Dominance Right    Next MD Visit 12/2019            [facility] Adult PT Treatment/Exercise - 12/08/19 0001      Shoulder Exercises: Supine   External Rotation AAROM;Right;10 reps   cane in supine   Flexion AAROM;Both;10 reps   cane   Flexion Limitations also, chest press  towards ceiling x 10      Shoulder Exercises: Pulleys   Flexion 3 minutes    Scaption 3 minutes      Electrical Stimulation   Electrical Stimulation Location Rt shoulder    Electrical Stimulation Action IFC     Electrical Stimulation Parameters 10 min, intensity to tolerance     Electrical Stimulation Goals Pain;Tone      Vasopneumatic   Number Minutes Vasopneumatic  10 minutes    Vasopnuematic Location  Shoulder   Rt   Vasopneumatic Pressure Low    Vasopneumatic Temperature  34 deg      Manual Therapy   Soft tissue mobilization Rt periscapular musculature    Myofascial Release MFR to Rt pec, levator, scalenes    Scapular Mobilization in supine all planes    Passive ROM Rt shoulder into ext, scaption, flexion, ER - all within tissue limits and no pain.           scap retraction x 5 sec x 5 reps;  Shoulder rolls x 10 rep backwards.      PT Short Term Goals - 11/28/19 0957      PT  SHORT TERM GOAL #1   Title Ind with initial HEP    Time 2    Period Weeks    Status Achieved    Target Date 11/10/19      PT SHORT TERM GOAL #2   Title PROM to protocol goals by 6 weeks (11/28/19)    Time 4    Period Weeks    Status Partially Met      PT SHORT TERM GOAL #3   Title Patient ind in [place] for right shoulder    Time 8    Period Weeks    Status On-going    Target Date 12/22/19      PT SHORT TERM GOAL #4   Title Pt to demo full AROM of right elbow    Time 8    Period Weeks    Status Achieved                    Plan - 12/08/19 8295    Clinical Impression Statement Pt arrived with elevated pain level in Rt shoulder, likely due to increased use of RUE.  Pt reported reduction of pain with PROM, STM and further reduction with estim/vaso at end of session.  PROM remained similar to last session. Progressing gradually towards goals.    Rehab Potential Excellent    PT Frequency 2x / week    PT Duration 8 weeks    PT Treatment/Interventions ADLs/Self Care Home  Management;Cryotherapy;Electrical Stimulation;Iontophoresis 80m/ml Dexamethasone;Moist Heat;Therapeutic exercise;Therapeutic activities;Neuromuscular re-education;Patient/family education;Manual techniques;Taping;Dry needling;Passive range of motion;Vasopneumatic Device;Joint Manipulations;Spinal Manipulations    PT Next Visit Plan Progress Rt shoulder exercises per rehab protocol. (no cuff isometrics until 8 wks-12/12/19)    PT Home Exercise Plan [email or web address]    Consulted and Agree with Plan of Care Patient           Patient will benefit from skilled therapeutic intervention in order to improve the following deficits and impairments:  Decreased range of motion, Increased edema, Impaired UE functional use, Impaired flexibility, Decreased strength, Pain, Postural dysfunction  Visit Diagnosis: Stiffness of right shoulder, not elsewhere classified  Acute pain of right shoulder  Localized edema     Problem List Patient Active Problem List   Diagnosis Date Noted  . Impingement syndrome of right shoulder 03/28/2019  . Primary osteoarthritis of both first carpometacarpal joints 07/29/2018  . Tendonitis of both wrists 06/01/2018  . Numbness and tingling in both hands 06/01/2018  . Eustachian tube dysfunction, bilateral 08/17/2017  . Angio-edema 08/10/2017  . Environmental allergies 07/21/2017  . Seasonal allergies 07/21/2017  . Seasonal allergic rhinitis 07/09/2017  . Post-nasal drip 07/09/2017  . 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 Trapezius muscle spasm 08/21/2016  . Vitamin D deficiency 08/01/2014  . B12 deficiency 08/01/2014  . Prolonged Q-T interval on ECG 10/06/2013  . Major depressive disorder, recurrent episode, moderate ([place] 12/27/2012  . Generalized anxiety disorder 12/27/2012  . Anxiety 04/27/2012  . Essential hypertension 04/27/2012  . Major depressive disorder 03/31/2012  . Chest pain 03/31/2012  . Shortness of breath 03/31/2012  . Migraine 03/31/2012  . PALPITATIONS  08/02/2009   JKerin Perna PTA 12/08/19 9:41 AM  [facility] [address]Phone: [phone]  Fax:  [phone] Name: Darlene MaximMRN: [id number] of Birth: [birth year]

## 2019-12-12 ENCOUNTER — Other Ambulatory Visit: Payer: Self-pay

## 2019-12-12 ENCOUNTER — Ambulatory Visit: Payer: BC Managed Care – PPO | Admitting: Physical Therapy

## 2019-12-12 DIAGNOSIS — R600 Localized edema: Secondary | ICD-10-CM

## 2019-12-12 DIAGNOSIS — M6281 Muscle weakness (generalized): Secondary | ICD-10-CM

## 2019-12-12 DIAGNOSIS — M25611 Stiffness of right shoulder, not elsewhere classified: Secondary | ICD-10-CM | POA: Diagnosis not present

## 2019-12-12 DIAGNOSIS — M25511 Pain in right shoulder: Secondary | ICD-10-CM | POA: Diagnosis not present

## 2019-12-12 NOTE — Therapy (Signed)
[place], [place], [address] Phone: [phone]   Fax:  [phone]  Physical Therapy Treatment  Patient Details  Name: Darlene Ochoa MRN: [id number] Date of Birth: 04-27-1965 Referring Provider (PT): Ophelia Charter, MD   Encounter Date: 12/12/2019   PT End of Session - 12/12/19 1018    Visit Number 13    Number of Visits 16    Date for PT Re-Evaluation 12/22/19    Authorization Type BCBS    PT Start Time 1013    PT Stop Time 1106    PT Time Calculation (min) 53 min    Activity Tolerance Patient tolerated treatment well    Behavior During Therapy [facility] for tasks assessed/performed           Past Medical History:  Diagnosis Date  . Anxiety   . Chest pain 03/31/2012  . Depression   . H/O prolonged Q-T interval on ECG 03/31/2012  . Hypertension   . Major depressive disorder 03/31/2012  . Seasonal allergies     Past Surgical History:  Procedure Laterality Date  . CESAREAN SECTION  1998  . ROTATOR CUFF REPAIR Right 2021    There were no vitals filed for this visit.   Subjective Assessment - 12/12/19 1018    Subjective Pt reports difficulty moving her RUE into ER with stick.  Her thumbs have been irritated from [facility] exercises. She'd like to try DN for thumbs in future      Patient Stated Goals New grandbaby coming in Oct so to hurry up and be well    Currently in Pain? Yes    Pain Score 3     Pain Location Shoulder    Pain Orientation Right;Anterior;Posterior    Pain Descriptors / Indicators Aching    Aggravating Factors  using RUE.    Pain Relieving Factors ice, estim              [facility] PT Assessment - 12/12/19 0001      Assessment   Medical Diagnosis Rt RCR/DCE/SAD    Referring Provider (PT) Ophelia Charter, MD    Onset Date/Surgical Date 10/17/19    Hand Dominance Right    Next MD Visit 12/2019      AROM   AROM Assessment Site Shoulder    Right/Left Shoulder Right    Right  Shoulder Extension 25 Degrees            [facility] Adult PT Treatment/Exercise - 12/12/19 0001      Shoulder Exercises: Standing   Internal Rotation AAROM;Both;10 reps   2 sets with cane   Internal Rotation Limitations one set swinging behind back, other set bending elbows moving cane over buttocks.     Extension AAROM;Both;10 reps    Other Standing Exercises wall ladder, flexion RUE, up to #23 x 4 reps       Shoulder Exercises: Pulleys   Flexion 2 minutes    Scaption 2 minutes      Shoulder Exercises: Isometric Strengthening   Flexion 5X5"    Extension 5X5"    External Rotation 5X5"    Internal Rotation 5X5"      Shoulder Exercises: Stretch   Table Stretch - Flexion 6 reps;10 seconds      Modalities   Modalities Cryotherapy;Electrical Stimulation      Cryotherapy   Number Minutes Cryotherapy 10 Minutes    Cryotherapy Location Shoulder    Type of Cryotherapy Ice pack      Electrical  Stimulation   Electrical Stimulation Location Rt shoulder    Electrical Stimulation Action IFC    Electrical Stimulation Parameters 10 min, intensity to tolerance    Electrical Stimulation Goals Pain      Manual Therapy   Soft tissue mobilization Rt periscapular musculature to decrease fascial restrictions and improved ROM.     Myofascial Release MFR to Rt pec     Passive ROM Rt shoulder ER, scaption, flexion                    PT Short Term Goals - 11/28/19 0957      PT SHORT TERM GOAL #1   Title Ind with initial HEP    Time 2    Period Weeks    Status Achieved    Target Date 11/10/19      PT SHORT TERM GOAL #2   Title PROM to protocol goals by 6 weeks (11/28/19)    Time 4    Period Weeks    Status Partially Met      PT SHORT TERM GOAL #3   Title Patient ind in [place] for right shoulder    Time 8    Period Weeks    Status On-going    Target Date 12/22/19      PT SHORT TERM GOAL #4   Title Pt to demo full AROM of right elbow    Time 8    Period Weeks     Status Achieved                    Plan - 12/12/19 1324    Clinical Impression Statement Pt's Rt shoulder ROM gradually improving; with slow repetitions, pt able to relax further into stretches.  Added isometrics per 8 wks in protocol.  Pt Progressing well within rehab protocol.    Rehab Potential Excellent    PT Frequency 2x / week    PT Duration 8 weeks    PT Treatment/Interventions ADLs/Self Care Home Management;Cryotherapy;Electrical Stimulation;Iontophoresis 66m/ml Dexamethasone;Moist Heat;Therapeutic exercise;Therapeutic activities;Neuromuscular re-education;Patient/family education;Manual techniques;Taping;Dry needling;Passive range of motion;Vasopneumatic Device;Joint Manipulations;Spinal Manipulations    PT Next Visit Plan Progress Rt shoulder exercises per rehab protocol    PT Home Exercise Plan [id number]    Consulted and Agree with Plan of Care Patient           Patient will benefit from skilled therapeutic intervention in order to improve the following deficits and impairments:  Decreased range of motion, Increased edema, Impaired UE functional use, Impaired flexibility, Decreased strength, Pain, Postural dysfunction  Visit Diagnosis: Stiffness of right shoulder, not elsewhere classified  Acute pain of right shoulder  Localized edema  Muscle weakness (generalized)     Problem List Patient Active Problem List   Diagnosis Date Noted  . Impingement syndrome of right shoulder 03/28/2019  . Primary osteoarthritis of both first carpometacarpal joints 07/29/2018  . Tendonitis of both wrists 06/01/2018  . Numbness and tingling in both hands 06/01/2018  . Eustachian tube dysfunction, bilateral 08/17/2017  . Angio-edema 08/10/2017  . Environmental allergies 07/21/2017  . Seasonal allergies 07/21/2017  . Seasonal allergic rhinitis 07/09/2017  . Post-nasal drip 07/09/2017  . 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 Trapezius muscle spasm 08/21/2016  . Vitamin D deficiency  08/01/2014  . B12 deficiency 08/01/2014  . Prolonged Q-T interval on ECG 10/06/2013  . Major depressive disorder, recurrent episode, moderate ([place] 12/27/2012  . Generalized anxiety disorder 12/27/2012  . Anxiety 04/27/2012  . Essential hypertension 04/27/2012  .  Major depressive disorder 03/31/2012  . Chest pain 03/31/2012  . Shortness of breath 03/31/2012  . Migraine 03/31/2012  . PALPITATIONS 08/02/2009   Kerin Perna, PTA 12/12/19 1:26 PM  [place], [place], [address] Phone: [phone]   Fax:  [phone]  Name: Darlene Ochoa MRN: [id number] Date of Birth: 06/06/1965

## 2019-12-15 ENCOUNTER — Ambulatory Visit: Payer: BC Managed Care – PPO | Admitting: Physical Therapy

## 2019-12-15 ENCOUNTER — Encounter: Payer: Self-pay | Admitting: Physical Therapy

## 2019-12-15 ENCOUNTER — Other Ambulatory Visit: Payer: Self-pay

## 2019-12-15 DIAGNOSIS — M25511 Pain in right shoulder: Secondary | ICD-10-CM | POA: Diagnosis not present

## 2019-12-15 DIAGNOSIS — M25611 Stiffness of right shoulder, not elsewhere classified: Secondary | ICD-10-CM | POA: Diagnosis not present

## 2019-12-15 DIAGNOSIS — M6281 Muscle weakness (generalized): Secondary | ICD-10-CM

## 2019-12-15 NOTE — Therapy (Signed)
[place], [place], [address] Phone: [phone]   Fax:  [phone]  Physical Therapy Treatment  Patient Details  Name: Darlene Ochoa MRN: [id number] Date of Birth: [birth year] Referring Provider (PT): Ophelia Charter, MD   Encounter Date: 12/15/2019   PT End of Session - 12/15/19 1153    Visit Number 14    Number of Visits 16    Date for PT Re-Evaluation 12/22/19    Authorization Type BCBS    PT Start Time 1146    PT Stop Time 1231    PT Time Calculation (min) 45 min    Activity Tolerance Patient tolerated treatment well    Behavior During Therapy [facility] for tasks assessed/performed           Past Medical History:  Diagnosis Date  . Anxiety   . Chest pain 03/31/2012  . Depression   . H/O prolonged Q-T interval on ECG 03/31/2012  . Hypertension   . Major depressive disorder 03/31/2012  . Seasonal allergies     Past Surgical History:  Procedure Laterality Date  . CESAREAN SECTION  1998  . ROTATOR CUFF REPAIR Right 2021    There were no vitals filed for this visit.   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  She has been using ice and her TENS unit and it has helped control soreness.    Patient Stated Goals New grandbaby coming in Oct so to hurry up and be well    Currently in Pain? Yes    Pain Score 3     Pain Location Arm    Pain Orientation Right;Distal    Pain Descriptors / Indicators Sore              [facility] PT Assessment - 12/15/19 0001      Assessment   Medical Diagnosis Rt RCR/DCE/SAD    Referring Provider (PT) Ophelia Charter, MD    Onset Date/Surgical Date 10/17/19    Hand Dominance Right    Next MD Visit 12/2019      PROM   Right Shoulder Flexion 132 Degrees    Right Shoulder External Rotation 60 Degrees           [facility] Adult PT Treatment/Exercise - 12/15/19 0001      Shoulder Exercises: Supine    Flexion AAROM;Both;5 reps    Other Supine Exercises RUE abdct 80-45 deg off of table to nerve glides x 10 (limited tolerance due to increased pain in forearm)      Shoulder Exercises: Seated   Other Seated Exercises scapular depression with RUE pushing down into green pball x 5 sec x 10 reps      Shoulder Exercises: Standing   Other Standing Exercises wall ladder, flexion RUE, up to #23 x 3 reps, scaption x 3 reps; L's and W's in front of mirror x 5 reps of 3 sec each     Shoulder Exercises: Pulleys   Flexion 2 minutes    Scaption 2 minutes      Shoulder Exercises: Therapy Ball   Flexion Both   3 reps, rolling ball up wall      Shoulder Exercises: Stretch   Corner Stretch 1 rep;20 seconds    Table Stretch - Flexion 5 reps;10 seconds    Table Stretch - Abduction 5 reps;10 seconds    Table Stretch - External Rotation 5 reps;10 seconds  Modalities   Modalities --   declined; will use TENS at home.      Manual Therapy   Soft tissue mobilization Rt periscapular musculature to decrease fascial restrictions and improved ROM.     Scapular Mobilization in Lt sidelying in all planes     Passive ROM Rt shoulder ext, ER,horiz abdct, scaption, flexion to tissue limits and no pain                    PT Short Term Goals - 11/28/19 0957      PT SHORT TERM GOAL #1   Title Ind with initial HEP    Time 2    Period Weeks    Status Achieved    Target Date 11/10/19      PT SHORT TERM GOAL #2   Title PROM to protocol goals by 6 weeks (11/28/19)    Time 4    Period Weeks    Status Partially Met      PT SHORT TERM GOAL #3   Title Patient ind in [place] for right shoulder    Time 8    Period Weeks    Status On-going    Target Date 12/22/19      PT SHORT TERM GOAL #4   Title Pt to demo full AROM of right elbow    Time 8    Period Weeks    Status Achieved                    Plan - 12/15/19 1250    Clinical Impression Statement Good improvement in Rt shoulder  ER ROM.   Pt tolerated all new stretches well, with minimal to no pain. Progressing well within rehab protocol.    Rehab Potential Excellent    PT Frequency 2x / week    PT Duration 8 weeks    PT Treatment/Interventions ADLs/Self Care Home Management;Cryotherapy;Electrical Stimulation;Iontophoresis 24m/ml Dexamethasone;Moist Heat;Therapeutic exercise;Therapeutic activities;Neuromuscular re-education;Patient/family education;Manual techniques;Taping;Dry needling;Passive range of motion;Vasopneumatic Device;Joint Manipulations;Spinal Manipulations    PT Next Visit Plan Progress Rt shoulder exercises per rehab protocol.  trial more AROM in front of mirror.    PT Home Exercise Plan [id number]    Consulted and Agree with Plan of Care Patient           Patient will benefit from skilled therapeutic intervention in order to improve the following deficits and impairments:  Decreased range of motion, Increased edema, Impaired UE functional use, Impaired flexibility, Decreased strength, Pain, Postural dysfunction  Visit Diagnosis: Stiffness of right shoulder, not elsewhere classified  Acute pain of right shoulder  Muscle weakness (generalized)     Problem List Patient Active Problem List   Diagnosis Date Noted  . Impingement syndrome of right shoulder 03/28/2019  . Primary osteoarthritis of both first carpometacarpal joints 07/29/2018  . Tendonitis of both wrists 06/01/2018  . Numbness and tingling in both hands 06/01/2018  . Eustachian tube dysfunction, bilateral 08/17/2017  . Angio-edema 08/10/2017  . Environmental allergies 07/21/2017  . Seasonal allergies 07/21/2017  . Seasonal allergic rhinitis 07/09/2017  . Post-nasal drip 07/09/2017  . 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 Trapezius muscle spasm 08/21/2016  . Vitamin D deficiency 08/01/2014  . B12 deficiency 08/01/2014  . Prolonged Q-T interval on ECG 10/06/2013  . Major depressive disorder, recurrent episode, moderate ([place] 12/27/2012    . Generalized anxiety disorder 12/27/2012  . Anxiety 04/27/2012  . Essential hypertension 04/27/2012  . Major depressive disorder 03/31/2012  . Chest pain 03/31/2012  .  Shortness of breath 03/31/2012  . Migraine 03/31/2012  . PALPITATIONS 08/02/2009   Kerin Perna, PTA 12/15/19 1:12 PM  [place], [place], [address] Phone: [phone]   Fax:  [phone]  Name: Darlene Ochoa MRN: [id number] Date of Birth: 03-19-1966

## 2019-12-20 ENCOUNTER — Ambulatory Visit: Payer: BC Managed Care – PPO | Admitting: Physical Therapy

## 2019-12-20 ENCOUNTER — Encounter: Payer: Self-pay | Admitting: Physical Therapy

## 2019-12-20 ENCOUNTER — Other Ambulatory Visit: Payer: Self-pay

## 2019-12-20 DIAGNOSIS — M25611 Stiffness of right shoulder, not elsewhere classified: Secondary | ICD-10-CM

## 2019-12-20 DIAGNOSIS — M6281 Muscle weakness (generalized): Secondary | ICD-10-CM

## 2019-12-20 DIAGNOSIS — R600 Localized edema: Secondary | ICD-10-CM | POA: Diagnosis not present

## 2019-12-20 DIAGNOSIS — M25511 Pain in right shoulder: Secondary | ICD-10-CM | POA: Diagnosis not present

## 2019-12-20 NOTE — Therapy (Signed)
[place], [place], [address] Phone: [phone]   Fax:  [phone]  Physical Therapy Treatment  Patient Details  Name: Darlene Ochoa MRN: [id number] Date of Birth: [birth year] Referring Provider (PT): Ophelia Charter, MD   Encounter Date: 12/20/2019   PT End of Session - 12/20/19 0937    Visit Number 15    Number of Visits 16    Date for PT Re-Evaluation 12/22/19    Authorization Type BCBS    PT Start Time 0932    PT Stop Time 1015    PT Time Calculation (min) 43 min    Activity Tolerance Patient tolerated treatment well    Behavior During Therapy [facility] for tasks assessed/performed           Past Medical History:  Diagnosis Date  . Anxiety   . Chest pain 03/31/2012  . Depression   . H/O prolonged Q-T interval on ECG 03/31/2012  . Hypertension   . Major depressive disorder 03/31/2012  . Seasonal allergies     Past Surgical History:  Procedure Laterality Date  . CESAREAN SECTION  1998  . ROTATOR CUFF REPAIR Right 2021    There were no vitals filed for this visit.   Subjective Assessment - 12/20/19 0935    Subjective She states she stretched her RUE before bed. She woke to pain in Rt shoulder at 2 am, "maybe I stretched too much".    Patient Stated Goals New grandbaby coming in Oct so to hurry up and be well    Currently in Pain? Yes    Pain Score 2     Pain Location Shoulder    Pain Orientation Right    Pain Descriptors / Indicators Sore;Tightness    Pain Radiating Towards to Rt elbow    Aggravating Factors  overdoing it.    Pain Relieving Factors ice, estim              [facility] PT Assessment - 12/20/19 0001      Assessment   Medical Diagnosis Rt RCR/DCE/SAD    Referring Provider (PT) Ophelia Charter, MD    Onset Date/Surgical Date 10/17/19    Hand Dominance Right    Next MD Visit 12/2019      AROM   Right/Left Shoulder Right    Right Shoulder Extension 25 Degrees    Right  Shoulder Flexion 120 Degrees    Right Shoulder ABduction 120 Degrees            [facility] Adult PT Treatment/Exercise - 12/20/19 0001      Shoulder Exercises: Supine   Horizontal ABduction Strengthening;Right   2 reps - arm to table, 90 deg abdct   External Rotation AAROM;Right;10 reps   cane in supine   Flexion AAROM;Both;5 reps   10 sec holds with cane   Diagonals AAROM;Right;5 reps   limited tolerance     Shoulder Exercises: Sidelying   External Rotation AROM;Right;5 reps      Shoulder Exercises: Standing   Internal Rotation Both;AAROM;10 reps    Flexion AROM;Right;10 reps   to ~70 deg, in front of mirror, cues for scap depression   ABduction AROM;Right;10 reps   to ~70 deg, in front of mirror, cues for scap depression   ABduction Limitations scaption    Other Standing Exercises rings on arch (10) Rt to/from Lt with Rt hand with shoulder flexion to 90 deg, in front of mirror for visual feedback to avoid shoulder hiking.  Shoulder Exercises: Stretch   Star Gazer Stretch 3 reps;30 seconds    Other Shoulder Stretches Rt bicep stretch holding door frame x 30 sec x 2 reps       Modalities   Modalities --   declined; will use TENS at home.      Manual Therapy   Scapular Mobilization in Lt sidelying in all planes     Passive ROM Rt shoulder ext, ER,horiz abdct, scaption, flexion to tissue limits and no pain                    PT Short Term Goals - 11/28/19 0957      PT SHORT TERM GOAL #1   Title Ind with initial HEP    Time 2    Period Weeks    Status Achieved    Target Date 11/10/19      PT SHORT TERM GOAL #2   Title PROM to protocol goals by 6 weeks (11/28/19)    Time 4    Period Weeks    Status Partially Met      PT SHORT TERM GOAL #3   Title Patient ind in [place] for right shoulder    Time 8    Period Weeks    Status On-going    Target Date 12/22/19      PT SHORT TERM GOAL #4   Title Pt to demo full AROM of right elbow    Time 8    Period  Weeks    Status Achieved             Plan - 12/20/19 1019    Clinical Impression Statement Pt's Rt shoulder ROM slowly progressing.  Continued limitation with ER, ext, and end range flexion/scaption.  Trialed standing AROM of flexion and scaption below 90 deg working on good scapular positioning.    Rehab Potential Excellent    PT Frequency 2x / week    PT Duration 8 weeks    PT Treatment/Interventions ADLs/Self Care Home Management;Cryotherapy;Electrical Stimulation;Iontophoresis 43m/ml Dexamethasone;Moist Heat;Therapeutic exercise;Therapeutic activities;Neuromuscular re-education;Patient/family education;Manual techniques;Taping;Dry needling;Passive range of motion;Vasopneumatic Device;Joint Manipulations;Spinal Manipulations    PT Next Visit Plan end of POC.  FOTO, reassess goals. Needs LTGs   PT Home Exercise Plan [id number]    Consulted and Agree with Plan of Care Patient           Patient will benefit from skilled therapeutic intervention in order to improve the following deficits and impairments:  Decreased range of motion, Increased edema, Impaired UE functional use, Impaired flexibility, Decreased strength, Pain, Postural dysfunction  Visit Diagnosis: Stiffness of right shoulder, not elsewhere classified  Acute pain of right shoulder  Muscle weakness (generalized)  Localized edema     Problem List Patient Active Problem List   Diagnosis Date Noted  . Impingement syndrome of right shoulder 03/28/2019  . Primary osteoarthritis of both first carpometacarpal joints 07/29/2018  . Tendonitis of both wrists 06/01/2018  . Numbness and tingling in both hands 06/01/2018  . Eustachian tube dysfunction, bilateral 08/17/2017  . Angio-edema 08/10/2017  . Environmental allergies 07/21/2017  . Seasonal allergies 07/21/2017  . Seasonal allergic rhinitis 07/09/2017  . Post-nasal drip 07/09/2017  . 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 Trapezius muscle spasm 08/21/2016  . Vitamin D  deficiency 08/01/2014  . B12 deficiency 08/01/2014  . Prolonged Q-T interval on ECG 10/06/2013  . Major depressive disorder, recurrent episode, moderate ([place] 12/27/2012  . Generalized anxiety disorder 12/27/2012  . Anxiety 04/27/2012  . Essential  hypertension 04/27/2012  . Major depressive disorder 03/31/2012  . Chest pain 03/31/2012  . Shortness of breath 03/31/2012  . Migraine 03/31/2012  . PALPITATIONS 08/02/2009   Kerin Perna, PTA 12/20/19 1:28 PM  [place], [place], [address] Phone: [phone]   Fax:  [phone]  Name: Darlene Ochoa MRN: [id number] Date of Birth: 06/16/1965

## 2019-12-22 ENCOUNTER — Encounter: Payer: Self-pay | Admitting: Rehabilitative and Restorative Service Providers"

## 2019-12-22 ENCOUNTER — Ambulatory Visit: Payer: BC Managed Care – PPO | Admitting: Rehabilitative and Restorative Service Providers"

## 2019-12-22 ENCOUNTER — Other Ambulatory Visit: Payer: Self-pay

## 2019-12-22 DIAGNOSIS — M25611 Stiffness of right shoulder, not elsewhere classified: Secondary | ICD-10-CM | POA: Diagnosis not present

## 2019-12-22 DIAGNOSIS — M6281 Muscle weakness (generalized): Secondary | ICD-10-CM

## 2019-12-22 DIAGNOSIS — R600 Localized edema: Secondary | ICD-10-CM | POA: Diagnosis not present

## 2019-12-22 DIAGNOSIS — M25511 Pain in right shoulder: Secondary | ICD-10-CM

## 2019-12-22 NOTE — Therapy (Signed)
[facility] [place] [address] [place] [address] [place], [place], [address] Phone: [phone]   Fax:  [phone]  Physical Therapy Treatment  Patient Details  Name: Darlene Ochoa MRN: [id number] Date of Birth: [birth year] Referring Provider (PT): Ramond Marrow, MD   Encounter Date: 12/22/2019   PT End of Session - 12/22/19 0936    Visit Number 16    Number of Visits 28    Date for PT Re-Evaluation 02/02/20    Authorization Type BCBS    PT Start Time 0933    PT Stop Time 1019    PT Time Calculation (min) 46 min    Activity Tolerance Patient tolerated treatment well           Past Medical History:  Diagnosis Date  . Anxiety   . Chest pain 03/31/2012  . Depression   . H/O prolonged Q-T interval on ECG 03/31/2012  . Hypertension   . Major depressive disorder 03/31/2012  . Seasonal allergies     Past Surgical History:  Procedure Laterality Date  . CESAREAN SECTION  1998  . ROTATOR CUFF REPAIR Right 2021    There were no vitals filed for this visit.   Subjective Assessment - 12/22/19 0937    Subjective Patient reports that she shoulder is doing well - just one sore spot in the Rt shoulder. Rt thumb is hurting more. may be interested in the DN for thumb but not today. Still using ice and TENS unit at home.    Currently in Pain? Yes    Pain Score 2     Pain Location Shoulder    Pain Orientation Right    Pain Descriptors / Indicators Aching;Sore    Pain Type Acute pain;Surgical pain    Pain Radiating Towards less pain into elbow    Pain Onset More than a month ago    Pain Frequency Intermittent    Aggravating Factors  overdoing it    Pain Relieving Factors ice estim              [facility] PT Assessment - 12/22/19 0001      Assessment   Medical Diagnosis Rt RCR/DCE/SAD    Referring Provider (PT) Ramond Marrow, MD    Onset Date/Surgical Date 10/17/19    Hand Dominance Right    Next MD Visit 12/2019      Observation/Other Assessments    Focus on Therapeutic Outcomes (FOTO)  45% limitation       AROM   Right/Left Shoulder Right    Right Shoulder Extension 25 Degrees    Right Shoulder Flexion 120 Degrees    Right Shoulder ABduction 120 Degrees      PROM   Right Shoulder Flexion 132 Degrees    Right Elbow Flexion 155    Right Elbow Extension 0      Palpation   Palpation comment muscular tightness Rt upper quarter - pecs; upper trap; leveator; teres; biceps; triceps       Special Tests   Other special tests Rt scapular dyskinesis with elevation of Rt UE past ~ 80 degrees scaption or 70 deg flexion                          [facility] Adult PT Treatment/Exercise - 12/22/19 0001      Neuro Re-ed    Neuro Re-ed Details  working on posterior shoulder girdle control with active movement of Rt UE       Shoulder Exercises:  Supine   Other Supine Exercises scap squeeze with foam roll 10 sec x 10 reps repeated with scaption in controlled range then reaching forward with Rt hand in controled range x 10-15 each x 2 sets       Shoulder Exercises: Standing   Flexion AROM;Right;10 reps   foam roll along spine VC for scapular control   ABduction AROM;Right;10 reps   foam roll laong spine VC for scapular control    ABduction Limitations scaption      Shoulder Exercises: Pulleys   Flexion 2 minutes    Scaption 2 minutes      Manual Therapy   Manual therapy comments pt in supine    Joint Mobilization GH jt mobs Rt grade II/III to increase ROM/mobility    Soft tissue mobilization deep tissue work Rt upper quarter - clavicular area and lateral/anterior cervical musculature; pecs; upper trap; leveator; teres; triceps; biceps     Myofascial Release anterior chest to anterior shoulder/arm    Scapular Mobilization Rt     Passive ROM Rt shoulder ext, ER,horiz abdct, scaption, flexion to tissue limits and no pain                    PT Short Term Goals - 12/22/19 1014      PT SHORT TERM GOAL #1   Title Ind  with initial HEP    Time 2    Status Achieved      PT SHORT TERM GOAL #2   Title PROM to protocol goals by 6 weeks (11/28/19)    Time 4    Period Weeks    Status Achieved      PT SHORT TERM GOAL #3   Title Patient ind in AAROM HEP for right shoulder    Time 8    Status On-going      PT SHORT TERM GOAL #4   Title Pt to demo full AROM of right elbow    Time 8    Period Weeks    Status Achieved             PT Long Term Goals - 12/22/19 1111      PT LONG TERM GOAL #1   Title Improve scapular control with patient to demonstrate elevation of Rt shoulder flexion and scaption to 90-100 deg with minimal upward movement of scapula    Time 6    Period Weeks    Status New    Target Date 02/02/20      PT LONG TERM GOAL #2   Title Increase AROM Rt shoulder to WFL's throughout    Time 6    Period Weeks    Status New    Target Date 02/02/20      PT LONG TERM GOAL #3   Title 4/5 to 4+/5 strength Rt UE    Time 6    Period Weeks    Status New    Target Date 02/09/20      PT LONG TERM GOAL #4   Title Independent in HEP    Time 6    Period Weeks    Status New    Target Date 02/02/20      PT LONG TERM GOAL #5   Title Improve FOTO to </= 37% limitation    Time 6    Period Weeks    Status New    Target Date 02/02/20                 Plan -  12/22/19 0952    Clinical Impression Statement Scapular dyskinesis noted with elevation of Rt shoulder. 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 Added thoracic self release with coregeous ball pt supine. Repeated UE movement with improved scapular control and incresaed movement. Short term goals of rehab accomplished. Added long term goals. Patient will benefit from PT to address problems identified and reach maximum rehab potential for Rt shoulder function.    Rehab Potential Excellent    PT Frequency 2x / week    PT Duration Other (comment)   18 weeks   PT  Treatment/Interventions ADLs/Self Care Home Management;Cryotherapy;Electrical Stimulation;Iontophoresis 4mg /ml Dexamethasone;Moist Heat;Therapeutic exercise;Therapeutic activities;Neuromuscular re-education;Patient/family education;Manual techniques;Taping;Dry needling;Passive range of motion;Vasopneumatic Device;Joint Manipulations;Spinal Manipulations    PT Next Visit Plan continue with shoulder rehab addressing ROM; scapular control; strength and function    PT Home Exercise Plan [email or web address]    Consulted and Agree with Plan of Care Patient           Patient will benefit from skilled therapeutic intervention in order to improve the following deficits and impairments:     Visit Diagnosis: Stiffness of right shoulder, not elsewhere classified - Plan: PT plan of care cert/re-cert  Acute pain of right shoulder - Plan: PT plan of care cert/re-cert  Muscle weakness (generalized) - Plan: PT plan of care cert/re-cert  Localized edema - Plan: PT plan of care cert/re-cert     Problem List Patient Active Problem List   Diagnosis Date Noted  . Impingement syndrome of right shoulder 03/28/2019  . Primary osteoarthritis of both first carpometacarpal joints 07/29/2018  . Tendonitis of both wrists 06/01/2018  . Numbness and tingling in both hands 06/01/2018  . Eustachian tube dysfunction, bilateral 08/17/2017  . Angio-edema 08/10/2017  . Environmental allergies 07/21/2017  . Seasonal allergies 07/21/2017  . Seasonal allergic rhinitis 07/09/2017  . Post-nasal drip 07/09/2017  . 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 Trapezius muscle spasm 08/21/2016  . Vitamin D deficiency 08/01/2014  . B12 deficiency 08/01/2014  . Prolonged Q-T interval on ECG 10/06/2013  . Major depressive disorder, recurrent episode, moderate (HCC) 12/27/2012  . Generalized anxiety disorder 12/27/2012  . Anxiety 04/27/2012  . Essential hypertension 04/27/2012  . Major depressive disorder 03/31/2012  . Chest pain 03/31/2012  .  Shortness of breath 03/31/2012  . Migraine 03/31/2012  . PALPITATIONS 08/02/2009    Myna Freimark 08/04/2009 PT, MPH  12/22/2019, 11:20 AM  [facility] [address] [place] [address] [place], [place], [place] Phone: [phone]   Fax:  [phone]  Name: Emmaclaire Switala MRN: [name] Date of Birth: [birth year]

## 2019-12-27 ENCOUNTER — Other Ambulatory Visit: Payer: Self-pay

## 2019-12-27 ENCOUNTER — Ambulatory Visit (INDEPENDENT_AMBULATORY_CARE_PROVIDER_SITE_OTHER): Payer: BC Managed Care – PPO | Admitting: Physical Therapy

## 2019-12-27 ENCOUNTER — Encounter: Payer: Self-pay | Admitting: Physical Therapy

## 2019-12-27 DIAGNOSIS — M6281 Muscle weakness (generalized): Secondary | ICD-10-CM

## 2019-12-27 DIAGNOSIS — M25611 Stiffness of right shoulder, not elsewhere classified: Secondary | ICD-10-CM

## 2019-12-27 DIAGNOSIS — M25511 Pain in right shoulder: Secondary | ICD-10-CM

## 2019-12-27 NOTE — Therapy (Signed)
[facility] [place] [address] [place] [address] [place], [place], [address] Phone: [phone]   Fax:  [phone]  Physical Therapy Treatment  Patient Details  Name: Darlene Ochoa MRN: [id number] Date of Birth: 1965/08/30 Referring Provider (PT): Ramond Marrow, MD   Encounter Date: 12/27/2019   PT End of Session - 12/27/19 0948    Visit Number 17    Number of Visits 28    Date for PT Re-Evaluation 02/02/20    Authorization Type BCBS    PT Start Time 0933    PT Stop Time 1015    PT Time Calculation (min) 42 min    Activity Tolerance Patient tolerated treatment well    Behavior During Therapy [facility] for tasks assessed/performed           Past Medical History:  Diagnosis Date  . Anxiety   . Chest pain 03/31/2012  . Depression   . H/O prolonged Q-T interval on ECG 03/31/2012  . Hypertension   . Major depressive disorder 03/31/2012  . Seasonal allergies     Past Surgical History:  Procedure Laterality Date  . CESAREAN SECTION  1998  . ROTATOR CUFF REPAIR Right 2021    There were no vitals filed for this visit.   Subjective Assessment - 12/27/19 0937    Subjective Pt reports her Rt shoulder "feels stuck".  She voices frustration with the motion in her shoulder.    Pertinent History HTN, anxiety, depression    Patient Stated Goals New grandbaby coming in Oct so to hurry up and be well    Currently in Pain? Yes    Pain Score 2     Pain Location Shoulder    Pain Orientation Right;Lower;Posterior    Pain Descriptors / Indicators Aching    Aggravating Factors  overdoing it    Pain Relieving Factors ice, estim              [facility] PT Assessment - 12/27/19 0001      Assessment   Medical Diagnosis Rt RCR/DCE/SAD    Referring Provider (PT) Ramond Marrow, MD    Onset Date/Surgical Date 10/17/19    Hand Dominance Right    Next MD Visit 01/10/20      AROM   Right/Left Shoulder Right    Right Shoulder Extension 40 Degrees    Right Shoulder  Flexion 124 Degrees    Right Shoulder ABduction 120 Degrees    Right Shoulder External Rotation 50 Degrees   scaption of 40 deg      PROM   Right Shoulder External Rotation 75 Degrees   scapular plane           [facility] Adult PT Treatment/Exercise - 12/27/19 0001      Shoulder Exercises: Sidelying   Other Sidelying Exercises Rt open book x 5 reps, range to tolerance with AAROM to return to neutral.        Shoulder Exercises: Standing   Internal Rotation Both;AAROM;10 reps    Internal Rotation Limitations then LUE assisting RUE behind back for stretch    Flexion AROM;Right;5 reps    Other Standing Exercises W's x 5 sec hold, x 5 reps (tactile cues from PTA)      Shoulder Exercises: Pulleys   Flexion 2 minutes    Scaption 2 minutes      Shoulder Exercises: Stretch   Table Stretch - Flexion 3 reps;30 seconds    Star Gazer Stretch 2 reps;30 seconds    Other Shoulder Stretches Rt bicep stretch  holding railing x 30 sec x 3 reps       Modalities   Modalities --   declined; will use TENS at home.      Manual Therapy   Soft tissue mobilization STM to Rt pec, lat, teres major, subscap, upper and mid trap, levator, rhomboid     Myofascial Release anterior chest to anterior shoulder/arm, Lat     Scapular Mobilization Rt     Passive ROM Rt shoulder ext, ER,horiz abdct, scaption, flexion to tissue limits and no pain                    PT Short Term Goals - 12/22/19 1014      PT SHORT TERM GOAL #1   Title Ind with initial HEP    Time 2    Status Achieved      PT SHORT TERM GOAL #2   Title PROM to protocol goals by 6 weeks (11/28/19)    Time 4    Period Weeks    Status Achieved      PT SHORT TERM GOAL #3   Title Patient ind in AAROM HEP for right shoulder    Time 8    Status On-going      PT SHORT TERM GOAL #4   Title Pt to demo full AROM of right elbow    Time 8    Period Weeks    Status Achieved             PT Long Term Goals - 12/22/19 1111      PT LONG  TERM GOAL #1   Title Improve scapular control with patient to demonstrate elevation of Rt shoulder flexion and scaption to 90-100 deg with minimal upward movement of scapula    Time 6    Period Weeks    Status New    Target Date 02/02/20      PT LONG TERM GOAL #2   Title Increase AROM Rt shoulder to WFL's throughout    Time 6    Period Weeks    Status New    Target Date 02/02/20      PT LONG TERM GOAL #3   Title 4/5 to 4+/5 strength Rt UE    Time 6    Period Weeks    Status New    Target Date 02/09/20      PT LONG TERM GOAL #4   Title Independent in HEP    Time 6    Period Weeks    Status New    Target Date 02/02/20      PT LONG TERM GOAL #5   Title Improve FOTO to </= 37% limitation    Time 6    Period Weeks    Status New    Target Date 02/02/20                 Plan - 12/27/19 1126    Clinical Impression Statement Session focused on PROM/AAROM/AROM.  Rt shoulder ext AROM improved, as well as shoulderER PROM.  Palpable tightness in Rt lat, teres major, subscap, and pec maj/minor.  Scapular dyskinesis persists.  LTG are ongoing.    Comorbidities anxiety, depression, bil thumb pain    Rehab Potential Excellent    PT Frequency 2x / week    PT Duration Other (comment)   18 weeks   PT Treatment/Interventions ADLs/Self Care Home Management;Cryotherapy;Electrical Stimulation;Iontophoresis 4mg /ml Dexamethasone;Moist Heat;Therapeutic exercise;Therapeutic activities;Neuromuscular re-education;Patient/family education;Manual techniques;Taping;Dry needling;Passive range of motion;Vasopneumatic Device;Joint Manipulations;Spinal  Manipulations    PT Next Visit Plan continue with shoulder rehab addressing ROM; scapular control; strength and function    PT Home Exercise Plan [id number]    Consulted and Agree with Plan of Care Patient           Patient will benefit from skilled therapeutic intervention in order to improve the following deficits and impairments:  Decreased range  of motion, Increased edema, Impaired UE functional use, Impaired flexibility, Decreased strength, Pain, Postural dysfunction  Visit Diagnosis: Stiffness of right shoulder, not elsewhere classified  Acute pain of right shoulder  Muscle weakness (generalized)     Problem List Patient Active Problem List   Diagnosis Date Noted  . Impingement syndrome of right shoulder 03/28/2019  . Primary osteoarthritis of both first carpometacarpal joints 07/29/2018  . Tendonitis of both wrists 06/01/2018  . Numbness and tingling in both hands 06/01/2018  . Eustachian tube dysfunction, bilateral 08/17/2017  . Angio-edema 08/10/2017  . Environmental allergies 07/21/2017  . Seasonal allergies 07/21/2017  . Seasonal allergic rhinitis 07/09/2017  . Post-nasal drip 07/09/2017  . 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 Trapezius muscle spasm 08/21/2016  . Vitamin D deficiency 08/01/2014  . B12 deficiency 08/01/2014  . Prolonged Q-T interval on ECG 10/06/2013  . Major depressive disorder, recurrent episode, moderate (HCC) 12/27/2012  . Generalized anxiety disorder 12/27/2012  . Anxiety 04/27/2012  . Essential hypertension 04/27/2012  . Major depressive disorder 03/31/2012  . Chest pain 03/31/2012  . Shortness of breath 03/31/2012  . Migraine 03/31/2012  . PALPITATIONS 08/02/2009   Mayer Camel, PTA 12/27/19 11:40 AM  [facility] [address] [place] [address] [place], [place], [address] Phone: [phone]   Fax:  [phone]  Name: Darlene Ochoa MRN: [id number] Date of Birth: 20-May-1965

## 2019-12-29 ENCOUNTER — Other Ambulatory Visit: Payer: Self-pay

## 2019-12-29 ENCOUNTER — Ambulatory Visit: Payer: BC Managed Care – PPO | Admitting: Physical Therapy

## 2019-12-29 DIAGNOSIS — M6281 Muscle weakness (generalized): Secondary | ICD-10-CM | POA: Diagnosis not present

## 2019-12-29 DIAGNOSIS — M25511 Pain in right shoulder: Secondary | ICD-10-CM | POA: Diagnosis not present

## 2019-12-29 DIAGNOSIS — M25611 Stiffness of right shoulder, not elsewhere classified: Secondary | ICD-10-CM | POA: Diagnosis not present

## 2019-12-29 NOTE — Therapy (Signed)
[facility] [place] [address] [place] [address] [place], [place], [address] Phone: [phone]   Fax:  [phone]  Physical Therapy Treatment  Patient Details  Name: Darlene Ochoa MRN: [id number] Date of Birth: Sep 06, 1965 Referring Provider (PT): Ramond Marrow, MD   Encounter Date: 12/29/2019   PT End of Session - 12/29/19 0936    Visit Number 18    Number of Visits 28    Date for PT Re-Evaluation 02/02/20    Authorization Type BCBS    PT Start Time 0932    PT Stop Time 1016    PT Time Calculation (min) 44 min    Activity Tolerance Patient tolerated treatment well    Behavior During Therapy [facility] for tasks assessed/performed           Past Medical History:  Diagnosis Date  . Anxiety   . Chest pain 03/31/2012  . Depression   . H/O prolonged Q-T interval on ECG 03/31/2012  . Hypertension   . Major depressive disorder 03/31/2012  . Seasonal allergies     Past Surgical History:  Procedure Laterality Date  . CESAREAN SECTION  1998  . ROTATOR CUFF REPAIR Right 2021    There were no vitals filed for this visit.   Subjective Assessment - 12/29/19 0936    Subjective Darlene Ochoa reports she grabbed her door quickly to shut the dogs in, and her shoulder had some pain afterwards. Otherwise, no new changes.    Patient Stated Goals New grandbaby coming in Oct so to hurry up and be well    Currently in Pain? Yes    Pain Score 3     Pain Location Shoulder    Pain Orientation Right;Lateral    Pain Descriptors / Indicators Aching              [facility] PT Assessment - 12/29/19 0001      Assessment   Medical Diagnosis Rt RCR/DCE/SAD    Referring Provider (PT) Ramond Marrow, MD    Onset Date/Surgical Date 10/17/19    Hand Dominance Right    Next MD Visit 01/10/20            [facility] Adult PT Treatment/Exercise - 12/29/19 0001      Shoulder Exercises: Supine   Protraction AROM;Right;5 reps    ABduction AAROM;Right;5 reps   with cane, varied angles-  slow speed   Other Supine Exercises RUE - AAROM with cane into Abdct then horiz add x 4 slow reps       Shoulder Exercises: Sidelying   Other Sidelying Exercises Rt open book x 3 reps, range to tolerance with AAROM to return to neutral.        Shoulder Exercises: Standing   Flexion AROM;Right;5 reps    Extension AAROM;Both;5 reps   cane   Other Standing Exercises Rt hand on hip x 2 reps of 20 sec for ROM      Shoulder Exercises: Isometric Strengthening   Flexion 5X5"    Extension 5X5"    External Rotation 5X5"    Internal Rotation 5X5"    ABduction 5X5"      Shoulder Exercises: Stretch   Table Stretch - Flexion 4 reps;20 seconds    Other Shoulder Stretches Rt bicep stretch holding railing x 30 sec x 3 reps       Manual Therapy   Joint Mobilization GH jt mobs Rt grade II/III to increase ROM/mobility    Soft tissue mobilization STM to Rt pec, lat, teres major, subscap, upper  and mid trap, levator, rhomboid     Myofascial Release anterior chest to anterior shoulder/arm, Lat     Scapular Mobilization Rt     Passive ROM Rt shoulder ext, ER,horiz abdct, scaption, flexion to tissue limits and no pain                    PT Short Term Goals - 12/22/19 1014      PT SHORT TERM GOAL #1   Title Ind with initial HEP    Time 2    Status Achieved      PT SHORT TERM GOAL #2   Title PROM to protocol goals by 6 weeks (11/28/19)    Time 4    Period Weeks    Status Achieved      PT SHORT TERM GOAL #3   Title Patient ind in AAROM HEP for right shoulder    Time 8    Status On-going      PT SHORT TERM GOAL #4   Title Pt to demo full AROM of right elbow    Time 8    Period Weeks    Status Achieved             PT Long Term Goals - 12/22/19 1111      PT LONG TERM GOAL #1   Title Improve scapular control with patient to demonstrate elevation of Rt shoulder flexion and scaption to 90-100 deg with minimal upward movement of scapula    Time 6    Period Weeks    Status New      Target Date 02/02/20      PT LONG TERM GOAL #2   Title Increase AROM Rt shoulder to WFL's throughout    Time 6    Period Weeks    Status New    Target Date 02/02/20      PT LONG TERM GOAL #3   Title 4/5 to 4+/5 strength Rt UE    Time 6    Period Weeks    Status New    Target Date 02/09/20      PT LONG TERM GOAL #4   Title Independent in HEP    Time 6    Period Weeks    Status New    Target Date 02/02/20      PT LONG TERM GOAL #5   Title Improve FOTO to </= 37% limitation    Time 6    Period Weeks    Status New    Target Date 02/02/20                 Plan - 12/29/19 0939    Clinical Impression Statement Pt now 10 wks s/p Rt RTC repair.  ROM slowly progressing.  Pt reported some discomfort in posterior Rt shoulder with all motions today, likely a carry-over from pain experienced from grabbing door quickly this morning.  Progressing gradually towards LTGs.    Comorbidities anxiety, depression, bil thumb pain    Rehab Potential Excellent    PT Frequency 2x / week    PT Duration Other (comment)   18 weeks   PT Treatment/Interventions ADLs/Self Care Home Management;Cryotherapy;Electrical Stimulation;Iontophoresis 4mg /ml Dexamethasone;Moist Heat;Therapeutic exercise;Therapeutic activities;Neuromuscular re-education;Patient/family education;Manual techniques;Taping;Dry needling;Passive range of motion;Vasopneumatic Device;Joint Manipulations;Spinal Manipulations    PT Next Visit Plan continue with shoulder rehab addressing ROM; scapular control and rhythm; strength and function. *MD note for 01/10/20 appt  -FOTO   PT Home Exercise Plan [id number]    Consulted and  Agree with Plan of Care Patient           Patient will benefit from skilled therapeutic intervention in order to improve the following deficits and impairments:  Decreased range of motion, Increased edema, Impaired UE functional use, Impaired flexibility, Decreased strength, Pain, Postural dysfunction  Visit  Diagnosis: Stiffness of right shoulder, not elsewhere classified  Acute pain of right shoulder  Muscle weakness (generalized)     Problem List Patient Active Problem List   Diagnosis Date Noted  . Impingement syndrome of right shoulder 03/28/2019  . Primary osteoarthritis of both first carpometacarpal joints 07/29/2018  . Tendonitis of both wrists 06/01/2018  . Numbness and tingling in both hands 06/01/2018  . Eustachian tube dysfunction, bilateral 08/17/2017  . Angio-edema 08/10/2017  . Environmental allergies 07/21/2017  . Seasonal allergies 07/21/2017  . Seasonal allergic rhinitis 07/09/2017  . Post-nasal drip 07/09/2017  . 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 Trapezius muscle spasm 08/21/2016  . Vitamin D deficiency 08/01/2014  . B12 deficiency 08/01/2014  . Prolonged Q-T interval on ECG 10/06/2013  . Major depressive disorder, recurrent episode, moderate (HCC) 12/27/2012  . Generalized anxiety disorder 12/27/2012  . Anxiety 04/27/2012  . Essential hypertension 04/27/2012  . Major depressive disorder 03/31/2012  . Chest pain 03/31/2012  . Shortness of breath 03/31/2012  . Migraine 03/31/2012  . PALPITATIONS 08/02/2009   Mayer Camel, PTA 12/29/19 10:23 AM   [facility] [place] [address] [place] [address] [place], [place], [address] Phone: [phone]   Fax:  [phone]  Name: Darlene Ochoa MRN: [id number] Date of Birth: 10-09-1965

## 2020-01-02 ENCOUNTER — Encounter: Payer: BC Managed Care – PPO | Admitting: Physical Therapy

## 2020-01-03 ENCOUNTER — Ambulatory Visit: Payer: BC Managed Care – PPO | Admitting: Physical Therapy

## 2020-01-03 ENCOUNTER — Other Ambulatory Visit: Payer: Self-pay

## 2020-01-03 ENCOUNTER — Encounter: Payer: Self-pay | Admitting: Physical Therapy

## 2020-01-03 DIAGNOSIS — M6281 Muscle weakness (generalized): Secondary | ICD-10-CM | POA: Diagnosis not present

## 2020-01-03 DIAGNOSIS — M25611 Stiffness of right shoulder, not elsewhere classified: Secondary | ICD-10-CM | POA: Diagnosis not present

## 2020-01-03 DIAGNOSIS — M79642 Pain in left hand: Secondary | ICD-10-CM

## 2020-01-03 DIAGNOSIS — M25511 Pain in right shoulder: Secondary | ICD-10-CM | POA: Diagnosis not present

## 2020-01-03 DIAGNOSIS — R600 Localized edema: Secondary | ICD-10-CM

## 2020-01-03 DIAGNOSIS — M25541 Pain in joints of right hand: Secondary | ICD-10-CM

## 2020-01-03 NOTE — Therapy (Signed)
[place] [address] [place], [place], [address] Phone: [phone]   Fax:  [phone]  Physical Therapy Treatment  Patient Details  Name: Darlene Ochoa MRN: [id number] Date of Birth: 1966-01-19 Referring Provider (PT): Ophelia Charter, MD   Encounter Date: 01/03/2020   PT End of Session - 01/03/20 0850    Visit Number 19    Number of Visits 28    Date for PT Re-Evaluation 02/02/20    Authorization Type BCBS    PT Start Time 0850    PT Stop Time 0930    PT Time Calculation (min) 40 min    Activity Tolerance Patient tolerated treatment well    Behavior During Therapy [facility] for tasks assessed/performed           Past Medical History:  Diagnosis Date   Anxiety    Chest pain 03/31/2012   Depression    H/O prolonged Q-T interval on ECG 03/31/2012   Hypertension    Major depressive disorder 03/31/2012   Seasonal allergies     Past Surgical History:  Procedure Laterality Date   [place] Right 2021    There were no vitals filed for this visit.   Subjective Assessment - 01/03/20 0851    Subjective Constant 2-3/10 soreness. I know I overdo it.    Pertinent History HTN, anxiety, depression    Patient Stated Goals New grandbaby coming in Oct so to hurry up and be well    Currently in Pain? Yes    Pain Score 2     Pain Location Shoulder    Pain Orientation Right    Pain Descriptors / Indicators Aching;Sore    Pain Type Surgical pain    Pain Onset More than a month ago    Pain Frequency Constant    Aggravating Factors  overuse              [facility] PT Assessment - 01/03/20 0001      Assessment   Medical Diagnosis Rt RCR/DCE/SAD    Referring Provider (PT) Ophelia Charter, MD    Onset Date/Surgical Date 10/17/19    Hand Dominance Right    Next MD Visit 01/10/20      Observation/Other Assessments   Focus on Therapeutic Outcomes (FOTO)  47% limited       AROM   AROM  Assessment Site Shoulder    Right/Left Shoulder Right    Right Shoulder Extension 40 Degrees    Right Shoulder Flexion 142 Degrees   130 standing   Right Shoulder ABduction 116 Degrees   146 standing   Right Shoulder Internal Rotation 35 Degrees    Right Shoulder External Rotation 40 Degrees   at 90 deg abd     PROM   Right Shoulder Flexion 153 Degrees    Right Shoulder ABduction 142 Degrees    Right Shoulder Internal Rotation 55 Degrees    Right Shoulder External Rotation 62 Degrees   at 90 deg ABD                        [facility] Adult PT Treatment/Exercise - 01/03/20 0001      Shoulder Exercises: Standing   Protraction AROM;Both;10 reps    Protraction Limitations verbal cues for form    Internal Rotation Both;AAROM;10 reps    Flexion AROM;Both;20 reps    Flexion Limitations to 90 deg pre/post serratus and shrugs; using mirror for feedback  Extension AAROM;Both;10 reps   cane   Other Standing Exercises shrugs with active depression x 10      Shoulder Exercises: Pulleys   Flexion 3 minutes    Scaption 3 minutes      Shoulder Exercises: Stretch   Internal Rotation Stretch 2 reps    Internal Rotation Stretch Limitations 20 sec; then 10x active stretch with strap    Other Shoulder Stretches Pecs stretch on large foam roller : low x 60 sec; mid x 30 sec with pillow support; scaption position not tolerated (pain in post shoulder)      Manual Therapy   Passive ROM to right shoulder flex/ABD,ER/IR                    PT Short Term Goals - 01/03/20 0855      PT SHORT TERM GOAL #1   Title Ind with initial HEP    Status Achieved      PT SHORT TERM GOAL #2   Title PROM to protocol goals by 6 weeks (11/28/19)    Status Achieved      PT SHORT TERM GOAL #3   Title Patient ind in [place] for right shoulder    Baseline Pt reporting increased compliance with stretches    Status Partially Met      PT SHORT TERM GOAL #4   Title Pt to demo full AROM of right  elbow    Status Achieved             PT Long Term Goals - 01/03/20 1157      PT LONG TERM GOAL #1   Title Improve scapular control with patient to demonstrate elevation of Rt shoulder flexion and scaption to 90-100 deg with minimal upward movement of scapula    Status On-going      PT LONG TERM GOAL #2   Title Increase AROM Rt shoulder to WFL's throughout    Status On-going      PT LONG TERM GOAL #3   Title 4/5 to 4+/5 strength Rt UE    Status On-going      PT LONG TERM GOAL #4   Title Independent in HEP    Status Partially Met      PT LONG TERM GOAL #5   Title Improve FOTO to </= 37% limitation    Baseline 47% limited    Status On-going                 Plan - 01/03/20 1149    Clinical Impression Statement Patient is 11 weeks s/p Rt RTC repair. She is progressing slowly with motion, but is still limited with ROM in all planes. She has some scapular dyskinesis beyond 90 deg flex and ABD. She was able to tolerated new IR and pectoralis stretches today without pain and understands the importance of compliance with HEP. She will benefit from continued PT to gain ROM and strength to return her to her PLOF. Additionally, patient continues to report bil thumb pain. She would also benefit from DN/manual therapy to her hands to increase tolerance to shoulder strengthening.    Personal Factors and Comorbidities Comorbidity 3+    Comorbidities anxiety, depression, bil thumb pain    Examination-Activity Limitations Bathing;Carry;Lift;Reach Overhead    PT Frequency 2x / week    PT Duration Other (comment)    PT Treatment/Interventions ADLs/Self Care Home Management;Cryotherapy;Electrical Stimulation;Iontophoresis 101m/ml Dexamethasone;Moist Heat;Therapeutic exercise;Therapeutic activities;Neuromuscular re-education;Patient/family education;Manual techniques;Taping;Dry needling;Passive range of motion;Vasopneumatic Device;Joint Manipulations;Spinal Manipulations  PT Next Visit Plan  continue with shoulder rehab addressing ROM; scapular control and rhythm; strength and function; DN to hands as indicated.    PT Home Exercise Plan [email or web address]    Consulted and Agree with Plan of Care Patient           Patient will benefit from skilled therapeutic intervention in order to improve the following deficits and impairments:  Decreased range of motion, Increased edema, Impaired UE functional use, Impaired flexibility, Decreased strength, Pain, Postural dysfunction  Visit Diagnosis: Stiffness of right shoulder, not elsewhere classified  Acute pain of right shoulder  Muscle weakness (generalized)  Localized edema  Pain in joints of right hand  Pain in left hand     Problem List Patient Active Problem List   Diagnosis Date Noted   Impingement syndrome of right shoulder 03/28/2019   Primary osteoarthritis of both first carpometacarpal joints 07/29/2018   Tendonitis of both wrists 06/01/2018   Numbness and tingling in both hands 06/01/2018   Eustachian tube dysfunction, bilateral 08/17/2017   Angio-edema 08/10/2017   Environmental allergies 07/21/2017   Seasonal allergies 07/21/2017   Seasonal allergic rhinitis 07/09/2017   Post-nasal drip 07/09/2017   Submental mass 12/07/2016   Trapezius muscle spasm 08/21/2016   Vitamin D deficiency 08/01/2014   B12 deficiency 08/01/2014   Prolonged Q-T interval on ECG 10/06/2013   Major depressive disorder, recurrent episode, moderate ([facility]) 12/27/2012   Generalized anxiety disorder 12/27/2012   Anxiety 04/27/2012   Essential hypertension 04/27/2012   Major depressive disorder 03/31/2012   Chest pain 03/31/2012   Shortness of breath 03/31/2012   Migraine 03/31/2012   PALPITATIONS 08/02/2009   Madelyn Flavors PT 01/03/2020, 12:02 PM  [place], [place], [address] Phone: [phone]   Fax:  [phone]  Name: Darlene Ochoa MRN: [id number] Date of Birth: June 02, 1965

## 2020-01-05 ENCOUNTER — Encounter: Payer: BC Managed Care – PPO | Admitting: Physical Therapy

## 2020-01-10 DIAGNOSIS — M24111 Other articular cartilage disorders, right shoulder: Secondary | ICD-10-CM | POA: Diagnosis not present

## 2020-01-11 ENCOUNTER — Encounter: Payer: BC Managed Care – PPO | Admitting: Physical Therapy

## 2020-01-12 ENCOUNTER — Ambulatory Visit (INDEPENDENT_AMBULATORY_CARE_PROVIDER_SITE_OTHER): Payer: BC Managed Care – PPO | Admitting: Rehabilitative and Restorative Service Providers"

## 2020-01-12 ENCOUNTER — Other Ambulatory Visit: Payer: Self-pay

## 2020-01-12 ENCOUNTER — Encounter: Payer: Self-pay | Admitting: Rehabilitative and Restorative Service Providers"

## 2020-01-12 DIAGNOSIS — M25511 Pain in right shoulder: Secondary | ICD-10-CM

## 2020-01-12 DIAGNOSIS — M6281 Muscle weakness (generalized): Secondary | ICD-10-CM

## 2020-01-12 DIAGNOSIS — M79642 Pain in left hand: Secondary | ICD-10-CM

## 2020-01-12 DIAGNOSIS — R600 Localized edema: Secondary | ICD-10-CM

## 2020-01-12 DIAGNOSIS — M25541 Pain in joints of right hand: Secondary | ICD-10-CM

## 2020-01-12 DIAGNOSIS — M25611 Stiffness of right shoulder, not elsewhere classified: Secondary | ICD-10-CM | POA: Diagnosis not present

## 2020-01-12 NOTE — Therapy (Signed)
[place], [place], [address] Phone: [phone]   Fax:  [phone]  Physical Therapy Treatment  Patient Details  Name: Darlene Ochoa MRN: [id number] Date of Birth: 1965/08/07 Referring Provider (PT): Ophelia Charter, MD   Encounter Date: 01/12/2020   PT End of Session - 01/12/20 0940    Visit Number 20    Number of Visits 28    Date for PT Re-Evaluation 02/02/20    Authorization Type BCBS    PT Start Time 0933    PT Stop Time 1015    PT Time Calculation (min) 42 min    Activity Tolerance Patient tolerated treatment well           Past Medical History:  Diagnosis Date  . Anxiety   . Chest pain 03/31/2012  . Depression   . H/O prolonged Q-T interval on ECG 03/31/2012  . Hypertension   . Major depressive disorder 03/31/2012  . Seasonal allergies     Past Surgical History:  Procedure Laterality Date  . CESAREAN SECTION  1998  . ROTATOR CUFF REPAIR Right 2021    There were no vitals filed for this visit.   Subjective Assessment - 01/12/20 0941    Subjective Sore - caught her daughter when she fainted. Shoulder has been sore since. Improving. She saw Dr Griffin Basil Tuesday and he released her - will see her again in 6 months.    Currently in Pain? Yes    Pain Score 3     Pain Location Shoulder    Pain Orientation Right    Pain Descriptors / Indicators Aching;Sore    Pain Type Surgical pain;Chronic pain                             [facility] Adult PT Treatment/Exercise - 01/12/20 0001      Shoulder Exercises: Pulleys   Flexion 3 minutes    Scaption 3 minutes      Shoulder Exercises: Stretch   External Rotation Stretch 3 reps;30 seconds   PT assist pt supine      Moist Heat Therapy   Number Minutes Moist Heat 10 Minutes    Moist Heat Location Shoulder   Rt     Manual Therapy   Manual therapy comments pt prone; sidelying and supine    Joint Mobilization GH jt mobs Rt  grade II/III to increase ROM/mobility    Soft tissue mobilization STM to Rt pec, lat, teres major, subscap, upper and mid trap, levator, rhomboid     Myofascial Release anterior chest to anterior shoulder/arm, Lat     Scapular Mobilization Rt     Passive ROM to right shoulder flex/ABD,ER/IR in scapular plane                     PT Short Term Goals - 01/03/20 0855      PT SHORT TERM GOAL #1   Title Ind with initial HEP    Status Achieved      PT SHORT TERM GOAL #2   Title PROM to protocol goals by 6 weeks (11/28/19)    Status Achieved      PT SHORT TERM GOAL #3   Title Patient ind in [place] HEP for right shoulder    Baseline Pt reporting increased compliance with stretches    Status Partially Met      PT SHORT TERM GOAL #4   Title Pt to  demo full AROM of right elbow    Status Achieved             PT Long Term Goals - 01/03/20 1157      PT LONG TERM GOAL #1   Title Improve scapular control with patient to demonstrate elevation of Rt shoulder flexion and scaption to 90-100 deg with minimal upward movement of scapula    Status On-going      PT LONG TERM GOAL #2   Title Increase AROM Rt shoulder to WFL's throughout    Status On-going      PT LONG TERM GOAL #3   Title 4/5 to 4+/5 strength Rt UE    Status On-going      PT LONG TERM GOAL #4   Title Independent in HEP    Status Partially Met      PT LONG TERM GOAL #5   Title Improve FOTO to </= 37% limitation    Baseline 47% limited    Status On-going                 Plan - 01/12/20 0943    Clinical Impression Statement Patient returns with increased soreness in the Rt shoulder after she caught her daughter when she fainted. Saw MD this week and he was OK with her progress. She will see him again in 6 months. Focus today on manual work and PROM to increased ER specifically. Patient has significant joint and muscular tightness Rt shoulder. Will benefit from more manual work including joint mobs; deep  tissue work; DN; PROM/stretching    Rehab Potential Excellent    PT Frequency 2x / week    PT Duration Other (comment)    PT Treatment/Interventions ADLs/Self Care Home Management;Cryotherapy;Electrical Stimulation;Iontophoresis 4mg /ml Dexamethasone;Moist Heat;Therapeutic exercise;Therapeutic activities;Neuromuscular re-education;Patient/family education;Manual techniques;Taping;Dry needling;Passive range of motion;Vasopneumatic Device;Joint Manipulations;Spinal Manipulations    PT Next Visit Plan continue with shoulder rehab addressing ROM; scapular control and rhythm; strength and function; DN to hands as indicated.    PT Home Exercise Plan [email or web address]    Consulted and Agree with Plan of Care Patient           Patient will benefit from skilled therapeutic intervention in order to improve the following deficits and impairments:     Visit Diagnosis: Stiffness of right shoulder, not elsewhere classified  Acute pain of right shoulder  Muscle weakness (generalized)  Localized edema  Pain in joints of right hand  Pain in left hand     Problem List Patient Active Problem List   Diagnosis Date Noted  . Impingement syndrome of right shoulder 03/28/2019  . Primary osteoarthritis of both first carpometacarpal joints 07/29/2018  . Tendonitis of both wrists 06/01/2018  . Numbness and tingling in both hands 06/01/2018  . Eustachian tube dysfunction, bilateral 08/17/2017  . Angio-edema 08/10/2017  . Environmental allergies 07/21/2017  . Seasonal allergies 07/21/2017  . Seasonal allergic rhinitis 07/09/2017  . Post-nasal drip 07/09/2017  . 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 Trapezius muscle spasm 08/21/2016  . Vitamin D deficiency 08/01/2014  . B12 deficiency 08/01/2014  . Prolonged Q-T interval on ECG 10/06/2013  . Major depressive disorder, recurrent episode, moderate ([place]) 12/27/2012  . Generalized anxiety disorder 12/27/2012  . Anxiety 04/27/2012  . Essential hypertension 04/27/2012    . Major depressive disorder 03/31/2012  . Chest pain 03/31/2012  . Shortness of breath 03/31/2012  . Migraine 03/31/2012  . PALPITATIONS 08/02/2009    Keighley Deckman Nilda Simmer PT, MPH  01/12/2020, 10:14 AM  [facility] [place], [place], [address] Phone: [phone]   Fax:  [phone]  Name: Darlene Ochoa MRN: [id number] Date of Birth: 01/27/1966

## 2020-01-17 ENCOUNTER — Encounter: Payer: BC Managed Care – PPO | Admitting: Physical Therapy

## 2020-01-19 ENCOUNTER — Encounter: Payer: BC Managed Care – PPO | Admitting: Rehabilitative and Restorative Service Providers"

## 2020-01-24 ENCOUNTER — Other Ambulatory Visit: Payer: Self-pay

## 2020-01-24 ENCOUNTER — Encounter: Payer: Self-pay | Admitting: Rehabilitative and Restorative Service Providers"

## 2020-01-24 ENCOUNTER — Ambulatory Visit (INDEPENDENT_AMBULATORY_CARE_PROVIDER_SITE_OTHER): Payer: BC Managed Care – PPO | Admitting: Rehabilitative and Restorative Service Providers"

## 2020-01-24 DIAGNOSIS — R600 Localized edema: Secondary | ICD-10-CM | POA: Diagnosis not present

## 2020-01-24 DIAGNOSIS — M25511 Pain in right shoulder: Secondary | ICD-10-CM

## 2020-01-24 DIAGNOSIS — M6281 Muscle weakness (generalized): Secondary | ICD-10-CM | POA: Diagnosis not present

## 2020-01-24 DIAGNOSIS — M25611 Stiffness of right shoulder, not elsewhere classified: Secondary | ICD-10-CM

## 2020-01-24 NOTE — Patient Instructions (Signed)
Access Code: [email or web address]: [email or web address]: 10/05/2021Prepared by: Korrey Schleicher HoltExercises  Seated Cervical Retraction - 2 x daily - 7 x weekly - 1-2 sets - 5-10 reps - 10 sec hold  Seated Scapular Retraction - 2 x daily - 7 x weekly - 1-2 sets - 10 reps - 10 sec hold  Seated Shoulder External Rotation AAROM with Cane and Hand in Neutral - 2 x daily - 7 x weekly - 1 sets - 5-10 reps - 5-10 sec hold  Seated Shoulder Flexion Towel Slide at Table Top Full Range of Motion - 2 x daily - 7 x weekly - 1 sets - 5-10 reps - 10 sec hold  Circular Shoulder Pendulum with Table Support - 3-4 x daily - 7 x weekly - 1 sets - 20-30 reps  Supine Scapular Retraction - 2 x daily - 7 x weekly - 1 sets - 10 reps - 5-10 sec hold  Standing Shoulder Extension with Dowel - 2 x daily - 7 x weekly - 1 sets - 5-10 reps - 5-10 sec hold  Standing Shoulder Internal Rotation AAROM with Dowel - 2 x daily - 7 x weekly - 1 sets - 3 reps - 30 sec hold  Standing Bilateral Shoulder Internal Rotation AAROM with Dowel - 2 x daily - 7 x weekly - 1 sets - 5 reps - 5 sec hold  Isometric Shoulder Abduction at Wall - 2 x daily - 7 x weekly - 1 sets - 5-10 reps - 5 sec hold  Isometric Shoulder External Rotation at Wall - 2 x daily - 7 x weekly - 1 sets - 5-10 reps - 5 sec hold  Isometric Shoulder Flexion at Wall - 2 x daily - 7 x weekly - 1 sets - 5-10 reps - 5 sec hold  Cat-Camel to Child's Pose - 2 x daily - 7 x weekly - 1 sets - 3 reps - 30 sec hold

## 2020-01-24 NOTE — Therapy (Signed)
[place], [place], [address] Phone: [phone]   Fax:  [phone]  Physical Therapy Treatment  Patient Details  Name: Darlene Ochoa MRN: [id number] Date of Birth: [birth year] Referring Provider (PT): Ophelia Charter, MD   Encounter Date: 01/24/2020   PT End of Session - 01/24/20 0849    Visit Number 11    Number of Visits 28    Date for PT Re-Evaluation 02/02/20    Authorization Type BCBS    PT Start Time 0847    PT Stop Time 0930    PT Time Calculation (min) 43 min    Activity Tolerance Patient tolerated treatment well           Past Medical History:  Diagnosis Date  . Anxiety   . Chest pain 03/31/2012  . Depression   . H/O prolonged Q-T interval on ECG 03/31/2012  . Hypertension   . Major depressive disorder 03/31/2012  . Seasonal allergies     Past Surgical History:  Procedure Laterality Date  . CESAREAN SECTION  1998  . ROTATOR CUFF REPAIR Right 2021    There were no vitals filed for this visit.   Subjective Assessment - 01/24/20 0849    Subjective Patient reports that the shoulder is feeling better. Still stiff. Having pain in the thumbs on an intermittent basis.    Currently in Pain? No/denies    Pain Score 0-No pain              [facility] PT Assessment - 01/24/20 0001      Assessment   Medical Diagnosis Rt RCR/DCE/SAD    Referring Provider (PT) Ophelia Charter, MD    Onset Date/Surgical Date 10/17/19    Hand Dominance Right      Palpation   Palpation comment muscular tightness Rt upper quarter - pecs; upper trap; leveator; teres; biceps; triceps                          [facility] Adult PT Treatment/Exercise - 01/24/20 0001      Self-Care   Self-Care --   thoracic extension over coregous ball 2-3 min      Therapeutic Activites    Therapeutic Activities --   myofacial ball release upper trap standing     Neuro Re-ed    Neuro Re-ed Details  working on  posterior shoulder girdle control with active movement of Rt UE       Shoulder Exercises: Pulleys   Flexion 3 minutes    Scaption 3 minutes      Shoulder Exercises: Stretch   Other Shoulder Stretches child's pose 20 sec x 3 reps       Manual Therapy   Manual therapy comments pt prone; sidelying and supine    Joint Mobilization GH jt mobs Rt grade II/III to increase ROM/mobility    Soft tissue mobilization STM to Rt pec, lat, teres major, subscap, upper and mid trap, levator, rhomboid     Myofascial Release anterior chest to anterior shoulder/arm, Lat     Scapular Mobilization Rt     Passive ROM to right shoulder flex/ABD,ER/IR in scapular plane                   PT Education - 01/24/20 0930    Education Details HEP myofacial release    Person(s) Educated Patient    Methods Explanation;Demonstration;Tactile cues;Verbal cues;Handout    Comprehension Verbalized understanding;Returned demonstration;Verbal cues required;Tactile cues  required            PT Short Term Goals - 01/03/20 0855      PT SHORT TERM GOAL #1   Title Ind with initial HEP    Status Achieved      PT SHORT TERM GOAL #2   Title PROM to protocol goals by 6 weeks (11/28/19)    Status Achieved      PT SHORT TERM GOAL #3   Title Patient ind in [place] for right shoulder    Baseline Pt reporting increased compliance with stretches    Status Partially Met      PT SHORT TERM GOAL #4   Title Pt to demo full AROM of right elbow    Status Achieved             PT Long Term Goals - 01/03/20 1157      PT LONG TERM GOAL #1   Title Improve scapular control with patient to demonstrate elevation of Rt shoulder flexion and scaption to 90-100 deg with minimal upward movement of scapula    Status On-going      PT LONG TERM GOAL #2   Title Increase AROM Rt shoulder to WFL's throughout    Status On-going      PT LONG TERM GOAL #3   Title 4/5 to 4+/5 strength Rt UE    Status On-going      PT LONG TERM  GOAL #4   Title Independent in HEP    Status Partially Met      PT LONG TERM GOAL #5   Title Improve FOTO to </= 37% limitation    Baseline 47% limited    Status On-going                 Plan - 01/24/20 0924    Clinical Impression Statement Continued soreness through the Rt shoulder girdle. Good increase in AROM with manual work and ROM, myofacial ball release work.    Rehab Potential Excellent    PT Frequency 2x / week    PT Duration 6 weeks    PT Treatment/Interventions ADLs/Self Care Home Management;Cryotherapy;Electrical Stimulation;Iontophoresis 21m/ml Dexamethasone;Moist Heat;Therapeutic exercise;Therapeutic activities;Neuromuscular re-education;Patient/family education;Manual techniques;Taping;Dry needling;Passive range of motion;Vasopneumatic Device;Joint Manipulations;Spinal Manipulations    PT Next Visit Plan continue with shoulder rehab addressing ROM; scapular control and rhythm; strength and function; DN to hands as indicated.    PT Home Exercise Plan [email or web address]    Consulted and Agree with Plan of Care Patient           Patient will benefit from skilled therapeutic intervention in order to improve the following deficits and impairments:     Visit Diagnosis: Stiffness of right shoulder, not elsewhere classified  Acute pain of right shoulder  Muscle weakness (generalized)  Localized edema     Problem List Patient Active Problem List   Diagnosis Date Noted  . Impingement syndrome of right shoulder 03/28/2019  . Primary osteoarthritis of both first carpometacarpal joints 07/29/2018  . Tendonitis of both wrists 06/01/2018  . Numbness and tingling in both hands 06/01/2018  . Eustachian tube dysfunction, bilateral 08/17/2017  . Angio-edema 08/10/2017  . Environmental allergies 07/21/2017  . Seasonal allergies 07/21/2017  . Seasonal allergic rhinitis 07/09/2017  . Post-nasal drip 07/09/2017  . 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 Trapezius muscle spasm  08/21/2016  . Vitamin D deficiency 08/01/2014  . B12 deficiency 08/01/2014  . Prolonged Q-T interval on ECG 10/06/2013  . Major depressive disorder, recurrent episode, moderate (  [place]) 12/27/2012  . Generalized anxiety disorder 12/27/2012  . Anxiety 04/27/2012  . Essential hypertension 04/27/2012  . Major depressive disorder 03/31/2012  . Chest pain 03/31/2012  . Shortness of breath 03/31/2012  . Migraine 03/31/2012  . PALPITATIONS 08/02/2009    Sylvie Mifsud Nilda Simmer PT, MPH  01/24/2020, 9:35 AM  [facility] [place], [place], [address] Phone: [phone]   Fax:  [phone]  Name: Darlene Ochoa MRN: [id number] Date of Birth: May 16, 1965

## 2020-01-25 DIAGNOSIS — F411 Generalized anxiety disorder: Secondary | ICD-10-CM | POA: Diagnosis not present

## 2020-01-25 DIAGNOSIS — F3342 Major depressive disorder, recurrent, in full remission: Secondary | ICD-10-CM | POA: Diagnosis not present

## 2020-01-26 ENCOUNTER — Other Ambulatory Visit: Payer: Self-pay

## 2020-01-26 ENCOUNTER — Encounter: Payer: Self-pay | Admitting: Rehabilitative and Restorative Service Providers"

## 2020-01-26 ENCOUNTER — Ambulatory Visit: Payer: BC Managed Care – PPO | Admitting: Rehabilitative and Restorative Service Providers"

## 2020-01-26 DIAGNOSIS — M25511 Pain in right shoulder: Secondary | ICD-10-CM | POA: Diagnosis not present

## 2020-01-26 DIAGNOSIS — R600 Localized edema: Secondary | ICD-10-CM

## 2020-01-26 DIAGNOSIS — M25611 Stiffness of right shoulder, not elsewhere classified: Secondary | ICD-10-CM

## 2020-01-26 DIAGNOSIS — M6281 Muscle weakness (generalized): Secondary | ICD-10-CM

## 2020-01-26 NOTE — Therapy (Signed)
[place], [place], [address] Phone: [phone]   Fax:  [phone]  Physical Therapy Treatment  Patient Details  Name: Darlene Ochoa MRN: [id number] Date of Birth: [birth year] Referring Provider (PT): Ophelia Charter, MD   Encounter Date: 01/26/2020   PT End of Session - 01/26/20 0853    Visit Number 22    Number of Visits 28    Date for PT Re-Evaluation 02/02/20    Authorization Type BCBS    PT Start Time 0846    PT Stop Time 0934    PT Time Calculation (min) 48 min    Activity Tolerance Patient tolerated treatment well           Past Medical History:  Diagnosis Date  . Anxiety   . Chest pain 03/31/2012  . Depression   . H/O prolonged Q-T interval on ECG 03/31/2012  . Hypertension   . Major depressive disorder 03/31/2012  . Seasonal allergies     Past Surgical History:  Procedure Laterality Date  . CESAREAN SECTION  1998  . ROTATOR CUFF REPAIR Right 2021    There were no vitals filed for this visit.   Subjective Assessment - 01/26/20 0854    Subjective Sore following last treatment but felt looser and was able to use Rt UE for more functional activities    Currently in Pain? No/denies    Pain Score 0-No pain    Pain Location Shoulder    Pain Orientation Right    Pain Descriptors / Indicators Tightness                             [facility] Adult PT Treatment/Exercise - 01/26/20 0001      Shoulder Exercises: Pulleys   Flexion 3 minutes    Scaption 3 minutes    Other Pulley Exercises IR stretch 20 sec x 3 reps       Shoulder Exercises: Stretch   Internal Rotation Stretch 3 reps   with strap ball at ant shd to prevent shd going fwd    Other Shoulder Stretches 3 way doorway stretch 20-30 sec x 3 reps each position - to pt tolerance     Other Shoulder Stretches child's pose 20 sec x 3 reps       Moist Heat Therapy   Number Minutes Moist Heat 10 Minutes    Moist  Heat Location Shoulder      Manual Therapy   Manual therapy comments pt prone; sidelying and supine    Joint Mobilization GH jt mobs Rt grade II/III to increase ROM/mobility    Soft tissue mobilization STM to Rt pec, lat, teres major, subscap, upper and mid trap, levator, rhomboid     Myofascial Release anterior chest to anterior shoulder/arm, Lat     Scapular Mobilization Rt     Passive ROM to right shoulder flex/ABD,ER/IR in scapular plane                   PT Education - 01/26/20 0905    Education Details HEP    Person(s) Educated Patient    Methods Explanation;Demonstration;Tactile cues;Verbal cues;Handout    Comprehension Verbalized understanding;Returned demonstration;Verbal cues required;Tactile cues required            PT Short Term Goals - 01/03/20 0855      PT SHORT TERM GOAL #1   Title Ind with initial HEP    Status Achieved  PT SHORT TERM GOAL #2   Title PROM to protocol goals by 6 weeks (11/28/19)    Status Achieved      PT SHORT TERM GOAL #3   Title Patient ind in [place] for right shoulder    Baseline Pt reporting increased compliance with stretches    Status Partially Met      PT SHORT TERM GOAL #4   Title Pt to demo full AROM of right elbow    Status Achieved             PT Long Term Goals - 01/03/20 1157      PT LONG TERM GOAL #1   Title Improve scapular control with patient to demonstrate elevation of Rt shoulder flexion and scaption to 90-100 deg with minimal upward movement of scapula    Status On-going      PT LONG TERM GOAL #2   Title Increase AROM Rt shoulder to WFL's throughout    Status On-going      PT LONG TERM GOAL #3   Title 4/5 to 4+/5 strength Rt UE    Status On-going      PT LONG TERM GOAL #4   Title Independent in HEP    Status Partially Met      PT LONG TERM GOAL #5   Title Improve FOTO to </= 37% limitation    Baseline 47% limited    Status On-going                 Plan - 01/26/20 0856     Clinical Impression Statement Good response to manual work and passive stretching at last visit with improved shoulder mobility and function.    Rehab Potential Excellent    PT Frequency 2x / week    PT Duration 6 weeks    PT Treatment/Interventions ADLs/Self Care Home Management;Cryotherapy;Electrical Stimulation;Iontophoresis 25m/ml Dexamethasone;Moist Heat;Therapeutic exercise;Therapeutic activities;Neuromuscular re-education;Patient/family education;Manual techniques;Taping;Dry needling;Passive range of motion;Vasopneumatic Device;Joint Manipulations;Spinal Manipulations    PT Next Visit Plan continue with shoulder rehab addressing ROM; scapular control and rhythm; strength and function; DN to hands as indicated.    PT Home Exercise Plan [email or web address]    Consulted and Agree with Plan of Care Patient           Patient will benefit from skilled therapeutic intervention in order to improve the following deficits and impairments:     Visit Diagnosis: Stiffness of right shoulder, not elsewhere classified  Acute pain of right shoulder  Muscle weakness (generalized)  Localized edema     Problem List Patient Active Problem List   Diagnosis Date Noted  . Impingement syndrome of right shoulder 03/28/2019  . Primary osteoarthritis of both first carpometacarpal joints 07/29/2018  . Tendonitis of both wrists 06/01/2018  . Numbness and tingling in both hands 06/01/2018  . Eustachian tube dysfunction, bilateral 08/17/2017  . Angio-edema 08/10/2017  . Environmental allergies 07/21/2017  . Seasonal allergies 07/21/2017  . Seasonal allergic rhinitis 07/09/2017  . Post-nasal drip 07/09/2017  . 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 Trapezius muscle spasm 08/21/2016  . Vitamin D deficiency 08/01/2014  . B12 deficiency 08/01/2014  . Prolonged Q-T interval on ECG 10/06/2013  . Major depressive disorder, recurrent episode, moderate ([place] 12/27/2012  . Generalized anxiety disorder 12/27/2012  . Anxiety  04/27/2012  . Essential hypertension 04/27/2012  . Major depressive disorder 03/31/2012  . Chest pain 03/31/2012  . Shortness of breath 03/31/2012  . Migraine 03/31/2012  . PALPITATIONS 08/02/2009    Kinan Safley P HHelene Kelp  PT, MPH  01/26/2020, 9:30 AM  [facility] [place], [place], [address] Phone: [phone]   Fax:  [phone]  Name: Darlene Ochoa MRN: [id number] Date of Birth: Sep 18, 1965

## 2020-01-26 NOTE — Patient Instructions (Signed)
Access Code: [email or web address]: [email or web address]: 10/07/2021Prepared by: Tarrin Lebow HoltExercises  Seated Finger Composite Flexion Extension - 2 x daily - 7 x weekly - 3 sets - 15 reps  Wrist AROM Flexion Extension - 2 x daily - 7 x weekly - 3 sets - 15 reps  Seated Elbow Flexion Extension AROM - 2 x daily - 7 x weekly - 3 sets - 15 reps  Circular Shoulder Pendulum with Table Support - 2 x daily - 7 x weekly - 3 sets - 15 reps  Flexion-Extension Shoulder Pendulum with Table Support - 2 x daily - 7 x weekly - 3 sets - 15 reps  Horizontal Shoulder Pendulum with Table Support - 2 x daily - 7 x weekly - 3 sets - 15 reps  Seated Upper Trapezius Stretch - 2 x daily - 7 x weekly - 1 sets - 3 reps - 30 sec hold  Seated Scapular Retraction - 2 x daily - 7 x weekly - 1-3 sets - 10 reps  Seated Shoulder External Rotation AAROM with Cane and Hand in Neutral - 3-4 x daily - 7 x weekly - 1 sets - 5-10 reps - 10 sec hold  Seated Shoulder Flexion Towel Slide at Table Top - 3-4 x daily - 7 x weekly - 1 sets - 5-10 reps - 10 sec hold  Supine Shoulder Press with Dowel - 2 x daily - 7 x weekly - 1 sets - 10 reps  Supine Shoulder Flexion with Dowel - 2 x daily - 7 x weekly - 1 sets - 10 reps  Supine Shoulder External Rotation in 45 Degrees Abduction AAROM with Dowel - 2 x daily - 7 x weekly - 1 sets - 10 reps - 5-10 hold  Seated Shoulder Circles AAROM with Dowel into Wall - 2 x daily - 7 x weekly - 1 sets - 10 reps  Standing Bilateral Shoulder Internal Rotation AAROM with Dowel - 2 x daily - 7 x weekly - 1 sets - 10 reps  Standing Shoulder Extension with Dowel - 2 x daily - 7 x weekly - 1 sets - 10 reps  Isometric Shoulder Flexion at Wall - 2 x daily - 7 x weekly - 1 sets - 10 reps  Isometric Shoulder Extension at Wall - 2 x daily - 7 x weekly - 1 sets - 10 reps  Standing Isometric Shoulder Internal Rotation at Doorway - 2 x daily - 7 x weekly - 1 sets - 10 reps  Standing Isometric Shoulder External  Rotation with Doorway - 2 x daily - 7 x weekly - 1 sets - 10 reps - 5 hold  Supine Chest Stretch with Arms Behind Head - 2 x daily - 7 x weekly - 1 sets - 3 reps - 30 hold  Doorway Pec Stretch at 60 Degrees Abduction - 3 x daily - 7 x weekly - 3 reps - 1 sets  Doorway Pec Stretch at 90 Degrees Abduction - 3 x daily - 7 x weekly - 3 reps - 1 sets - 30 seconds hold  Doorway Pec Stretch at 120 Degrees Abduction - 3 x daily - 7 x weekly - 3 reps - 1 sets - 30 second hold hold

## 2020-01-30 ENCOUNTER — Other Ambulatory Visit: Payer: Self-pay

## 2020-01-30 ENCOUNTER — Encounter: Payer: Self-pay | Admitting: Rehabilitative and Restorative Service Providers"

## 2020-01-30 ENCOUNTER — Ambulatory Visit (INDEPENDENT_AMBULATORY_CARE_PROVIDER_SITE_OTHER): Payer: BC Managed Care – PPO | Admitting: Rehabilitative and Restorative Service Providers"

## 2020-01-30 DIAGNOSIS — M25611 Stiffness of right shoulder, not elsewhere classified: Secondary | ICD-10-CM | POA: Diagnosis not present

## 2020-01-30 DIAGNOSIS — M25511 Pain in right shoulder: Secondary | ICD-10-CM

## 2020-01-30 DIAGNOSIS — M6281 Muscle weakness (generalized): Secondary | ICD-10-CM | POA: Diagnosis not present

## 2020-01-30 NOTE — Therapy (Signed)
[place], [place], [address] Phone: [phone]   Fax:  [phone]  Physical Therapy Treatment  Patient Details  Name: Darlene Ochoa MRN: [id number] Date of Birth: 25-Feb-1966 Referring Provider (PT): Ophelia Charter, MD   Encounter Date: 01/30/2020   PT End of Session - 01/30/20 1520    Visit Number 23    Number of Visits 28    Date for PT Re-Evaluation 02/02/20    Authorization Type BCBS    PT Start Time 1435    PT Stop Time 1521    PT Time Calculation (min) 46 min           Past Medical History:  Diagnosis Date  . Anxiety   . Chest pain 03/31/2012  . Depression   . H/O prolonged Q-T interval on ECG 03/31/2012  . Hypertension   . Major depressive disorder 03/31/2012  . Seasonal allergies     Past Surgical History:  Procedure Laterality Date  . CESAREAN SECTION  1998  . ROTATOR CUFF REPAIR Right 2021    There were no vitals filed for this visit.   Subjective Assessment - 01/30/20 1434    Subjective "Just a little" soreness following last treatment. Painted her daughter's room over the weekend and has some soreness form painting.    Currently in Pain? No/denies    Pain Score 0-No pain    Pain Location Shoulder    Pain Orientation Right    Pain Descriptors / Indicators Sore                             [facility] Adult PT Treatment/Exercise - 01/30/20 0001      Shoulder Exercises: Pulleys   Flexion 3 minutes    Scaption 3 minutes    Other Pulley Exercises IR stretch 20 sec x 3 reps       Moist Heat Therapy   Number Minutes Moist Heat 10 Minutes    Moist Heat Location Shoulder      Manual Therapy   Manual therapy comments skilled palpation to assess response to DN/manual work     Joint Mobilization [place] jt mobs Rt grade II/III to increase ROM/mobility    Soft tissue mobilization STM to Rt pec, lat, teres major, subscap, upper and mid trap, levator, rhomboid      Myofascial Release anterior chest to anterior shoulder/arm, Lat     Scapular Mobilization Rt     Passive ROM to right shoulder flex/ABD,ER/IR in scapular plane             Trigger Point Dry Needling - 01/30/20 0001    Consent Given? Yes    Education Handout Provided Previously provided    Dry Needling Comments bilat     Opponens pollicis Response Twitch response elicited;Palpable increased muscle length    Flexor pollicis brevis Response Twitch response elicited;Palpable increased muscle length                  PT Short Term Goals - 01/03/20 0855      PT SHORT TERM GOAL #1   Title Ind with initial HEP    Status Achieved      PT SHORT TERM GOAL #2   Title PROM to protocol goals by 6 weeks (11/28/19)    Status Achieved      PT SHORT TERM GOAL #3   Title Patient ind in [place] for right shoulder  Baseline Pt reporting increased compliance with stretches    Status Partially Met      PT SHORT TERM GOAL #4   Title Pt to demo full AROM of right elbow    Status Achieved             PT Long Term Goals - 01/03/20 1157      PT LONG TERM GOAL #1   Title Improve scapular control with patient to demonstrate elevation of Rt shoulder flexion and scaption to 90-100 deg with minimal upward movement of scapula    Status On-going      PT LONG TERM GOAL #2   Title Increase AROM Rt shoulder to WFL's throughout    Status On-going      PT LONG TERM GOAL #3   Title 4/5 to 4+/5 strength Rt UE    Status On-going      PT LONG TERM GOAL #4   Title Independent in HEP    Status Partially Met      PT LONG TERM GOAL #5   Title Improve FOTO to </= 37% limitation    Baseline 47% limited    Status On-going                 Plan - 01/30/20 1436    Clinical Impression Statement Still some soreness and stiffness but shoulder is feeling better. She has a ball and heating pad at home. Working on the thoracic extension over the ball. Note improvement in scapular stability and  shoulder ROM.Trial of DN bilat thumb webspaces    Rehab Potential Good    PT Frequency 2x / week    PT Duration 6 weeks    PT Treatment/Interventions ADLs/Self Care Home Management;Cryotherapy;Electrical Stimulation;Iontophoresis 59m/ml Dexamethasone;Moist Heat;Therapeutic exercise;Therapeutic activities;Neuromuscular re-education;Patient/family education;Manual techniques;Taping;Dry needling;Passive range of motion;Vasopneumatic Device;Joint Manipulations;Spinal Manipulations    PT Next Visit Plan continue with shoulder rehab addressing ROM; scapular control and rhythm; strength and function; assess response to DN hands; continue DN to hands/add shoulder as indicated.    PT Home Exercise Plan [email or web address]    Consulted and Agree with Plan of Care Patient           Patient will benefit from skilled therapeutic intervention in order to improve the following deficits and impairments:     Visit Diagnosis: Stiffness of right shoulder, not elsewhere classified  Acute pain of right shoulder  Muscle weakness (generalized)     Problem List Patient Active Problem List   Diagnosis Date Noted  . Impingement syndrome of right shoulder 03/28/2019  . Primary osteoarthritis of both first carpometacarpal joints 07/29/2018  . Tendonitis of both wrists 06/01/2018  . Numbness and tingling in both hands 06/01/2018  . Eustachian tube dysfunction, bilateral 08/17/2017  . Angio-edema 08/10/2017  . Environmental allergies 07/21/2017  . Seasonal allergies 07/21/2017  . Seasonal allergic rhinitis 07/09/2017  . Post-nasal drip 07/09/2017  . 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 Trapezius muscle spasm 08/21/2016  . Vitamin D deficiency 08/01/2014  . B12 deficiency 08/01/2014  . Prolonged Q-T interval on ECG 10/06/2013  . Major depressive disorder, recurrent episode, moderate ([place] 12/27/2012  . Generalized anxiety disorder 12/27/2012  . Anxiety 04/27/2012  . Essential hypertension 04/27/2012  . Major depressive  disorder 03/31/2012  . Chest pain 03/31/2012  . Shortness of breath 03/31/2012  . Migraine 03/31/2012  . PALPITATIONS 08/02/2009    Texanna Hilburn PNilda SimmerPT, MPH  01/30/2020, 3:21 PM  [facility] [address]  [place], [address] Phone: [phone]   Fax:  [phone]  Name: Darlene Ochoa MRN: [id number] Date of Birth: Sep 28, 1965

## 2020-02-01 ENCOUNTER — Encounter: Payer: BC Managed Care – PPO | Admitting: Rehabilitative and Restorative Service Providers"

## 2020-02-07 ENCOUNTER — Ambulatory Visit: Payer: BC Managed Care – PPO | Admitting: Rehabilitative and Restorative Service Providers"

## 2020-02-07 ENCOUNTER — Other Ambulatory Visit: Payer: Self-pay

## 2020-02-07 ENCOUNTER — Encounter: Payer: Self-pay | Admitting: Rehabilitative and Restorative Service Providers"

## 2020-02-07 DIAGNOSIS — M25511 Pain in right shoulder: Secondary | ICD-10-CM

## 2020-02-07 DIAGNOSIS — M25611 Stiffness of right shoulder, not elsewhere classified: Secondary | ICD-10-CM | POA: Diagnosis not present

## 2020-02-07 DIAGNOSIS — M6281 Muscle weakness (generalized): Secondary | ICD-10-CM | POA: Diagnosis not present

## 2020-02-07 DIAGNOSIS — R600 Localized edema: Secondary | ICD-10-CM | POA: Diagnosis not present

## 2020-02-07 NOTE — Therapy (Signed)
[place], [place], [address] Phone: [phone]   Fax:  [phone]  Physical Therapy Treatment  Patient Details  Name: Darlene Ochoa MRN: [id number] Date of Birth: 1966/03/28 Referring Provider (PT): Ophelia Charter, MD   Encounter Date: 02/07/2020   PT End of Session - 02/07/20 0952    Visit Number 24    Number of Visits 32    Date for PT Re-Evaluation 03/20/20    Authorization Type BCBS    PT Start Time [phone]    PT Stop Time 1025    PT Time Calculation (min) 47 min    Activity Tolerance Patient tolerated treatment well           Past Medical History:  Diagnosis Date  . Anxiety   . Chest pain 03/31/2012  . Depression   . H/O prolonged Q-T interval on ECG 03/31/2012  . Hypertension   . Major depressive disorder 03/31/2012  . Seasonal allergies     Past Surgical History:  Procedure Laterality Date  . CESAREAN SECTION  1998  . ROTATOR CUFF REPAIR Right 2021    There were no vitals filed for this visit.       [facility] PT Assessment - 02/07/20 0001      Assessment   Medical Diagnosis Rt RCR/DCE/SAD    Referring Provider (PT) Ophelia Charter, MD    Onset Date/Surgical Date 10/17/19    Hand Dominance Right      AROM   Right Shoulder Extension 63 Degrees    Right Shoulder Flexion 154 Degrees    Right Shoulder ABduction 138 Degrees    Right Shoulder Internal Rotation --   thumb to T10   Right Shoulder External Rotation 75 Degrees      PROM   Right/Left Shoulder --   pt supine    Right Shoulder Extension 75 Degrees    Right Shoulder Flexion 158 Degrees    Right Shoulder ABduction 155 Degrees   in scapular plane    Right Shoulder Internal Rotation 76 Degrees    Right Shoulder External Rotation 78 Degrees   in scapular plane      Flexibility   Soft Tissue Assessment /Muscle Length --   capsular tightness noted Rt shoulder      Palpation   Palpation comment muscular tightness Rt upper  quarter - pecs; upper trap; leveator; teres; biceps; triceps                          [facility] Adult PT Treatment/Exercise - 02/07/20 0001      Shoulder Exercises: Stretch   Other Shoulder Stretches 3 way doorway stretch 20-30 sec x 3 reps each position - to pt tolerance       Moist Heat Therapy   Number Minutes Moist Heat 10 Minutes    Moist Heat Location Shoulder      Manual Therapy   Joint Mobilization GH jt mobs Rt grade II/III to increase ROM/mobility    Soft tissue mobilization STM to Rt pec, lat, teres major, subscap, upper and mid trap, levator, rhomboid     Myofascial Release anterior chest to anterior shoulder/arm, Lat     Scapular Mobilization Rt     Passive ROM to right shoulder flex/ABD,ER/IR in scapular plane                     PT Short Term Goals - 01/03/20 3729  PT SHORT TERM GOAL #1   Title Ind with initial HEP    Status Achieved      PT SHORT TERM GOAL #2   Title PROM to protocol goals by 6 weeks (11/28/19)    Status Achieved      PT SHORT TERM GOAL #3   Title Patient ind in [place] for right shoulder    Baseline Pt reporting increased compliance with stretches    Status Partially Met      PT SHORT TERM GOAL #4   Title Pt to demo full AROM of right elbow    Status Achieved             PT Long Term Goals - 02/07/20 1019      PT LONG TERM GOAL #1   Title Improve scapular control with patient to demonstrate elevation of Rt shoulder flexion and scaption to 90-100 deg with minimal upward movement of scapula    Time 6    Period Weeks    Status Revised    Target Date 03/20/20      PT LONG TERM GOAL #2   Title Increase AROM Rt shoulder to WFL's throughout    Time 6    Period Weeks    Status Revised    Target Date 03/20/20      PT LONG TERM GOAL #3   Title 4/5 to 4+/5 strength Rt UE    Time 6    Period Weeks    Status Revised    Target Date 03/20/20      PT LONG TERM GOAL #4   Title Independent in HEP    Time 6     Period Weeks    Status Revised    Target Date 03/20/20      PT LONG TERM GOAL #5   Title Improve FOTO to </= 37% limitation    Time 6    Period Weeks    Status Revised    Target Date 03/20/20                 Plan - 02/07/20 1020    Clinical Impression Statement Improving mobility and ROM with good gains in AROM/PROM. Patient continues to work on HEP for ROM and strengthening. She has some capsular tightness at end ranges Rt shoulder in all planes; stiffness; limited ROM and functional strength. Patient will benefit from continued treatment to address limitations.    Rehab Potential Good    PT Frequency 2x / week    PT Duration 6 weeks    PT Treatment/Interventions ADLs/Self Care Home Management;Cryotherapy;Electrical Stimulation;Iontophoresis 61m/ml Dexamethasone;Moist Heat;Therapeutic exercise;Therapeutic activities;Neuromuscular re-education;Patient/family education;Manual techniques;Taping;Dry needling;Passive range of motion;Vasopneumatic Device;Joint Manipulations;Spinal Manipulations    PT Next Visit Plan continue with shoulder rehab addressing ROM; scapular control and rhythm; strength and function; assess response to DN hands; continue DN to hands/add shoulder as indicated.    PT Home Exercise Plan [email or web address]    Consulted and Agree with Plan of Care Patient           Patient will benefit from skilled therapeutic intervention in order to improve the following deficits and impairments:     Visit Diagnosis: Stiffness of right shoulder, not elsewhere classified - Plan: PT plan of care cert/re-cert  Acute pain of right shoulder - Plan: PT plan of care cert/re-cert  Muscle weakness (generalized) - Plan: PT plan of care cert/re-cert  Localized edema - Plan: PT plan of care cert/re-cert     Problem List Patient Active Problem  List   Diagnosis Date Noted  . Impingement syndrome of right shoulder 03/28/2019  . Primary osteoarthritis of both first carpometacarpal  joints 07/29/2018  . Tendonitis of both wrists 06/01/2018  . Numbness and tingling in both hands 06/01/2018  . Eustachian tube dysfunction, bilateral 08/17/2017  . Angio-edema 08/10/2017  . Environmental allergies 07/21/2017  . Seasonal allergies 07/21/2017  . Seasonal allergic rhinitis 07/09/2017  . Post-nasal drip 07/09/2017  . 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 Trapezius muscle spasm 08/21/2016  . Vitamin D deficiency 08/01/2014  . B12 deficiency 08/01/2014  . Prolonged Q-T interval on ECG 10/06/2013  . Major depressive disorder, recurrent episode, moderate ([place]) 12/27/2012  . Generalized anxiety disorder 12/27/2012  . Anxiety 04/27/2012  . Essential hypertension 04/27/2012  . Major depressive disorder 03/31/2012  . Chest pain 03/31/2012  . Shortness of breath 03/31/2012  . Migraine 03/31/2012  . PALPITATIONS 08/02/2009    Archie Atilano Nilda Simmer PT, MPH  02/07/2020, 10:27 AM  [facility] [place], [place], [address] Phone: [phone]   Fax:  [phone]  Name: Darlene Ochoa MRN: [id number] Date of Birth: 05/28/1965

## 2020-02-09 ENCOUNTER — Ambulatory Visit: Payer: BC Managed Care – PPO | Admitting: Rehabilitative and Restorative Service Providers"

## 2020-02-09 ENCOUNTER — Other Ambulatory Visit: Payer: Self-pay

## 2020-02-09 ENCOUNTER — Encounter: Payer: Self-pay | Admitting: Rehabilitative and Restorative Service Providers"

## 2020-02-09 DIAGNOSIS — R600 Localized edema: Secondary | ICD-10-CM

## 2020-02-09 DIAGNOSIS — M25511 Pain in right shoulder: Secondary | ICD-10-CM

## 2020-02-09 DIAGNOSIS — M6281 Muscle weakness (generalized): Secondary | ICD-10-CM

## 2020-02-09 DIAGNOSIS — M25611 Stiffness of right shoulder, not elsewhere classified: Secondary | ICD-10-CM

## 2020-02-09 NOTE — Therapy (Signed)
[place], [place], [address] Phone: [phone]   Fax:  [phone]  Physical Therapy Treatment  Patient Details  Name: Darlene Ochoa MRN: [id number] Date of Birth: January 11, 1966 Referring Provider (PT): Ophelia Charter, MD   Encounter Date: 02/09/2020   PT End of Session - 02/09/20 0844    Visit Number 25    Number of Visits 32    Date for PT Re-Evaluation 03/20/20    Authorization Type BCBS    PT Start Time 0845    PT Stop Time 0933    PT Time Calculation (min) 48 min    Activity Tolerance Patient tolerated treatment well           Past Medical History:  Diagnosis Date  . Anxiety   . Chest pain 03/31/2012  . Depression   . H/O prolonged Q-T interval on ECG 03/31/2012  . Hypertension   . Major depressive disorder 03/31/2012  . Seasonal allergies     Past Surgical History:  Procedure Laterality Date  . CESAREAN SECTION  1998  . ROTATOR CUFF REPAIR Right 2021    There were no vitals filed for this visit.   Subjective Assessment - 02/09/20 0847    Subjective Patient reports that she is continuing to work on shoulder ROM and strength. Can tell she is gradually progressing.    Currently in Pain? Yes    Pain Score 1     Pain Location Shoulder    Pain Orientation Right    Pain Descriptors / Indicators Sore    Pain Type Surgical pain;Chronic pain                             [facility] Adult PT Treatment/Exercise - 02/09/20 0001      Therapeutic Activites    Therapeutic Activities Other Therapeutic Activities    Other Therapeutic Activities ball release work anterior shoulder       Shoulder Exercises: Pulleys   Flexion 3 minutes    Scaption 3 minutes      Shoulder Exercises: Stretch   Internal Rotation Stretch 3 reps   20-30 sec hold    Internal Rotation Stretch Limitations ball anterior shoulder to prevent forward shoulder posture     Other Shoulder Stretches 3 way  doorway stretch 20-30 sec x 3 reps each position - to pt tolerance       Shoulder Exercises: Body Blade   Flexion 60 seconds;1 rep    Flexion Limitations pain in the thumb       Moist Heat Therapy   Number Minutes Moist Heat 10 Minutes    Moist Heat Location Shoulder      Manual Therapy   Manual therapy comments skilled palpation to assess response to DN/manual work     Joint Mobilization [place] jt mobs Rt grade II/III to increase ROM/mobility    Soft tissue mobilization STM to Rt pec, lat, teres major, subscap, upper and mid trap, levator, rhomboid     Myofascial Release anterior chest to anterior shoulder/arm, Lat     Scapular Mobilization Rt     Passive ROM to right shoulder flex/ABD,ER/IR in scapular plane             Trigger Point Dry Needling - 02/09/20 0001    Consent Given? Yes    Education Handout Provided Previously provided    Dry Needling Comments Rt     Pectoralis Major Response Palpable increased  muscle length;Twitch response elicited    Pectoralis Minor Response Palpable increased muscle length;Twitch response elicited    Deltoid Response Palpable increased muscle length   anterior deltoid    Biceps Response Palpable increased muscle length                  PT Short Term Goals - 01/03/20 0855      PT SHORT TERM GOAL #1   Title Ind with initial HEP    Status Achieved      PT SHORT TERM GOAL #2   Title PROM to protocol goals by 6 weeks (11/28/19)    Status Achieved      PT SHORT TERM GOAL #3   Title Patient ind in [place] for right shoulder    Baseline Pt reporting increased compliance with stretches    Status Partially Met      PT SHORT TERM GOAL #4   Title Pt to demo full AROM of right elbow    Status Achieved             PT Long Term Goals - 02/07/20 1019      PT LONG TERM GOAL #1   Title Improve scapular control with patient to demonstrate elevation of Rt shoulder flexion and scaption to 90-100 deg with minimal upward movement of  scapula    Time 6    Period Weeks    Status Revised    Target Date 03/20/20      PT LONG TERM GOAL #2   Title Increase AROM Rt shoulder to WFL's throughout    Time 6    Period Weeks    Status Revised    Target Date 03/20/20      PT LONG TERM GOAL #3   Title 4/5 to 4+/5 strength Rt UE    Time 6    Period Weeks    Status Revised    Target Date 03/20/20      PT LONG TERM GOAL #4   Title Independent in HEP    Time 6    Period Weeks    Status Revised    Target Date 03/20/20      PT LONG TERM GOAL #5   Title Improve FOTO to </= 37% limitation    Time 6    Period Weeks    Status Revised    Target Date 03/20/20                 Plan - 02/09/20 0849    Clinical Impression Statement Continued gradual progress with Rt shoulder ROM/mobility. She has end range tightness. Continues to demonstrate positive response to DN/manual work and passive sretching by PT.    Rehab Potential Good    PT Frequency 2x / week    PT Duration 6 weeks    PT Treatment/Interventions ADLs/Self Care Home Management;Cryotherapy;Electrical Stimulation;Iontophoresis 58m/ml Dexamethasone;Moist Heat;Therapeutic exercise;Therapeutic activities;Neuromuscular re-education;Patient/family education;Manual techniques;Taping;Dry needling;Passive range of motion;Vasopneumatic Device;Joint Manipulations;Spinal Manipulations    PT Next Visit Plan continue with shoulder rehab addressing ROM; scapular control and rhythm; strength and function; assess response to DN hands; continue DN to hands/add shoulder as indicated.    PT Home Exercise Plan [email or web address]           Patient will benefit from skilled therapeutic intervention in order to improve the following deficits and impairments:     Visit Diagnosis: Stiffness of right shoulder, not elsewhere classified  Acute pain of right shoulder  Muscle weakness (generalized)  Localized edema  Problem List Patient Active Problem List   Diagnosis Date Noted  .  Impingement syndrome of right shoulder 03/28/2019  . Primary osteoarthritis of both first carpometacarpal joints 07/29/2018  . Tendonitis of both wrists 06/01/2018  . Numbness and tingling in both hands 06/01/2018  . Eustachian tube dysfunction, bilateral 08/17/2017  . Angio-edema 08/10/2017  . Environmental allergies 07/21/2017  . Seasonal allergies 07/21/2017  . Seasonal allergic rhinitis 07/09/2017  . Post-nasal drip 07/09/2017  . 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 Trapezius muscle spasm 08/21/2016  . Vitamin D deficiency 08/01/2014  . B12 deficiency 08/01/2014  . Prolonged Q-T interval on ECG 10/06/2013  . Major depressive disorder, recurrent episode, moderate ([place]) 12/27/2012  . Generalized anxiety disorder 12/27/2012  . Anxiety 04/27/2012  . Essential hypertension 04/27/2012  . Major depressive disorder 03/31/2012  . Chest pain 03/31/2012  . Shortness of breath 03/31/2012  . Migraine 03/31/2012  . PALPITATIONS 08/02/2009    Xaiden Fleig Nilda Simmer PT, MPH  02/09/2020, 11:28 AM  [facility] [place], [place], [address] Phone: [phone]   Fax:  [phone]  Name: Darlene Ochoa MRN: [id number] Date of Birth: Oct 07, 1965

## 2020-02-14 ENCOUNTER — Other Ambulatory Visit: Payer: Self-pay

## 2020-02-14 ENCOUNTER — Ambulatory Visit: Payer: BC Managed Care – PPO | Admitting: Rehabilitative and Restorative Service Providers"

## 2020-02-14 ENCOUNTER — Encounter: Payer: Self-pay | Admitting: Rehabilitative and Restorative Service Providers"

## 2020-02-14 DIAGNOSIS — M6281 Muscle weakness (generalized): Secondary | ICD-10-CM | POA: Diagnosis not present

## 2020-02-14 DIAGNOSIS — M25541 Pain in joints of right hand: Secondary | ICD-10-CM

## 2020-02-14 DIAGNOSIS — M25611 Stiffness of right shoulder, not elsewhere classified: Secondary | ICD-10-CM

## 2020-02-14 DIAGNOSIS — M25511 Pain in right shoulder: Secondary | ICD-10-CM | POA: Diagnosis not present

## 2020-02-14 DIAGNOSIS — M79642 Pain in left hand: Secondary | ICD-10-CM

## 2020-02-14 DIAGNOSIS — R600 Localized edema: Secondary | ICD-10-CM

## 2020-02-14 NOTE — Therapy (Signed)
[place], [place], [address] Phone: [phone]   Fax:  [phone]  Physical Therapy Treatment  Patient Details  Name: Darlene Ochoa MRN: [id number] Date of Birth: 07/30/1965 Referring Provider (PT): Ophelia Charter, MD   Encounter Date: 02/14/2020   PT End of Session - 02/14/20 0855    Visit Number 26    Number of Visits 32    Date for PT Re-Evaluation 03/20/20    Authorization Type BCBS    PT Start Time 0845    PT Stop Time 0934    PT Time Calculation (min) 49 min    Activity Tolerance Patient tolerated treatment well           Past Medical History:  Diagnosis Date   Anxiety    Chest pain 03/31/2012   Depression    H/O prolonged Q-T interval on ECG 03/31/2012   Hypertension    Major depressive disorder 03/31/2012   Seasonal allergies     Past Surgical History:  Procedure Laterality Date   [place] Right 2021    There were no vitals filed for this visit.   Subjective Assessment - 02/14/20 0856    Subjective Workingon exercises at home. Continues to improve with less pain and improving movement    Currently in Pain? No/denies    Pain Score 0-No pain    Pain Location Shoulder    Pain Orientation Right                             [facility] Adult PT Treatment/Exercise - 02/14/20 0001      Shoulder Exercises: Therapy Ball   Flexion Both    Flexion Limitations rolling ball out on table to stretch into flexion 10 sec hold x 15 reps       Shoulder Exercises: Stretch   Internal Rotation Stretch 3 reps   20-30 sec hold    Other Shoulder Stretches 3 way doorway stretch 20-30 sec x 3 reps each position - to pt tolerance     Other Shoulder Stretches lat stretch supine 30 sec x 3 reps       Moist Heat Therapy   Number Minutes Moist Heat 10 Minutes    Moist Heat Location Shoulder      Manual Therapy   Manual therapy comments  skilled palpation to assess response to DN/manual work     Joint Mobilization [place] jt mobs Rt grade II/III to increase ROM/mobility    Soft tissue mobilization STM to Rt pec, lat, teres major, subscap, upper and mid trap, levator, rhomboid     Myofascial Release anterior chest to anterior shoulder/arm, Lat     Scapular Mobilization Rt     Passive ROM to right shoulder flex/ABD,ER/IR in scapular plane             Trigger Point Dry Needling - 02/14/20 0001    Consent Given? Yes    Education Handout Provided Previously provided    Dry Needling Comments Rt     Pectoralis Major Response Palpable increased muscle length;Twitch response elicited    Pectoralis Minor Response Palpable increased muscle length;Twitch response elicited    Deltoid Response Palpable increased muscle length   anterior deltoid    Teres major Response Palpable increased muscle length    Teres minor Response Palpable increased muscle length    Biceps Response Palpable increased muscle length  PT Short Term Goals - 01/03/20 0855      PT SHORT TERM GOAL #1   Title Ind with initial HEP    Status Achieved      PT SHORT TERM GOAL #2   Title PROM to protocol goals by 6 weeks (11/28/19)    Status Achieved      PT SHORT TERM GOAL #3   Title Patient ind in [place] for right shoulder    Baseline Pt reporting increased compliance with stretches    Status Partially Met      PT SHORT TERM GOAL #4   Title Pt to demo full AROM of right elbow    Status Achieved             PT Long Term Goals - 02/07/20 1019      PT LONG TERM GOAL #1   Title Improve scapular control with patient to demonstrate elevation of Rt shoulder flexion and scaption to 90-100 deg with minimal upward movement of scapula    Time 6    Period Weeks    Status Revised    Target Date 03/20/20      PT LONG TERM GOAL #2   Title Increase AROM Rt shoulder to WFL's throughout    Time 6    Period Weeks    Status Revised     Target Date 03/20/20      PT LONG TERM GOAL #3   Title 4/5 to 4+/5 strength Rt UE    Time 6    Period Weeks    Status Revised    Target Date 03/20/20      PT LONG TERM GOAL #4   Title Independent in HEP    Time 6    Period Weeks    Status Revised    Target Date 03/20/20      PT LONG TERM GOAL #5   Title Improve FOTO to </= 37% limitation    Time 6    Period Weeks    Status Revised    Target Date 03/20/20                 Plan - 02/14/20 0858    Clinical Impression Statement Decreased pain and discomfort; increased shoulder ROM/mobility; improving scapular control with decreased scapular dyskinesis. Good response to manual work and passive stretching and DN/manual work.    Rehab Potential Good    PT Frequency 2x / week    PT Duration 6 weeks    PT Treatment/Interventions ADLs/Self Care Home Management;Cryotherapy;Electrical Stimulation;Iontophoresis 3m/ml Dexamethasone;Moist Heat;Therapeutic exercise;Therapeutic activities;Neuromuscular re-education;Patient/family education;Manual techniques;Taping;Dry needling;Passive range of motion;Vasopneumatic Device;Joint Manipulations;Spinal Manipulations    PT Next Visit Plan continue with shoulder rehab addressing ROM; scapular control and rhythm; strength and function; assess response to DN hands; continue DN to hands/add shoulder as indicated.    PT Home Exercise Plan [email or web address]    Consulted and Agree with Plan of Care Patient           Patient will benefit from skilled therapeutic intervention in order to improve the following deficits and impairments:     Visit Diagnosis: Stiffness of right shoulder, not elsewhere classified  Acute pain of right shoulder  Muscle weakness (generalized)  Localized edema  Pain in joints of right hand  Pain in left hand     Problem List Patient Active Problem List   Diagnosis Date Noted   Impingement syndrome of right shoulder 03/28/2019   Primary osteoarthritis of both  first carpometacarpal joints 07/29/2018  Tendonitis of both wrists 06/01/2018   Numbness and tingling in both hands 06/01/2018   Eustachian tube dysfunction, bilateral 08/17/2017   Angio-edema 08/10/2017   Environmental allergies 07/21/2017   Seasonal allergies 07/21/2017   Seasonal allergic rhinitis 07/09/2017   Post-nasal drip 07/09/2017   Submental mass 12/07/2016   Trapezius muscle spasm 08/21/2016   Vitamin D deficiency 08/01/2014   B12 deficiency 08/01/2014   Prolonged Q-T interval on ECG 10/06/2013   Major depressive disorder, recurrent episode, moderate (HCC) 12/27/2012   Generalized anxiety disorder 12/27/2012   Anxiety 04/27/2012   Essential hypertension 04/27/2012   Major depressive disorder 03/31/2012   Chest pain 03/31/2012   Shortness of breath 03/31/2012   Migraine 03/31/2012   PALPITATIONS 08/02/2009    Anwyn Kriegel Nilda Simmer PT, MPH  02/14/2020, 9:36 AM  [facility] [place], [place], [address] Phone: [phone]   Fax:  [phone]  Name: Darlene Ochoa MRN: [id number] Date of Birth: 11-24-1965

## 2020-02-16 ENCOUNTER — Encounter: Payer: Self-pay | Admitting: Rehabilitative and Restorative Service Providers"

## 2020-02-16 ENCOUNTER — Other Ambulatory Visit: Payer: Self-pay

## 2020-02-16 ENCOUNTER — Ambulatory Visit: Payer: BC Managed Care – PPO | Admitting: Rehabilitative and Restorative Service Providers"

## 2020-02-16 DIAGNOSIS — M79642 Pain in left hand: Secondary | ICD-10-CM

## 2020-02-16 DIAGNOSIS — M6281 Muscle weakness (generalized): Secondary | ICD-10-CM | POA: Diagnosis not present

## 2020-02-16 DIAGNOSIS — R600 Localized edema: Secondary | ICD-10-CM | POA: Diagnosis not present

## 2020-02-16 DIAGNOSIS — M25511 Pain in right shoulder: Secondary | ICD-10-CM | POA: Diagnosis not present

## 2020-02-16 DIAGNOSIS — M25541 Pain in joints of right hand: Secondary | ICD-10-CM

## 2020-02-16 DIAGNOSIS — M25611 Stiffness of right shoulder, not elsewhere classified: Secondary | ICD-10-CM

## 2020-02-16 NOTE — Therapy (Signed)
[place], [place], [address] Phone: [phone]   Fax:  [phone]  Physical Therapy Treatment  Patient Details  Name: Darlene Ochoa MRN: [id number] Date of Birth: 1965-11-29 Referring Provider (PT): Ophelia Charter, MD   Encounter Date: 02/16/2020   PT End of Session - 02/16/20 1020    Visit Number 27    Number of Visits 32    Date for PT Re-Evaluation 03/20/20    Authorization Type BCBS    PT Start Time 1016    PT Stop Time 1105    PT Time Calculation (min) 49 min    Activity Tolerance Patient tolerated treatment well           Past Medical History:  Diagnosis Date   Anxiety    Chest pain 03/31/2012   Depression    H/O prolonged Q-T interval on ECG 03/31/2012   Hypertension    Major depressive disorder 03/31/2012   Seasonal allergies     Past Surgical History:  Procedure Laterality Date   [place] Right 2021    There were no vitals filed for this visit.   Subjective Assessment - 02/16/20 1021    Subjective Sore from DN but feels better. Still has some tightness under the Rt arm but overall improving. Thumbs hurt.    Currently in Pain? No/denies    Pain Score 0-No pain                             [facility] Adult PT Treatment/Exercise - 02/16/20 0001      Shoulder Exercises: Supine   Other Supine Exercises chest lift 10 sec x 10       Shoulder Exercises: Therapy Ball   Flexion Both    Flexion Limitations rolling ball out on table to stretch into flexion 10 sec hold x 15 reps       Shoulder Exercises: ROM/Strengthening   UBE (Upper Arm Bike) L2 x 3 min fwd/back - some thumb pain       Shoulder Exercises: Stretch   Other Shoulder Stretches 3 way doorway stretch 20-30 sec x 3 reps each position - to pt tolerance       Moist Heat Therapy   Number Minutes Moist Heat 10 Minutes    Moist Heat Location Shoulder       Manual Therapy   Manual therapy comments skilled palpation to assess response to DN/manual work     Joint Mobilization [place] jt mobs Rt grade II/III to increase ROM/mobility    Soft tissue mobilization STM to Rt pec, lat, teres major, subscap, upper and mid trap, levator, rhomboid     Myofascial Release anterior chest to anterior shoulder/arm, Lat     Scapular Mobilization Rt     Passive ROM to right shoulder flex/ABD,ER/IR in scapular plane       Kinesiotix   Facilitate Muscle  Rt shoulder to unload the joint; improve activation of deltoid                     PT Short Term Goals - 01/03/20 0855      PT SHORT TERM GOAL #1   Title Ind with initial HEP    Status Achieved      PT SHORT TERM GOAL #2   Title PROM to protocol goals by 6 weeks (11/28/19)    Status Achieved  PT SHORT TERM GOAL #3   Title Patient ind in [place] for right shoulder    Baseline Pt reporting increased compliance with stretches    Status Partially Met      PT SHORT TERM GOAL #4   Title Pt to demo full AROM of right elbow    Status Achieved             PT Long Term Goals - 02/07/20 1019      PT LONG TERM GOAL #1   Title Improve scapular control with patient to demonstrate elevation of Rt shoulder flexion and scaption to 90-100 deg with minimal upward movement of scapula    Time 6    Period Weeks    Status Revised    Target Date 03/20/20      PT LONG TERM GOAL #2   Title Increase AROM Rt shoulder to WFL's throughout    Time 6    Period Weeks    Status Revised    Target Date 03/20/20      PT LONG TERM GOAL #3   Title 4/5 to 4+/5 strength Rt UE    Time 6    Period Weeks    Status Revised    Target Date 03/20/20      PT LONG TERM GOAL #4   Title Independent in HEP    Time 6    Period Weeks    Status Revised    Target Date 03/20/20      PT LONG TERM GOAL #5   Title Improve FOTO to </= 37% limitation    Time 6    Period Weeks    Status Revised    Target Date 03/20/20                  Plan - 02/16/20 1026    Clinical Impression Statement Good response to Dn and manual work. Patient has continued tightness through the Lt shoulder girdle. Muscular tightness through the deltoid related to anterior position of the humerus in the glenoid fossa and forward shoulder posture. Trial of kinesotape to support deltoid and unload the joint. DN to teres and lats in axilla area. Continued work on posterior shoulder girdle strength and improving shoulder mechanics and scapular control.    Rehab Potential Good    PT Frequency 2x / week    PT Duration 6 weeks    PT Treatment/Interventions ADLs/Self Care Home Management;Cryotherapy;Electrical Stimulation;Iontophoresis 46m/ml Dexamethasone;Moist Heat;Therapeutic exercise;Therapeutic activities;Neuromuscular re-education;Patient/family education;Manual techniques;Taping;Dry needling;Passive range of motion;Vasopneumatic Device;Joint Manipulations;Spinal Manipulations    PT Next Visit Plan continue with shoulder rehab addressing ROM; scapular control and rhythm; strength and function; continue DN to hands/add shoulder as indicated; assess response to taping to support deltoid/shoulder; posterior shoulder girdle strengthening    PT Home Exercise Plan [email or web address]    Consulted and Agree with Plan of Care Patient           Patient will benefit from skilled therapeutic intervention in order to improve the following deficits and impairments:     Visit Diagnosis: Stiffness of right shoulder, not elsewhere classified  Acute pain of right shoulder  Muscle weakness (generalized)  Localized edema  Pain in joints of right hand  Pain in left hand     Problem List Patient Active Problem List   Diagnosis Date Noted   Impingement syndrome of right shoulder 03/28/2019   Primary osteoarthritis of both first carpometacarpal joints 07/29/2018   Tendonitis of both wrists 06/01/2018   Numbness and tingling in  both hands  06/01/2018   Eustachian tube dysfunction, bilateral 08/17/2017   Angio-edema 08/10/2017   Environmental allergies 07/21/2017   Seasonal allergies 07/21/2017   Seasonal allergic rhinitis 07/09/2017   Post-nasal drip 07/09/2017   Submental mass 12/07/2016   Trapezius muscle spasm 08/21/2016   Vitamin D deficiency 08/01/2014   B12 deficiency 08/01/2014   Prolonged Q-T interval on ECG 10/06/2013   Major depressive disorder, recurrent episode, moderate (HCC) 12/27/2012   Generalized anxiety disorder 12/27/2012   Anxiety 04/27/2012   Essential hypertension 04/27/2012   Major depressive disorder 03/31/2012   Chest pain 03/31/2012   Shortness of breath 03/31/2012   Migraine 03/31/2012   PALPITATIONS 08/02/2009    Tanikka Bresnan Nilda Simmer  PT, MPH  02/16/2020, 11:05 AM  [facility] [place], [place], [address] Phone: [phone]   Fax:  [phone]  Name: Graceyn Fodor MRN: [id number] Date of Birth: 1965/12/04

## 2020-02-21 DIAGNOSIS — M24111 Other articular cartilage disorders, right shoulder: Secondary | ICD-10-CM | POA: Diagnosis not present

## 2020-02-28 ENCOUNTER — Ambulatory Visit (INDEPENDENT_AMBULATORY_CARE_PROVIDER_SITE_OTHER): Payer: BC Managed Care – PPO | Admitting: Rehabilitative and Restorative Service Providers"

## 2020-02-28 ENCOUNTER — Encounter: Payer: Self-pay | Admitting: Rehabilitative and Restorative Service Providers"

## 2020-02-28 ENCOUNTER — Other Ambulatory Visit: Payer: Self-pay

## 2020-02-28 DIAGNOSIS — M6281 Muscle weakness (generalized): Secondary | ICD-10-CM

## 2020-02-28 DIAGNOSIS — M25511 Pain in right shoulder: Secondary | ICD-10-CM | POA: Diagnosis not present

## 2020-02-28 DIAGNOSIS — M79642 Pain in left hand: Secondary | ICD-10-CM

## 2020-02-28 DIAGNOSIS — F411 Generalized anxiety disorder: Secondary | ICD-10-CM | POA: Diagnosis not present

## 2020-02-28 DIAGNOSIS — M25541 Pain in joints of right hand: Secondary | ICD-10-CM

## 2020-02-28 DIAGNOSIS — M25611 Stiffness of right shoulder, not elsewhere classified: Secondary | ICD-10-CM

## 2020-02-28 DIAGNOSIS — R600 Localized edema: Secondary | ICD-10-CM | POA: Diagnosis not present

## 2020-02-28 NOTE — Therapy (Signed)
[place], [place], [address] Phone: [phone]   Fax:  [phone]  Physical Therapy Treatment  Patient Details  Name: Darlene Ochoa MRN: [id number] Date of Birth: [birth year] Referring Provider (PT): Ophelia Charter, MD   Encounter Date: 02/28/2020   PT End of Session - 02/28/20 0940    Visit Number 28    Number of Visits 32    Date for PT Re-Evaluation 03/20/20    Authorization Type BCBS    PT Start Time 0935    PT Stop Time 1023    PT Time Calculation (min) 48 min    Activity Tolerance Patient tolerated treatment well           Past Medical History:  Diagnosis Date  . Anxiety   . Chest pain 03/31/2012  . Depression   . H/O prolonged Q-T interval on ECG 03/31/2012  . Hypertension   . Major depressive disorder 03/31/2012  . Seasonal allergies     Past Surgical History:  Procedure Laterality Date  . CESAREAN SECTION  1998  . ROTATOR CUFF REPAIR Right 2021    There were no vitals filed for this visit.   Subjective Assessment - 02/28/20 0940    Subjective Thumbs are terrible. Shoulder feels good. Can tell she needs to work more. DN helps with tightness in the muscles.     Currently in Pain? No/denies    Pain Score 0-No pain    Pain Location Shoulder    Pain Orientation Right              [facility] PT Assessment - 02/28/20 0001      Assessment   Medical Diagnosis Rt RCR/DCE/SAD    Referring Provider (PT) Ophelia Charter, MD    Onset Date/Surgical Date 10/17/19    Hand Dominance Right      PROM   Right/Left Shoulder --   standing   Right Shoulder Extension 75 Degrees    Right Shoulder Flexion 158 Degrees    Right Shoulder ABduction 155 Degrees   scapular plane   Right Shoulder Internal Rotation 76 Degrees    Right Shoulder External Rotation 78 Degrees   in scapular plane      Flexibility   Soft Tissue Assessment /Muscle Length --   capsular tightness noted Rt shoulder       Palpation   Palpation comment muscular tightness Rt upper quarter - pecs; upper trap; leveator; teres; biceps; triceps                          [facility] Adult PT Treatment/Exercise - 02/28/20 0001      Shoulder Exercises: Seated   Other Seated Exercises initiation of the deltoid in sitting 10 reps x 2 sets       Shoulder Exercises: Standing   Other Standing Exercises ball on wall dorsum of hand working on small circles       Shoulder Exercises: Pulleys   Flexion 2 minutes    Scaption 2 minutes      Shoulder Exercises: Stretch   Other Shoulder Stretches 3 way doorway stretch 20-30 sec x 3 reps each position - to pt tolerance       Moist Heat Therapy   Number Minutes Moist Heat 10 Minutes    Moist Heat Location Shoulder      Manual Therapy   Manual therapy comments skilled palpation to assess response to DN/manual work     Joint  Mobilization GH jt mobs Rt grade II/III to increase ROM/mobility    Soft tissue mobilization STM to Rt pec, lat, teres major, subscap, upper and mid trap, levator, rhomboid     Myofascial Release anterior chest to anterior shoulder/arm, Lat     Scapular Mobilization Rt     Passive ROM to right shoulder flex/ABD,ER/IR in scapular plane       Kinesiotix   Facilitate Muscle  Rt shoulder to unload the joint; improve activation of deltoid             Trigger Point Dry Needling - 02/28/20 0001    Consent Given? Yes    Education Handout Provided Previously provided    Dry Needling Comments Rt     Pectoralis Major Response Palpable increased muscle length;Twitch response elicited    Pectoralis Minor Response Palpable increased muscle length;Twitch response elicited    Teres major Response Palpable increased muscle length    Teres minor Response Palpable increased muscle length                PT Education - 02/28/20 1002    Education Details HEP    Person(s) Educated Patient    Methods Explanation;Demonstration;Tactile cues;Verbal  cues;Handout    Comprehension Verbalized understanding;Returned demonstration;Verbal cues required;Tactile cues required            PT Short Term Goals - 01/03/20 0855      PT SHORT TERM GOAL #1   Title Ind with initial HEP    Status Achieved      PT SHORT TERM GOAL #2   Title PROM to protocol goals by 6 weeks (11/28/19)    Status Achieved      PT SHORT TERM GOAL #3   Title Patient ind in [place] for right shoulder    Baseline Pt reporting increased compliance with stretches    Status Partially Met      PT SHORT TERM GOAL #4   Title Pt to demo full AROM of right elbow    Status Achieved             PT Long Term Goals - 02/07/20 1019      PT LONG TERM GOAL #1   Title Improve scapular control with patient to demonstrate elevation of Rt shoulder flexion and scaption to 90-100 deg with minimal upward movement of scapula    Time 6    Period Weeks    Status Revised    Target Date 03/20/20      PT LONG TERM GOAL #2   Title Increase AROM Rt shoulder to WFL's throughout    Time 6    Period Weeks    Status Revised    Target Date 03/20/20      PT LONG TERM GOAL #3   Title 4/5 to 4+/5 strength Rt UE    Time 6    Period Weeks    Status Revised    Target Date 03/20/20      PT LONG TERM GOAL #4   Title Independent in HEP    Time 6    Period Weeks    Status Revised    Target Date 03/20/20      PT LONG TERM GOAL #5   Title Improve FOTO to </= 37% limitation    Time 6    Period Weeks    Status Revised    Target Date 03/20/20                 Plan -  02/28/20 0943    Clinical Impression Statement Continued improvement in mobility and ROM. Working on stretching and strengthening. Continued tightness through the shoulder girdle. Taping was really good. Helped with support of the shoulder. Added iniitation of the deltoid exercise in sitting    Rehab Potential Good    PT Frequency 2x / week    PT Duration 6 weeks    PT Treatment/Interventions ADLs/Self Care  Home Management;Cryotherapy;Electrical Stimulation;Iontophoresis 57m/ml Dexamethasone;Moist Heat;Therapeutic exercise;Therapeutic activities;Neuromuscular re-education;Patient/family education;Manual techniques;Taping;Dry needling;Passive range of motion;Vasopneumatic Device;Joint Manipulations;Spinal Manipulations    PT Next Visit Plan continue with shoulder rehab addressing ROM; scapular control and rhythm; strength and function; continue DN to hands/add shoulder as indicated; continue taping to support deltoid/shoulder; posterior shoulder girdle strengthening - trial of ER in seated position forearm propped    PT Home Exercise Plan [email or web address]    Consulted and Agree with Plan of Care Patient           Patient will benefit from skilled therapeutic intervention in order to improve the following deficits and impairments:     Visit Diagnosis: Stiffness of right shoulder, not elsewhere classified  Acute pain of right shoulder  Muscle weakness (generalized)  Localized edema  Pain in joints of right hand  Pain in left hand     Problem List Patient Active Problem List   Diagnosis Date Noted  . Impingement syndrome of right shoulder 03/28/2019  . Primary osteoarthritis of both first carpometacarpal joints 07/29/2018  . Tendonitis of both wrists 06/01/2018  . Numbness and tingling in both hands 06/01/2018  . Eustachian tube dysfunction, bilateral 08/17/2017  . Angio-edema 08/10/2017  . Environmental allergies 07/21/2017  . Seasonal allergies 07/21/2017  . Seasonal allergic rhinitis 07/09/2017  . Post-nasal drip 07/09/2017  . 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 Trapezius muscle spasm 08/21/2016  . Vitamin D deficiency 08/01/2014  . B12 deficiency 08/01/2014  . Prolonged Q-T interval on ECG 10/06/2013  . Major depressive disorder, recurrent episode, moderate ([place] 12/27/2012  . Generalized anxiety disorder 12/27/2012  . Anxiety 04/27/2012  . Essential hypertension 04/27/2012  . Major  depressive disorder 03/31/2012  . Chest pain 03/31/2012  . Shortness of breath 03/31/2012  . Migraine 03/31/2012  . PALPITATIONS 08/02/2009    Hason Ofarrell PNilda SimmerPT, MPH  02/28/2020, 12:56 PM  [facility] [address]Phone: [phone]  Fax:  [phone] Name: SNyema HacheyMRN: [id number] of Birth: [birth year]

## 2020-02-28 NOTE — Patient Instructions (Signed)
Access Code: [email or web address]: [email or web address]: 11/09/2021Prepared by: Marcques Wrightsman HoltExercises  Seated Finger Composite Flexion Extension - 2 x daily - 7 x weekly - 3 sets - 15 reps  Wrist AROM Flexion Extension - 2 x daily - 7 x weekly - 3 sets - 15 reps  Seated Elbow Flexion Extension AROM - 2 x daily - 7 x weekly - 3 sets - 15 reps  Circular Shoulder Pendulum with Table Support - 2 x daily - 7 x weekly - 3 sets - 15 reps  Flexion-Extension Shoulder Pendulum with Table Support - 2 x daily - 7 x weekly - 3 sets - 15 reps  Horizontal Shoulder Pendulum with Table Support - 2 x daily - 7 x weekly - 3 sets - 15 reps  Seated Upper Trapezius Stretch - 2 x daily - 7 x weekly - 1 sets - 3 reps - 30 sec hold  Seated Scapular Retraction - 2 x daily - 7 x weekly - 1-3 sets - 10 reps  Seated Shoulder External Rotation AAROM with Cane and Hand in Neutral - 3-4 x daily - 7 x weekly - 1 sets - 5-10 reps - 10 sec hold  Seated Shoulder Flexion Towel Slide at Table Top - 3-4 x daily - 7 x weekly - 1 sets - 5-10 reps - 10 sec hold  Supine Shoulder Press with Dowel - 2 x daily - 7 x weekly - 1 sets - 10 reps  Supine Shoulder Flexion with Dowel - 2 x daily - 7 x weekly - 1 sets - 10 reps  Supine Shoulder External Rotation in 45 Degrees Abduction AAROM with Dowel - 2 x daily - 7 x weekly - 1 sets - 10 reps - 5-10 hold  Seated Shoulder Circles AAROM with Dowel into Wall - 2 x daily - 7 x weekly - 1 sets - 10 reps  Standing Bilateral Shoulder Internal Rotation AAROM with Dowel - 2 x daily - 7 x weekly - 1 sets - 10 reps  Standing Shoulder Extension with Dowel - 2 x daily - 7 x weekly - 1 sets - 10 reps  Isometric Shoulder Flexion at Wall - 2 x daily - 7 x weekly - 1 sets - 10 reps  Isometric Shoulder Extension at Wall - 2 x daily - 7 x weekly - 1 sets - 10 reps  Standing Isometric Shoulder Internal Rotation at Doorway - 2 x daily - 7 x weekly - 1 sets - 10 reps  Standing Isometric Shoulder External  Rotation with Doorway - 2 x daily - 7 x weekly - 1 sets - 10 reps - 5 hold  Supine Chest Stretch with Arms Behind Head - 2 x daily - 7 x weekly - 1 sets - 3 reps - 30 hold  Doorway Pec Stretch at 60 Degrees Abduction - 3 x daily - 7 x weekly - 3 reps - 1 sets  Doorway Pec Stretch at 90 Degrees Abduction - 3 x daily - 7 x weekly - 3 reps - 1 sets - 30 seconds hold  Doorway Pec Stretch at 120 Degrees Abduction - 3 x daily - 7 x weekly - 3 reps - 1 sets - 30 second hold hold  Isometric Shoulder External Rotation in Abduction with Ball at Wall - 2 x daily - 7 x weekly - 1 sets - 5-10 reps - 30-60 sec hold

## 2020-03-01 ENCOUNTER — Other Ambulatory Visit: Payer: Self-pay

## 2020-03-01 ENCOUNTER — Encounter: Payer: Self-pay | Admitting: Rehabilitative and Restorative Service Providers"

## 2020-03-01 ENCOUNTER — Ambulatory Visit: Payer: BC Managed Care – PPO | Admitting: Rehabilitative and Restorative Service Providers"

## 2020-03-01 DIAGNOSIS — M25511 Pain in right shoulder: Secondary | ICD-10-CM | POA: Diagnosis not present

## 2020-03-01 DIAGNOSIS — R600 Localized edema: Secondary | ICD-10-CM | POA: Diagnosis not present

## 2020-03-01 DIAGNOSIS — M25611 Stiffness of right shoulder, not elsewhere classified: Secondary | ICD-10-CM | POA: Diagnosis not present

## 2020-03-01 DIAGNOSIS — M6281 Muscle weakness (generalized): Secondary | ICD-10-CM | POA: Diagnosis not present

## 2020-03-01 NOTE — Patient Instructions (Signed)
Access Code: [email or web address]: [email or web address]: 11/11/2021Prepared by: Lorrine Killilea HoltExercises  Seated Finger Composite Flexion Extension - 2 x daily - 7 x weekly - 3 sets - 15 reps  Wrist AROM Flexion Extension - 2 x daily - 7 x weekly - 3 sets - 15 reps  Seated Elbow Flexion Extension AROM - 2 x daily - 7 x weekly - 3 sets - 15 reps  Circular Shoulder Pendulum with Table Support - 2 x daily - 7 x weekly - 3 sets - 15 reps  Flexion-Extension Shoulder Pendulum with Table Support - 2 x daily - 7 x weekly - 3 sets - 15 reps  Horizontal Shoulder Pendulum with Table Support - 2 x daily - 7 x weekly - 3 sets - 15 reps  Seated Upper Trapezius Stretch - 2 x daily - 7 x weekly - 1 sets - 3 reps - 30 sec hold  Seated Scapular Retraction - 2 x daily - 7 x weekly - 1-3 sets - 10 reps  Seated Shoulder External Rotation AAROM with Cane and Hand in Neutral - 3-4 x daily - 7 x weekly - 1 sets - 5-10 reps - 10 sec hold  Seated Shoulder Flexion Towel Slide at Table Top - 3-4 x daily - 7 x weekly - 1 sets - 5-10 reps - 10 sec hold  Supine Shoulder Press with Dowel - 2 x daily - 7 x weekly - 1 sets - 10 reps  Supine Shoulder Flexion with Dowel - 2 x daily - 7 x weekly - 1 sets - 10 reps  Supine Shoulder External Rotation in 45 Degrees Abduction AAROM with Dowel - 2 x daily - 7 x weekly - 1 sets - 10 reps - 5-10 hold  Seated Shoulder Circles AAROM with Dowel into Wall - 2 x daily - 7 x weekly - 1 sets - 10 reps  Standing Bilateral Shoulder Internal Rotation AAROM with Dowel - 2 x daily - 7 x weekly - 1 sets - 10 reps  Standing Shoulder Extension with Dowel - 2 x daily - 7 x weekly - 1 sets - 10 reps  Isometric Shoulder Flexion at Wall - 2 x daily - 7 x weekly - 1 sets - 10 reps  Isometric Shoulder Extension at Wall - 2 x daily - 7 x weekly - 1 sets - 10 reps  Standing Isometric Shoulder Internal Rotation at Doorway - 2 x daily - 7 x weekly - 1 sets - 10 reps  Standing Isometric Shoulder External  Rotation with Doorway - 2 x daily - 7 x weekly - 1 sets - 10 reps - 5 hold  Supine Chest Stretch with Arms Behind Head - 2 x daily - 7 x weekly - 1 sets - 3 reps - 30 hold  Doorway Pec Stretch at 60 Degrees Abduction - 3 x daily - 7 x weekly - 3 reps - 1 sets  Doorway Pec Stretch at 90 Degrees Abduction - 3 x daily - 7 x weekly - 3 reps - 1 sets - 30 seconds hold  Doorway Pec Stretch at 120 Degrees Abduction - 3 x daily - 7 x weekly - 3 reps - 1 sets - 30 second hold hold  Isometric Shoulder External Rotation in Abduction with Ball at Wall - 2 x daily - 7 x weekly - 1 sets - 5-10 reps - 30-60 sec hold  Seated Shoulder External Rotation in Abduction Supported with Resistance - 2 x daily - 7 x weekly - 2  sets - 5-10 reps - 3-5 sec hold

## 2020-03-01 NOTE — Therapy (Signed)
[place], [place], [address] Phone: [phone]   Fax:  [phone]  Physical Therapy Treatment  Patient Details  Name: Darlene Ochoa MRN: [id number] Date of Birth: 13-Aug-1965 Referring Provider (PT): Ophelia Charter, MD   Encounter Date: 03/01/2020   PT End of Session - 03/01/20 1407    Visit Number 29    Number of Visits 32    Date for PT Re-Evaluation 03/20/20    Authorization Type BCBS    PT Start Time 9326    PT Stop Time 1452    PT Time Calculation (min) 49 min    Activity Tolerance Patient tolerated treatment well           Past Medical History:  Diagnosis Date  . Anxiety   . Chest pain 03/31/2012  . Depression   . H/O prolonged Q-T interval on ECG 03/31/2012  . Hypertension   . Major depressive disorder 03/31/2012  . Seasonal allergies     Past Surgical History:  Procedure Laterality Date  . CESAREAN SECTION  1998  . ROTATOR CUFF REPAIR Right 2021    There were no vitals filed for this visit.   Subjective Assessment - 03/01/20 1407    Subjective Patient reports that her shoulder is doing so much better. Can reach behind her now with greater ease. Working on exercises and posture at home.    Currently in Pain? No/denies    Pain Score 0-No pain                             [facility] Adult PT Treatment/Exercise - 03/01/20 0001      Shoulder Exercises: Seated   External Rotation Strengthening;Right;10 reps;Theraband    Theraband Level (Shoulder External Rotation) Level 1 (Yellow)    External Rotation Limitations shoulder 90 deg elbow 90 deg       Shoulder Exercises: Stretch   External Rotation Stretch 3 reps;30 seconds   hands behind head    Other Shoulder Stretches 3 way doorway stretch 20-30 sec x 3 reps each position - to pt tolerance       Moist Heat Therapy   Number Minutes Moist Heat 10 Minutes    Moist Heat Location Shoulder      Manual Therapy    Joint Mobilization GH jt mobs Rt grade II/III to increase ROM/mobility    Soft tissue mobilization STM to Rt pec, lat, teres major, subscap, upper and mid trap, levator, rhomboid     Myofascial Release anterior chest to anterior shoulder/arm, Lat     Scapular Mobilization Rt     Passive ROM to right shoulder flex/ABD,ER/IR in scapular plane       Kinesiotix   Facilitate Muscle  Rt shoulder to unload the joint; improve activation of deltoid                   PT Education - 03/01/20 1423    Education Details HEP    Person(s) Educated Patient    Methods Explanation;Demonstration;Tactile cues;Verbal cues;Handout    Comprehension Verbalized understanding;Returned demonstration;Verbal cues required;Tactile cues required            PT Short Term Goals - 01/03/20 0855      PT SHORT TERM GOAL #1   Title Ind with initial HEP    Status Achieved      PT SHORT TERM GOAL #2   Title PROM to protocol goals by 6  weeks (11/28/19)    Status Achieved      PT SHORT TERM GOAL #3   Title Patient ind in [place] for right shoulder    Baseline Pt reporting increased compliance with stretches    Status Partially Met      PT SHORT TERM GOAL #4   Title Pt to demo full AROM of right elbow    Status Achieved             PT Long Term Goals - 02/07/20 1019      PT LONG TERM GOAL #1   Title Improve scapular control with patient to demonstrate elevation of Rt shoulder flexion and scaption to 90-100 deg with minimal upward movement of scapula    Time 6    Period Weeks    Status Revised    Target Date 03/20/20      PT LONG TERM GOAL #2   Title Increase AROM Rt shoulder to WFL's throughout    Time 6    Period Weeks    Status Revised    Target Date 03/20/20      PT LONG TERM GOAL #3   Title 4/5 to 4+/5 strength Rt UE    Time 6    Period Weeks    Status Revised    Target Date 03/20/20      PT LONG TERM GOAL #4   Title Independent in HEP    Time 6    Period Weeks    Status  Revised    Target Date 03/20/20      PT LONG TERM GOAL #5   Title Improve FOTO to </= 37% limitation    Time 6    Period Weeks    Status Revised    Target Date 03/20/20                 Plan - 03/01/20 1409    Clinical Impression Statement Excellent progress with good response to DN and manual work. Patient has greater ease of movement and improved functional use of Rt UE. Adding exercises. Continue with taping to support deltoid and improve position of humerus in glenoid fossa.    Rehab Potential Good    PT Frequency 2x / week    PT Duration 6 weeks    PT Treatment/Interventions ADLs/Self Care Home Management;Cryotherapy;Electrical Stimulation;Iontophoresis 31m/ml Dexamethasone;Moist Heat;Therapeutic exercise;Therapeutic activities;Neuromuscular re-education;Patient/family education;Manual techniques;Taping;Dry needling;Passive range of motion;Vasopneumatic Device;Joint Manipulations;Spinal Manipulations    PT Next Visit Plan continue with shoulder rehab addressing ROM; scapular control and rhythm; strength and function; continue DN to hands/add shoulder as indicated; continue taping to support deltoid/shoulder; posterior shoulder girdle strengthening    PT Home Exercise Plan [email or web address]    Consulted and Agree with Plan of Care Patient           Patient will benefit from skilled therapeutic intervention in order to improve the following deficits and impairments:     Visit Diagnosis: Stiffness of right shoulder, not elsewhere classified  Acute pain of right shoulder  Muscle weakness (generalized)  Localized edema     Problem List Patient Active Problem List   Diagnosis Date Noted  . Impingement syndrome of right shoulder 03/28/2019  . Primary osteoarthritis of both first carpometacarpal joints 07/29/2018  . Tendonitis of both wrists 06/01/2018  . Numbness and tingling in both hands 06/01/2018  . Eustachian tube dysfunction, bilateral 08/17/2017  . Angio-edema  08/10/2017  . Environmental allergies 07/21/2017  . Seasonal allergies 07/21/2017  . Seasonal allergic rhinitis 07/09/2017  .  Post-nasal drip 07/09/2017  . 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 Trapezius muscle spasm 08/21/2016  . Vitamin D deficiency 08/01/2014  . B12 deficiency 08/01/2014  . Prolonged Q-T interval on ECG 10/06/2013  . Major depressive disorder, recurrent episode, moderate ([place]) 12/27/2012  . Generalized anxiety disorder 12/27/2012  . Anxiety 04/27/2012  . Essential hypertension 04/27/2012  . Major depressive disorder 03/31/2012  . Chest pain 03/31/2012  . Shortness of breath 03/31/2012  . Migraine 03/31/2012  . PALPITATIONS 08/02/2009    Leveta Wahab Nilda Simmer PT, MPH  03/01/2020, 2:47 PM  [facility] [place], [place], [address] Phone: [phone]   Fax:  [phone]  Name: Darlene Ochoa MRN: [id number] Date of Birth: [birth year]

## 2020-03-05 ENCOUNTER — Other Ambulatory Visit: Payer: Self-pay | Admitting: Physician Assistant

## 2020-03-05 DIAGNOSIS — I10 Essential (primary) hypertension: Secondary | ICD-10-CM

## 2020-03-06 ENCOUNTER — Other Ambulatory Visit: Payer: Self-pay

## 2020-03-06 ENCOUNTER — Ambulatory Visit: Payer: BC Managed Care – PPO | Admitting: Rehabilitative and Restorative Service Providers"

## 2020-03-06 ENCOUNTER — Encounter: Payer: Self-pay | Admitting: Rehabilitative and Restorative Service Providers"

## 2020-03-06 DIAGNOSIS — M25511 Pain in right shoulder: Secondary | ICD-10-CM | POA: Diagnosis not present

## 2020-03-06 DIAGNOSIS — M25541 Pain in joints of right hand: Secondary | ICD-10-CM

## 2020-03-06 DIAGNOSIS — M6281 Muscle weakness (generalized): Secondary | ICD-10-CM

## 2020-03-06 DIAGNOSIS — R600 Localized edema: Secondary | ICD-10-CM

## 2020-03-06 DIAGNOSIS — M25611 Stiffness of right shoulder, not elsewhere classified: Secondary | ICD-10-CM

## 2020-03-06 DIAGNOSIS — M79642 Pain in left hand: Secondary | ICD-10-CM

## 2020-03-06 NOTE — Therapy (Signed)
[place], [place], [address] Phone: [phone]   Fax:  [phone]  Physical Therapy Treatment  Patient Details  Name: Darlene Ochoa MRN: [id number] Date of Birth: 1965/06/02 Referring Provider (PT): Ophelia Charter, MD   Encounter Date: 03/06/2020   PT End of Session - 03/06/20 0938    Visit Number 30    Number of Visits 32    Date for PT Re-Evaluation 03/20/20    Authorization Type BCBS    PT Start Time 0932    PT Stop Time 1020    PT Time Calculation (min) 48 min    Activity Tolerance Patient tolerated treatment well           Past Medical History:  Diagnosis Date  . Anxiety   . Chest pain 03/31/2012  . Depression   . H/O prolonged Q-T interval on ECG 03/31/2012  . Hypertension   . Major depressive disorder 03/31/2012  . Seasonal allergies     Past Surgical History:  Procedure Laterality Date  . CESAREAN SECTION  1998  . ROTATOR CUFF REPAIR Right 2021    There were no vitals filed for this visit.   Subjective Assessment - 03/06/20 0940    Subjective Patient reports that her shoulder feels a lot better. She is using the Rt UE for more functional activities. has some soreness in the joint deep and some stiffness with bringing Rt hand up behind back. Thumbs are "killing" her.    Currently in Pain? No/denies    Pain Score 0-No pain    Pain Location Shoulder    Pain Orientation Right    Pain Descriptors / Indicators Sore    Pain Type Surgical pain;Chronic pain                             [facility] Adult PT Treatment/Exercise - 03/06/20 0001      Shoulder Exercises: Standing   External Rotation Limitations ER with scapular squeeze shoulders at ~ 70 deg abd x 10 x 2 sets       Shoulder Exercises: Therapy Ball   Other Therapy Ball Exercises shoulder flexion with ball on table to stretch into shoulder flexion x 10 reps 10 sec hold       Shoulder Exercises: Stretch    Other Shoulder Stretches 3 way doorway stretch 20-30 sec x 3 reps each position - to pt tolerance       Moist Heat Therapy   Number Minutes Moist Heat 10 Minutes    Moist Heat Location Shoulder      Manual Therapy   Manual therapy comments skilled palpation to assess response to DN/manual work     Joint Mobilization [place] jt mobs Rt grade II/III to increase ROM/mobility    Soft tissue mobilization STM to Rt pec, lat, teres major, subscap, upper and mid trap, levator, rhomboid     Myofascial Release anterior chest to anterior shoulder/arm, Lat     Scapular Mobilization Rt     Passive ROM to right shoulder flex/ABD,ER/IR in scapular plane             Trigger Point Dry Needling - 03/06/20 0001    Consent Given? Yes    Education Handout Provided Previously provided    Dry Needling Comments Rt     Teres major Response Palpable increased muscle length    Teres minor Response Palpable increased muscle length  PT Short Term Goals - 01/03/20 0855      PT SHORT TERM GOAL #1   Title Ind with initial HEP    Status Achieved      PT SHORT TERM GOAL #2   Title PROM to protocol goals by 6 weeks (11/28/19)    Status Achieved      PT SHORT TERM GOAL #3   Title Patient ind in [place] for right shoulder    Baseline Pt reporting increased compliance with stretches    Status Partially Met      PT SHORT TERM GOAL #4   Title Pt to demo full AROM of right elbow    Status Achieved             PT Long Term Goals - 02/07/20 1019      PT LONG TERM GOAL #1   Title Improve scapular control with patient to demonstrate elevation of Rt shoulder flexion and scaption to 90-100 deg with minimal upward movement of scapula    Time 6    Period Weeks    Status Revised    Target Date 03/20/20      PT LONG TERM GOAL #2   Title Increase AROM Rt shoulder to WFL's throughout    Time 6    Period Weeks    Status Revised    Target Date 03/20/20      PT LONG TERM GOAL #3    Title 4/5 to 4+/5 strength Rt UE    Time 6    Period Weeks    Status Revised    Target Date 03/20/20      PT LONG TERM GOAL #4   Title Independent in HEP    Time 6    Period Weeks    Status Revised    Target Date 03/20/20      PT LONG TERM GOAL #5   Title Improve FOTO to </= 37% limitation    Time 6    Period Weeks    Status Revised    Target Date 03/20/20                 Plan - 03/06/20 0942    Clinical Impression Statement Continued improvement in tissue extensibility and shoulder mobility/functioin. Patient demonstrates improved scapular stability and control with movement. Persistent muscular tightness lateral scapular border. Good response to DN.    Rehab Potential Good    PT Frequency 2x / week    PT Duration 6 weeks    PT Treatment/Interventions ADLs/Self Care Home Management;Cryotherapy;Electrical Stimulation;Iontophoresis 52m/ml Dexamethasone;Moist Heat;Therapeutic exercise;Therapeutic activities;Neuromuscular re-education;Patient/family education;Manual techniques;Taping;Dry needling;Passive range of motion;Vasopneumatic Device;Joint Manipulations;Spinal Manipulations    PT Next Visit Plan continue with shoulder rehab addressing ROM; scapular control and rhythm; strength and function; continue DN to hands/add shoulder as indicated; continue taping to support deltoid/shoulder; posterior shoulder girdle strengthening    PT Home Exercise Plan [email or web address]    Consulted and Agree with Plan of Care Patient           Patient will benefit from skilled therapeutic intervention in order to improve the following deficits and impairments:     Visit Diagnosis: Stiffness of right shoulder, not elsewhere classified  Acute pain of right shoulder  Muscle weakness (generalized)  Localized edema  Pain in joints of right hand  Pain in left hand     Problem List Patient Active Problem List   Diagnosis Date Noted  . Impingement syndrome of right shoulder 03/28/2019  .  Primary osteoarthritis of  both first carpometacarpal joints 07/29/2018  . Tendonitis of both wrists 06/01/2018  . Numbness and tingling in both hands 06/01/2018  . Eustachian tube dysfunction, bilateral 08/17/2017  . Angio-edema 08/10/2017  . Environmental allergies 07/21/2017  . Seasonal allergies 07/21/2017  . Seasonal allergic rhinitis 07/09/2017  . Post-nasal drip 07/09/2017  . 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 Trapezius muscle spasm 08/21/2016  . Vitamin D deficiency 08/01/2014  . B12 deficiency 08/01/2014  . Prolonged Q-T interval on ECG 10/06/2013  . Major depressive disorder, recurrent episode, moderate ([place]) 12/27/2012  . Generalized anxiety disorder 12/27/2012  . Anxiety 04/27/2012  . Essential hypertension 04/27/2012  . Major depressive disorder 03/31/2012  . Chest pain 03/31/2012  . Shortness of breath 03/31/2012  . Migraine 03/31/2012  . PALPITATIONS 08/02/2009    Christerpher Clos Nilda Simmer PT, MPH  03/06/2020, 10:15 AM  [facility] [place], [place], [address] Phone: [phone]   Fax:  [phone]  Name: Darlene Ochoa MRN: [id number] Date of Birth: Jun 21, 1965

## 2020-03-08 ENCOUNTER — Other Ambulatory Visit: Payer: Self-pay

## 2020-03-08 ENCOUNTER — Ambulatory Visit: Payer: BC Managed Care – PPO | Admitting: Rehabilitative and Restorative Service Providers"

## 2020-03-08 ENCOUNTER — Encounter: Payer: Self-pay | Admitting: Rehabilitative and Restorative Service Providers"

## 2020-03-08 DIAGNOSIS — M25611 Stiffness of right shoulder, not elsewhere classified: Secondary | ICD-10-CM | POA: Diagnosis not present

## 2020-03-08 DIAGNOSIS — R600 Localized edema: Secondary | ICD-10-CM

## 2020-03-08 DIAGNOSIS — M6281 Muscle weakness (generalized): Secondary | ICD-10-CM

## 2020-03-08 DIAGNOSIS — M79642 Pain in left hand: Secondary | ICD-10-CM

## 2020-03-08 DIAGNOSIS — M25511 Pain in right shoulder: Secondary | ICD-10-CM

## 2020-03-08 DIAGNOSIS — M25541 Pain in joints of right hand: Secondary | ICD-10-CM

## 2020-03-08 NOTE — Therapy (Addendum)
[place], [place], [address] Phone: [phone]   Fax:  [phone]  Physical Therapy Treatment  Patient Details  Name: Darlene Ochoa MRN: [id number] Date of Birth: [birth year] Referring Provider (PT): Ophelia Charter, MD   Encounter Date: 03/08/2020   PT End of Session - 03/08/20 0936    Visit Number 31    Number of Visits 32    Date for PT Re-Evaluation 03/20/20    Authorization Type BCBS    PT Start Time [phone]    PT Stop Time 1016   [place] end of treatment x 10 min   PT Time Calculation (min) 40 min    Activity Tolerance Patient tolerated treatment well           Past Medical History:  Diagnosis Date  . Anxiety   . Chest pain 03/31/2012  . Depression   . H/O prolonged Q-T interval on ECG 03/31/2012  . Hypertension   . Major depressive disorder 03/31/2012  . Seasonal allergies     Past Surgical History:  Procedure Laterality Date  . CESAREAN SECTION  1998  . ROTATOR CUFF REPAIR Right 2021    There were no vitals filed for this visit.   Subjective Assessment - 03/08/20 0937    Subjective Shoulder feels great. Still has tightness with stretching behind her. Using Rt UE for more and more functional activities. Did well with DN and manual work last visit.    Currently in Pain? No/denies    Pain Score 0-No pain    Pain Location Shoulder    Pain Orientation Right    Pain Descriptors / Indicators Sore              [facility] PT Assessment - 03/08/20 0001      Assessment   Medical Diagnosis Rt RCR/DCE/SAD    Referring Provider (PT) Ophelia Charter, MD    Onset Date/Surgical Date 10/17/19    Hand Dominance Right      Palpation   Palpation comment muscular tightness Rt upper quarter - deltoid; pecs; upper trap; leveator; teres; biceps; triceps                          [facility] Adult PT Treatment/Exercise - 03/08/20 0001      Shoulder Exercises: Stretch   Wall Stretch - ABduction 3  reps;30 seconds    Wall Stretch - ABduction Limitations stretching into horizontal abduction     Other Shoulder Stretches 3 way doorway stretch 20-30 sec x 3 reps each position - to pt tolerance       Moist Heat Therapy   Number Minutes Moist Heat 10 Minutes    Moist Heat Location Shoulder      Manual Therapy   Manual therapy comments skilled palpation to assess response to DN/manual work     Joint Mobilization [place] jt mobs Rt grade II/III to increase ROM/mobility    Soft tissue mobilization STM to Rt pec, lat, teres major, subscap, upper and mid trap, levator, rhomboid     Myofascial Release anterior chest to anterior shoulder/arm, Lat     Scapular Mobilization Rt     Passive ROM to right shoulder flex/ABD,ER/IR in scapular plane             Trigger Point Dry Needling - 03/08/20 0001    Consent Given? Yes    Education Handout Provided Previously provided    Dry Needling Comments Rt  Upper Trapezius Response Palpable increased muscle length    Levator Scapulae Response Palpable increased muscle length    Deltoid Response Palpable increased muscle length;Twitch response elicited    Teres major Response Palpable increased muscle length    Teres minor Response Palpable increased muscle length    Biceps Response Palpable increased muscle length                  PT Short Term Goals - 01/03/20 0855      PT SHORT TERM GOAL #1   Title Ind with initial HEP    Status Achieved      PT SHORT TERM GOAL #2   Title PROM to protocol goals by 6 weeks (11/28/19)    Status Achieved      PT SHORT TERM GOAL #3   Title Patient ind in [place] for right shoulder    Baseline Pt reporting increased compliance with stretches    Status Partially Met      PT SHORT TERM GOAL #4   Title Pt to demo full AROM of right elbow    Status Achieved             PT Long Term Goals - 02/07/20 1019      PT LONG TERM GOAL #1   Title Improve scapular control with patient to demonstrate  elevation of Rt shoulder flexion and scaption to 90-100 deg with minimal upward movement of scapula    Time 6    Period Weeks    Status Revised    Target Date 03/20/20      PT LONG TERM GOAL #2   Title Increase AROM Rt shoulder to WFL's throughout    Time 6    Period Weeks    Status Revised    Target Date 03/20/20      PT LONG TERM GOAL #3   Title 4/5 to 4+/5 strength Rt UE    Time 6    Period Weeks    Status Revised    Target Date 03/20/20      PT LONG TERM GOAL #4   Title Independent in HEP    Time 6    Period Weeks    Status Revised    Target Date 03/20/20      PT LONG TERM GOAL #5   Title Improve FOTO to </= 37% limitation    Time 6    Period Weeks    Status Revised    Target Date 03/20/20                 Plan - 03/08/20 0940    Clinical Impression Statement Excellent progress overall. Patient continues to demonstrate improved posture and alignment; increased AROM and PROM; Some persistent tightness in the lateral scapular border with some end range tightness.    Rehab Potential Good    PT Frequency 2x / week    PT Duration 6 weeks    PT Treatment/Interventions ADLs/Self Care Home Management;Cryotherapy;Electrical Stimulation;Iontophoresis 71m/ml Dexamethasone;Moist Heat;Therapeutic exercise;Therapeutic activities;Neuromuscular re-education;Patient/family education;Manual techniques;Taping;Dry needling;Passive range of motion;Vasopneumatic Device;Joint Manipulations;Spinal Manipulations    PT Next Visit Plan continue with shoulder rehab addressing ROM; scapular control and rhythm; strength and function; continue DN to hands/add shoulder as indicated; continue taping to support deltoid/shoulder as needed; posterior shoulder girdle strengthening - anticipate d/c at next visit    PT [place] Agree with Plan of Care Patient  Patient will benefit from skilled therapeutic intervention in order to improve the following  deficits and impairments:     Visit Diagnosis: Stiffness of right shoulder, not elsewhere classified  Acute pain of right shoulder  Muscle weakness (generalized)  Localized edema  Pain in joints of right hand  Pain in left hand     Problem List Patient Active Problem List   Diagnosis Date Noted  . Impingement syndrome of right shoulder 03/28/2019  . Primary osteoarthritis of both first carpometacarpal joints 07/29/2018  . Tendonitis of both wrists 06/01/2018  . Numbness and tingling in both hands 06/01/2018  . Eustachian tube dysfunction, bilateral 08/17/2017  . Angio-edema 08/10/2017  . Environmental allergies 07/21/2017  . Seasonal allergies 07/21/2017  . Seasonal allergic rhinitis 07/09/2017  . Post-nasal drip 07/09/2017  . 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 Trapezius muscle spasm 08/21/2016  . Vitamin D deficiency 08/01/2014  . B12 deficiency 08/01/2014  . Prolonged Q-T interval on ECG 10/06/2013  . Major depressive disorder, recurrent episode, moderate ([place]) 12/27/2012  . Generalized anxiety disorder 12/27/2012  . Anxiety 04/27/2012  . Essential hypertension 04/27/2012  . Major depressive disorder 03/31/2012  . Chest pain 03/31/2012  . Shortness of breath 03/31/2012  . Migraine 03/31/2012  . PALPITATIONS 08/02/2009    Emilio Baylock Nilda Simmer PT, MPH  03/08/2020, 10:16 AM  [facility] [place], [place], [address] Phone: [phone]   Fax:  [phone]  Name: Darlene Ochoa MRN: [id number] Date of Birth: [birth year]  PHYSICAL THERAPY DISCHARGE SUMMARY  Visits from Start of Care: 31  Current functional level related to goals / functional outcomes: See progress note for discharge status    Remaining deficits: Unknown    Education / Equipment: HEP  Plan: Patient agrees to discharge.  Patient goals were met. Patient is being discharged due to meeting the stated rehab goals.  ?????    Cady Hafen P. Helene Kelp  PT, MPH 06/11/20 12:27 PM

## 2020-03-14 ENCOUNTER — Ambulatory Visit: Payer: BC Managed Care – PPO | Admitting: Sports Medicine

## 2020-03-14 ENCOUNTER — Other Ambulatory Visit: Payer: Self-pay

## 2020-03-14 ENCOUNTER — Encounter: Payer: BC Managed Care – PPO | Admitting: Rehabilitative and Restorative Service Providers"

## 2020-03-14 ENCOUNTER — Other Ambulatory Visit: Payer: Self-pay | Admitting: Physician Assistant

## 2020-03-14 DIAGNOSIS — R200 Anesthesia of skin: Secondary | ICD-10-CM

## 2020-03-14 DIAGNOSIS — R202 Paresthesia of skin: Secondary | ICD-10-CM

## 2020-03-14 DIAGNOSIS — I10 Essential (primary) hypertension: Secondary | ICD-10-CM

## 2020-03-14 NOTE — Assessment & Plan Note (Signed)
Darlene Ochoa returns, she continues to have bilateral hand pain, she has known [facility] osteoarthritis, and she is in talks with Dr. Mina Marble about [facility] arthroplasty/suspension. Continues to drop objects with pain in the thenar eminence and radiation into the second, third, fourth fingers. I'm going to go ahead and pull the trigger again for nerve conduction and EMG, she had shoulder surgery previously and was unable to do it initially, but also like her to wear her night splints, add gabapentin at night going up to twice a day if no better in a week, and do some rehab conditioning exercises. If insufficient improvement we will proceed with bilateral median nerve Hydro dissections at the next visit.

## 2020-03-14 NOTE — Progress Notes (Signed)
    Procedures performed today:    None.  Independent interpretation of notes and tests performed by another provider:   None.  Brief History, Exam, Impression, and Recommendations:    Numbness and tingling in both hands Desia returns, she continues to have bilateral hand pain, she has known [facility] osteoarthritis, and she is in talks with Dr. Mina Marble about [facility] arthroplasty/suspension. Continues to drop objects with pain in the thenar eminence and radiation into the second, third, fourth fingers. I'm going to go ahead and pull the trigger again for nerve conduction and EMG, she had shoulder surgery previously and was unable to do it initially, but also like her to wear her night splints, add gabapentin at night going up to twice a day if no better in a week, and do some rehab conditioning exercises. If insufficient improvement we will proceed with bilateral median nerve Hydro dissections at the next visit.    ___________________________________________ Ihor Austin. Benjamin Stain, M.D., ABFM., CAQSM. Primary Care and Sports Medicine [facility]  Adjunct Instructor of Family Medicine  [employer]

## 2020-03-26 ENCOUNTER — Telehealth: Payer: Self-pay | Admitting: Sports Medicine

## 2020-03-26 NOTE — Telephone Encounter (Signed)
Dr Hedda Slade with [facility] called stating patient had a bilateral upper study done in March 2020 and wanted to know if you still wanted to have a repeat study done. The results are in her chart. Please advise. - CF

## 2020-03-26 NOTE — Telephone Encounter (Signed)
Yes, let us do it again, she has had a change in symptoms

## 2020-03-27 DIAGNOSIS — F411 Generalized anxiety disorder: Secondary | ICD-10-CM | POA: Diagnosis not present

## 2020-03-27 NOTE — Telephone Encounter (Signed)
I called [facility] to let them know we wanted it done again they will call and schedule patient. - CF

## 2020-03-28 ENCOUNTER — Other Ambulatory Visit: Payer: Self-pay

## 2020-03-28 ENCOUNTER — Encounter: Payer: Self-pay | Admitting: Physician Assistant

## 2020-03-28 ENCOUNTER — Ambulatory Visit: Payer: BC Managed Care – PPO | Admitting: Physician Assistant

## 2020-03-28 VITALS — BP 131/78 | HR 82 | Temp 98.4°F | Ht 65.0 in | Wt 147.0 lb

## 2020-03-28 DIAGNOSIS — R300 Dysuria: Secondary | ICD-10-CM | POA: Diagnosis not present

## 2020-03-28 DIAGNOSIS — N3001 Acute cystitis with hematuria: Secondary | ICD-10-CM | POA: Diagnosis not present

## 2020-03-28 LAB — POCT URINALYSIS DIP (CLINITEK)
Bilirubin, UA: NEGATIVE
Glucose, UA: NEGATIVE mg/dL
Ketones, POC UA: NEGATIVE mg/dL
Nitrite, UA: NEGATIVE
POC PROTEIN,UA: NEGATIVE
Spec Grav, UA: 1.015 (ref 1.010–1.025)
Urobilinogen, UA: 0.2 E.U./dL
pH, UA: 5 (ref 5.0–8.0)

## 2020-03-28 MED ORDER — NITROFURANTOIN MONOHYD MACRO 100 MG PO CAPS: 100.0000 mg | ORAL_CAPSULE | Freq: Two times a day (BID) | ORAL | 0 refills | Status: DC

## 2020-03-28 NOTE — Patient Instructions (Signed)
Urinary Tract Infection, Adult A urinary tract infection (UTI) is an infection of any part of the urinary tract. The urinary tract includes:  The kidneys.  The ureters.  The bladder.  The urethra. These organs make, store, and get rid of pee (urine) in the body. What are the causes? This is caused by germs (bacteria) in your genital area. These germs grow and cause swelling (inflammation) of your urinary tract. What increases the risk? You are more likely to develop this condition if:  You have a small, thin tube (catheter) to drain pee.  You cannot control when you pee or poop (incontinence).  You are female, and: ? You use these methods to prevent pregnancy:  A medicine that kills sperm (spermicide).  A device that blocks sperm (diaphragm). ? You have low levels of a female hormone (estrogen). ? You are pregnant.  You have genes that add to your risk.  You are sexually active.  You take antibiotic medicines.  You have trouble peeing because of: ? A prostate that is bigger than normal, if you are female. ? A blockage in the part of your body that drains pee from the bladder (urethra). ? A kidney stone. ? A nerve condition that affects your bladder (neurogenic bladder). ? Not getting enough to drink. ? Not peeing often enough.  You have other conditions, such as: ? Diabetes. ? A weak disease-fighting system (immune system). ? Sickle cell disease. ? Gout. ? Injury of the spine. What are the signs or symptoms? Symptoms of this condition include:  Needing to pee right away (urgently).  Peeing often.  Peeing small amounts often.  Pain or burning when peeing.  Blood in the pee.  Pee that smells bad or not like normal.  Trouble peeing.  Pee that is cloudy.  Fluid coming from the vagina, if you are female.  Pain in the belly or lower back. Other symptoms include:  Throwing up (vomiting).  No urge to eat.  Feeling mixed up (confused).  Being tired  and grouchy (irritable).  A fever.  Watery poop (diarrhea). How is this treated? This condition may be treated with:  Antibiotic medicine.  Other medicines.  Drinking enough water. Follow these instructions at home:  Medicines  Take over-the-counter and prescription medicines only as told by your doctor.  If you were prescribed an antibiotic medicine, take it as told by your doctor. Do not stop taking it even if you start to feel better. General instructions  Make sure you: ? Pee until your bladder is empty. ? Do not hold pee for a long time. ? Empty your bladder after sex. ? Wipe from front to back after pooping if you are a female. Use each tissue one time when you wipe.  Drink enough fluid to keep your pee pale yellow.  Keep all follow-up visits as told by your doctor. This is important. Contact a doctor if:  You do not get better after 1-2 days.  Your symptoms go away and then come back. Get help right away if:  You have very bad back pain.  You have very bad pain in your lower belly.  You have a fever.  You are sick to your stomach (nauseous).  You are throwing up. Summary  A urinary tract infection (UTI) is an infection of any part of the urinary tract.  This condition is caused by germs in your genital area.  There are many risk factors for a UTI. These include having a small, thin   tube to drain pee and not being able to control when you pee or poop.  Treatment includes antibiotic medicines for germs.  Drink enough fluid to keep your pee pale yellow. This information is not intended to replace advice given to you by your health care provider. Make sure you discuss any questions you have with your health care provider. Document Revised: 03/25/2018 Document Reviewed: 10/15/2017 Elsevier Patient Education  2020 [employer]  

## 2020-03-28 NOTE — Progress Notes (Signed)
Subjective:    Patient ID: Darlene Ochoa, female    DOB: Mar 20, 1966, 54 y.o.   MRN: [id number]  HPI  Pt is a 54 yo female with hx of UTI who presents to the clinic with 2 weeks of off and on but 1 day of severe dysuria and urinary frequency. Last UTI was 7/16. She took azo early on and helped some. No fever, chills, nausea, vomiting, flank pain. Denies any diarrhea. Not aware of any trigger.   .. Active Ambulatory Problems    Diagnosis Date Noted  . PALPITATIONS 08/02/2009  . Major depressive disorder 03/31/2012  . Chest pain 03/31/2012  . Shortness of breath 03/31/2012  . Migraine 03/31/2012  . Anxiety 04/27/2012  . Essential hypertension 04/27/2012  . Major depressive disorder, recurrent episode, moderate (HCC) 12/27/2012  . Generalized anxiety disorder 12/27/2012  . Prolonged Q-T interval on ECG 10/06/2013  . Vitamin D deficiency 08/01/2014  . B12 deficiency 08/01/2014  . Trapezius muscle spasm 08/21/2016  . 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 Seasonal allergic rhinitis 07/09/2017  . Post-nasal drip 07/09/2017  . Environmental allergies 07/21/2017  . Seasonal allergies 07/21/2017  . Angio-edema 08/10/2017  . Eustachian tube dysfunction, bilateral 08/17/2017  . Tendonitis of both wrists 06/01/2018  . Numbness and tingling in both hands 06/01/2018  . Primary osteoarthritis of both first carpometacarpal joints 07/29/2018  . Impingement syndrome of right shoulder 03/28/2019   Resolved Ambulatory Problems    Diagnosis Date Noted  . Acute pain of left shoulder 08/21/2016  . Bilateral hand pain 06/01/2018  . Bilateral thumb pain 06/01/2018  . Bilateral wrist pain 06/01/2018   Past Medical History:  Diagnosis Date  . Depression   . H/O prolonged Q-T interval on ECG 03/31/2012  . Hypertension      Review of Systems  All other systems reviewed and are negative.      Objective:   Physical Exam Vitals reviewed.  Constitutional:      Appearance: Normal appearance.  HENT:      Head: Normocephalic and atraumatic.  Cardiovascular:     Rate and Rhythm: Normal rate and regular rhythm.  Pulmonary:     Effort: Pulmonary effort is normal.     Breath sounds: Normal breath sounds.  Abdominal:     General: There is no distension.     Tenderness: There is abdominal tenderness. There is no right CVA tenderness, left CVA tenderness or guarding.     Comments: Mild suprapubic tenderness to palpation.   Neurological:     General: No focal deficit present.     Mental Status: She is alert and oriented to person, place, and time.  Psychiatric:        Mood and Affect: Mood normal.           Assessment & Plan:  Marland KitchenMarland KitchenRaeleigh was seen today for urinary tract infection.  Diagnoses and all orders for this visit:  Acute cystitis with hematuria -     Urine Culture -     nitrofurantoin, macrocrystal-monohydrate, (MACROBID) 100 MG capsule; Take 1 capsule (100 mg total) by mouth 2 (two) times daily.  Dysuria -     POCT URINALYSIS DIP (CLINITEK) -     Urine Culture -     nitrofurantoin, macrocrystal-monohydrate, (MACROBID) 100 MG capsule; Take 1 capsule (100 mg total) by mouth 2 (two) times daily.   .. Results for orders placed or performed in visit on 03/28/20  POCT URINALYSIS DIP (CLINITEK)  Result Value Ref Range   Color, UA  yellow yellow   Clarity, UA clear clear   Glucose, UA negative negative mg/dL   Bilirubin, UA negative negative   Ketones, POC UA negative negative mg/dL   Spec Grav, UA 7.782 4.235 - 1.025   Blood, UA trace-lysed (A) negative   pH, UA 5.0 5.0 - 8.0   POC PROTEIN,UA negative negative, trace   Urobilinogen, UA 0.2 0.2 or 1.0 E.U./dL   Nitrite, UA Negative Negative   Leukocytes, UA Small (1+) (A) Negative   UA positive for leuks and blood. Will culture. Treated with macrobid based on last culture and medication allergy list.  Discussed symptomatic care.  HO given.  Follow up as needed or if symptoms persist.

## 2020-03-30 LAB — URINE CULTURE
MICRO NUMBER:: 11293842
SPECIMEN QUALITY:: ADEQUATE

## 2020-03-30 NOTE — Progress Notes (Signed)
How are you feeling? No significant bacteria growth was detected.

## 2020-04-04 ENCOUNTER — Other Ambulatory Visit: Payer: Self-pay

## 2020-04-04 ENCOUNTER — Ambulatory Visit (INDEPENDENT_AMBULATORY_CARE_PROVIDER_SITE_OTHER): Payer: BC Managed Care – PPO

## 2020-04-04 ENCOUNTER — Ambulatory Visit: Payer: BC Managed Care – PPO | Admitting: Sports Medicine

## 2020-04-04 DIAGNOSIS — M5416 Radiculopathy, lumbar region: Secondary | ICD-10-CM

## 2020-04-04 DIAGNOSIS — M4126 Other idiopathic scoliosis, lumbar region: Secondary | ICD-10-CM

## 2020-04-04 DIAGNOSIS — M545 Low back pain, unspecified: Secondary | ICD-10-CM | POA: Diagnosis not present

## 2020-04-04 MED ORDER — PREDNISONE 50 MG PO TABS: ORAL_TABLET | ORAL | 0 refills | Status: DC

## 2020-04-04 NOTE — Progress Notes (Signed)
    Procedures performed today:    None.  Independent interpretation of notes and tests performed by another provider:   None.  Brief History, Exam, Impression, and Recommendations:    Right lumbar radiculitis This is a pleasant 54 year old female, recently she has developed numbness going down the back of her thigh, radiating to the lateral aspect of her foot. No progressive weakness, no bowel or bladder dysfunction, saddle numbness, she likely has lumbar radiculitis, right-sided S1 distribution. We will start conservatively with 5 days of prednisone, x-rays, home rehab exercises, return to see me in 4 to 6 weeks, MRI for interventional planning if no better.    ___________________________________________ Ihor Austin. Benjamin Stain, M.D., ABFM., CAQSM. Primary Care and Sports Medicine [facility]  Adjunct Instructor of Family Medicine  [employer]

## 2020-04-04 NOTE — Assessment & Plan Note (Signed)
This is a pleasant 54 year old female, recently she has developed numbness going down the back of her thigh, radiating to the lateral aspect of her foot. No progressive weakness, no bowel or bladder dysfunction, saddle numbness, she likely has lumbar radiculitis, right-sided S1 distribution. We will start conservatively with 5 days of prednisone, x-rays, home rehab exercises, return to see me in 4 to 6 weeks, MRI for interventional planning if no better.

## 2020-04-06 ENCOUNTER — Ambulatory Visit (INDEPENDENT_AMBULATORY_CARE_PROVIDER_SITE_OTHER): Payer: BC Managed Care – PPO | Admitting: Physician Assistant

## 2020-04-06 ENCOUNTER — Other Ambulatory Visit: Payer: Self-pay

## 2020-04-06 ENCOUNTER — Telehealth: Payer: Self-pay

## 2020-04-06 VITALS — BP 137/84 | HR 78 | Temp 98.4°F

## 2020-04-06 DIAGNOSIS — R300 Dysuria: Secondary | ICD-10-CM

## 2020-04-06 MED ORDER — PHENAZOPYRIDINE HCL 200 MG PO TABS: 200.0000 mg | ORAL_TABLET | Freq: Three times a day (TID) | ORAL | 0 refills | Status: DC

## 2020-04-06 NOTE — Telephone Encounter (Signed)
On 12/8 her urine culture did not actually grow clinically significant bacteria to be UTI. Lets have her bring urine sample to lab to check culture one more time. Ok to send pyridium 200mg  3 times a day for 2 days over the weekend. Come today for the sample so we can know next steps on Monday.

## 2020-04-06 NOTE — Progress Notes (Signed)
Pt came in to have repeat of a UCX done. Specimen collected and sent for testing

## 2020-04-06 NOTE — Telephone Encounter (Signed)
Called patient regarding xray results and she mentioned that her UTI symptoms have not completely resolved.  She completed the antibiotics but the urgency is still present.  She reports that when she goes to the restroom, she is not getting much out either. Please advise.

## 2020-04-08 LAB — URINE CULTURE
MICRO NUMBER:: 11334824
Result:: NO GROWTH
SPECIMEN QUALITY:: ADEQUATE

## 2020-04-09 ENCOUNTER — Other Ambulatory Visit: Payer: Self-pay | Admitting: Neurology

## 2020-04-09 DIAGNOSIS — R300 Dysuria: Secondary | ICD-10-CM

## 2020-04-09 DIAGNOSIS — N3001 Acute cystitis with hematuria: Secondary | ICD-10-CM

## 2020-04-09 NOTE — Progress Notes (Signed)
Your urinary symptoms you are experiencing are not infection. No growth on culture. This could represents something called chronic or interstitial cystitis. More testing needs to be done. Are you ok with urology referral?

## 2020-04-10 ENCOUNTER — Encounter: Payer: BC Managed Care – PPO | Admitting: Neurology

## 2020-04-10 DIAGNOSIS — F411 Generalized anxiety disorder: Secondary | ICD-10-CM | POA: Diagnosis not present

## 2020-04-11 ENCOUNTER — Ambulatory Visit: Payer: BC Managed Care – PPO | Admitting: Sports Medicine

## 2020-04-18 NOTE — Telephone Encounter (Signed)
Spoke with patient on 04/06/20, she came in for a urine sample to be sent for culture per recommendations.

## 2020-05-22 DIAGNOSIS — F411 Generalized anxiety disorder: Secondary | ICD-10-CM | POA: Diagnosis not present

## 2020-05-31 DIAGNOSIS — F3342 Major depressive disorder, recurrent, in full remission: Secondary | ICD-10-CM | POA: Diagnosis not present

## 2020-05-31 DIAGNOSIS — F411 Generalized anxiety disorder: Secondary | ICD-10-CM | POA: Diagnosis not present

## 2020-06-04 ENCOUNTER — Other Ambulatory Visit: Payer: Self-pay | Admitting: Physician Assistant

## 2020-06-04 DIAGNOSIS — I10 Essential (primary) hypertension: Secondary | ICD-10-CM

## 2020-07-03 DIAGNOSIS — F411 Generalized anxiety disorder: Secondary | ICD-10-CM | POA: Diagnosis not present

## 2020-07-13 ENCOUNTER — Emergency Department
Admission: EM | Admit: 2020-07-13 | Discharge: 2020-07-13 | Disposition: A | Discharge status: Home / Self Care | Payer: BC Managed Care – PPO | Source: Home / Self Care | Attending: Family Medicine | Admitting: Family Medicine

## 2020-07-13 ENCOUNTER — Other Ambulatory Visit: Payer: Self-pay

## 2020-07-13 DIAGNOSIS — R0789 Other chest pain: Secondary | ICD-10-CM | POA: Diagnosis not present

## 2020-07-13 NOTE — ED Notes (Signed)
Dr. Cathren Harsh notified of pt's s/s and shown EKG.

## 2020-07-13 NOTE — ED Triage Notes (Signed)
Pt presents to Urgent Care with c/o L chest "burning" and sob x approx 2 hours. Also c/o palpitations. Denies nausea.

## 2020-07-13 NOTE — Discharge Instructions (Addendum)
Apply ice pack for 20 to 30 minutes, 3 to 4 times daily  Continue until pain decreases.  May take Ibuprofen 200mg, 4 tabs every 8 hours with food.  

## 2020-07-13 NOTE — ED Provider Notes (Signed)
[facility]    CSN: [id number] Arrival date & time: 07/13/20  1456      History   Chief Complaint Chief Complaint  Patient presents with  . Chest Pain  . Shortness of Breath    HPI Darlene Ochoa is a 55 y.o. female.   At lunchtime today, at about 1pm, patient experienced a burning sensation in her anterior chest and a sensation of her heart racing. She feels tightness in her anterior chest.  She has felt winded but not shortness of breath, and has felt mildly fatigued.  She denies nausea/vomiting, cough, and fevers, chills, and sweats.  Her chest discomfort does not radiate.  She states that her discomfort feels similar to costochondritis that she had at age 52, treated with N[place]on. Family history:  Father has an irregular heart rhythm; mother with hypertension.  The history is provided by the patient.  Chest Pain Pain location:  Substernal area Pain quality: burning   Pain radiates to:  Does not radiate Pain severity:  Mild Onset quality:  Sudden Duration:  2 hours Timing:  Constant Progression:  Unchanged Context: breathing and at rest   Relieved by:  None tried Worsened by:  Deep breathing Ineffective treatments:  None tried Associated symptoms: palpitations and shortness of breath   Associated symptoms: no abdominal pain, no AICD problem, no anorexia, no anxiety, no back pain, no cough, no diaphoresis, no dysphagia, no fatigue, no fever, no headache, no heartburn, no lower extremity edema, no nausea and no weakness   Shortness of Breath Associated symptoms: chest pain   Associated symptoms: no abdominal pain, no cough, no diaphoresis, no fever, no headaches and no wheezing     Past Medical History:  Diagnosis Date  . Anxiety   . Chest pain 03/31/2012  . Depression   . H/O prolonged Q-T interval on ECG 03/31/2012  . Hypertension   . Major depressive disorder 03/31/2012  . Seasonal allergies     Patient Active Problem List   Diagnosis Date Noted  .  Right lumbar radiculitis 04/04/2020  . Impingement syndrome of right shoulder 03/28/2019  . Primary osteoarthritis of both first carpometacarpal joints 07/29/2018  . Tendonitis of both wrists 06/01/2018  . Numbness and tingling in both hands 06/01/2018  . Eustachian tube dysfunction, bilateral 08/17/2017  . Angio-edema 08/10/2017  . Environmental allergies 07/21/2017  . Seasonal allergies 07/21/2017  . Seasonal allergic rhinitis 07/09/2017  . Post-nasal drip 07/09/2017  . 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 Trapezius muscle spasm 08/21/2016  . Vitamin D deficiency 08/01/2014  . B12 deficiency 08/01/2014  . Prolonged Q-T interval on ECG 10/06/2013  . Major depressive disorder, recurrent episode, moderate (HCC) 12/27/2012  . Generalized anxiety disorder 12/27/2012  . Anxiety 04/27/2012  . Essential hypertension 04/27/2012  . Major depressive disorder 03/31/2012  . Chest pain 03/31/2012  . Shortness of breath 03/31/2012  . Migraine 03/31/2012  . PALPITATIONS 08/02/2009    Past Surgical History:  Procedure Laterality Date  . CESAREAN SECTION  1998  . ROTATOR CUFF REPAIR Right 2021    OB History   No obstetric history on file.      Home Medications    Prior to Admission medications   Medication Sig Start Date End Date Taking? Authorizing Provider  buPROPion (WELLBUTRIN XL) 150 MG 24 hr tablet Take 150 mg by mouth daily. 11/25/16   [provider]  buPROPion (WELLBUTRIN XL) 300 MG 24 hr tablet Take 300 mg by mouth daily. 10/28/16   [provider]  Cholecalciferol (VITAMIN D3) 50000 units CAPS Vitamin D2 50,000 unit capsule    [provider]  diclofenac Sodium (VOLTAREN) 1 % GEL APPLY 4 G TOPICALLY 4 (FOUR) TIMES DAILY. TO AFFECTED JOINT. 09/27/19   Tandy Gaw L, PA-C  FLUoxetine (PROZAC) 20 MG capsule Take 60 mg by mouth every morning. 02/23/20   [provider]  gabapentin (NEURONTIN) 300 MG capsule One tab PO qHS for a week, then BID for a  week, then TID. May double weekly to a max of 3,600mg /day 12/17/18   Rodolph Bong, MD  hydrochlorothiazide (HYDRODIURIL) 25 MG tablet TAKE 1 TABLET DAILY. APPOINTMENT, LABS FOR FURTHER REFILLS. 06/04/20   Breeback, Lonna Cobb, PA-C  nitrofurantoin, macrocrystal-monohydrate, (MACROBID) 100 MG capsule Take 1 capsule (100 mg total) by mouth 2 (two) times daily. 03/28/20   Breeback, Lonna Cobb, PA-C  phenazopyridine (PYRIDIUM) 200 MG tablet Take 1 tablet (200 mg total) by mouth 3 (three) times daily. 04/06/20   Breeback, Jade L, PA-C  predniSONE (DELTASONE) 50 MG tablet One tab PO daily for 5 days. 04/04/20   Monica Becton, MD  promethazine (PHENERGAN) 25 MG tablet Take 1 tablet (25 mg total) by mouth 2 (two) times daily as needed for nausea or vomiting. 11/04/19   Bing Neighbors, FNP  QUEtiapine (SEROQUEL) 25 MG tablet Take 25 mg by mouth at bedtime. 03/24/20   [provider]  SUMAtriptan (IMITREX) 50 MG tablet Take 1 tablet (50 mg total) by mouth every 2 (two) hours as needed for migraine. May repeat in 2 hours if headache persists or recurs. 12/04/16   Carlis Stable, PA-C    Family History Family History  Problem Relation Age of Onset  . Diabetes Mother   . Cancer Mother        Breast  . Depression Mother   . CAD Mother   . Heart failure Father   . Stroke Father   . Alcohol abuse Father   . Alcohol abuse Brother   . Drug abuse Brother     Social History Social History   Tobacco Use  . Smoking status: Never Smoker  . Smokeless tobacco: Never Used  Substance Use Topics  . Alcohol use: No  . Drug use: No     Allergies   Cefuroxime, [place], Peanut oil, Pork allergy, Doxycycline, Lactose intolerance (gi), Peanut-containing drug products, Trazodone and nefazodone, Codeine, Penicillins, and Sulfonamide derivatives   Review of Systems Review of Systems  Constitutional: Negative for activity change, appetite change, chills, diaphoresis, fatigue and fever.   HENT: Negative for trouble swallowing.   Eyes: Negative.   Respiratory: Positive for chest tightness and shortness of breath. Negative for cough, choking, wheezing and stridor.   Cardiovascular: Positive for chest pain and palpitations.  Gastrointestinal: Negative for abdominal pain, anorexia, heartburn and nausea.  Genitourinary: Negative.   Musculoskeletal: Negative for back pain.  Neurological: Negative for weakness and headaches.     Physical Exam Triage Vital Signs ED Triage Vitals  Enc Vitals Group     BP 07/13/20 1509 (!) 133/93     Pulse Rate 07/13/20 1509 85     Resp 07/13/20 1509 20     Temp 07/13/20 1509 98.8 F (37.1 C)     Temp Source 07/13/20 1509 Oral     SpO2 07/13/20 1509 99 %     Weight 07/13/20 1520 140 lb (63.5 kg)     Height 07/13/20 1520 5\' 5"  (1.651 m)     Head  Circumference --      Peak Flow --      Pain Score 07/13/20 1520 4     Pain Loc --      Pain Edu? --      Excl. in GC? --    No data found.  Updated Vital Signs BP (!) 133/93 (BP Location: Left Arm)   Pulse 85   Temp 98.8 F (37.1 C) (Oral)   Resp 20   Ht 5\' 5"  (1.651 m)   Wt 63.5 kg   LMP 06/18/2017 (Approximate)   SpO2 99%   BMI 23.30 kg/m   Visual Acuity Right Eye Distance:   Left Eye Distance:   Bilateral Distance:    Right Eye Near:   Left Eye Near:    Bilateral Near:     Physical Exam Vitals and nursing note reviewed.  Constitutional:      General: She is not in acute distress. HENT:     Head: Normocephalic.     Mouth/Throat:     Mouth: Mucous membranes are moist.     Pharynx: Oropharynx is clear.  Eyes:     Extraocular Movements: Extraocular movements intact.     Conjunctiva/sclera: Conjunctivae normal.     Pupils: Pupils are equal, round, and reactive to light.  Cardiovascular:     Rate and Rhythm: Normal rate and regular rhythm.     Heart sounds: Normal heart sounds.  Pulmonary:     Breath sounds: Normal breath sounds.  Chest:     Chest wall: Tenderness  present.       Comments: Chest:  Distinct tenderness to palpation over the mid-sternum and below as noted on diagram.  Palpation there recreates patient's discomfort. Abdominal:     Palpations: Abdomen is soft.     Tenderness: There is no abdominal tenderness.  Musculoskeletal:        General: No swelling or tenderness.     Cervical back: Neck supple.     Right lower leg: No edema.     Left lower leg: No edema.  Lymphadenopathy:     Cervical: No cervical adenopathy.  Skin:    General: Skin is warm and dry.     Findings: No rash.  Neurological:     Mental Status: She is alert and oriented to person, place, and time.      UC Treatments / Results  Labs (all labs ordered are listed, but only abnormal results are displayed) Labs Reviewed  TROPONIN I (HIGH SENSITIVITY)    EKG  Rate:  77 BPM PR:  128 msec QT:  418 msec QTcH:  473 msec QRSD:  92 msec QRS axis:  55 degrees Interpretation:  Non-specific ST and T wave abnormality; normal sinus rhythm   Radiology No results found.  Procedures Procedures (including critical care time)  Medications Ordered in UC Medications - No data to display  Initial Impression / Assessment and Plan / UC Course  I have reviewed the triage vital signs and the nursing notes.  Pertinent labs & imaging results that were available during my care of the patient were reviewed by me and considered in my medical decision making (see chart for details).     Review of records reveals that today's EKG similar to EKG done 20 October 2013.  Suspect costochondritis, which patient has had in the past.  Doubt ACS, but as a precaution will check stat Troponin I. Administered aspirin 324mg  PO. If symptoms become significantly worse during the night or over the weekend,  proceed to the local emergency room.  Followup with Dr. Rodney Langton ([facility]) if not improving about two weeks.    Final Clinical Impressions(s) / UC Diagnoses    Final diagnoses:  Atypical chest pain     Discharge Instructions     Apply ice pack for 20 to 30 minutes, 3 to 4 times daily  Continue until pain decreases.  May take Ibuprofen 200mg , 4 tabs every 8 hours with food.     ED Prescriptions    None        , MD 07/15/20 1038

## 2020-07-13 NOTE — ED Notes (Signed)
Pt a/o and w/o additional complaints. D/c'd. [facility] aware of stat pickup for troponin level.

## 2020-07-14 LAB — TIQ- MISLABELED: Test Ordered On Req: 34693

## 2020-07-16 LAB — TROPONIN I: Troponin I: 4 ng/L (ref <=47)

## 2020-07-31 DIAGNOSIS — F411 Generalized anxiety disorder: Secondary | ICD-10-CM | POA: Diagnosis not present

## 2020-08-15 DIAGNOSIS — F411 Generalized anxiety disorder: Secondary | ICD-10-CM | POA: Diagnosis not present

## 2020-08-20 ENCOUNTER — Other Ambulatory Visit: Payer: Self-pay

## 2020-08-20 ENCOUNTER — Telehealth: Payer: Self-pay | Admitting: Neurology

## 2020-08-20 ENCOUNTER — Ambulatory Visit: Payer: BC Managed Care – PPO | Admitting: Physician Assistant

## 2020-08-20 ENCOUNTER — Other Ambulatory Visit: Payer: Self-pay | Admitting: Physician Assistant

## 2020-08-20 ENCOUNTER — Encounter: Payer: Self-pay | Admitting: Physician Assistant

## 2020-08-20 VITALS — BP 106/70 | HR 80 | Ht 65.0 in | Wt 140.0 lb

## 2020-08-20 DIAGNOSIS — Z1211 Encounter for screening for malignant neoplasm of colon: Secondary | ICD-10-CM

## 2020-08-20 DIAGNOSIS — Z79899 Other long term (current) drug therapy: Secondary | ICD-10-CM

## 2020-08-20 DIAGNOSIS — Z1231 Encounter for screening mammogram for malignant neoplasm of breast: Secondary | ICD-10-CM

## 2020-08-20 DIAGNOSIS — M180 Bilateral primary osteoarthritis of first carpometacarpal joints: Secondary | ICD-10-CM

## 2020-08-20 DIAGNOSIS — R200 Anesthesia of skin: Secondary | ICD-10-CM

## 2020-08-20 DIAGNOSIS — R202 Paresthesia of skin: Secondary | ICD-10-CM

## 2020-08-20 DIAGNOSIS — Z1329 Encounter for screening for other suspected endocrine disorder: Secondary | ICD-10-CM | POA: Diagnosis not present

## 2020-08-20 DIAGNOSIS — I10 Essential (primary) hypertension: Secondary | ICD-10-CM | POA: Diagnosis not present

## 2020-08-20 DIAGNOSIS — Z13220 Encounter for screening for lipoid disorders: Secondary | ICD-10-CM | POA: Diagnosis not present

## 2020-08-20 DIAGNOSIS — M7661 Achilles tendinitis, right leg: Secondary | ICD-10-CM

## 2020-08-20 DIAGNOSIS — M5416 Radiculopathy, lumbar region: Secondary | ICD-10-CM

## 2020-08-20 MED ORDER — MELOXICAM 15 MG PO TABS: 15.0000 mg | ORAL_TABLET | Freq: Every day | ORAL | 1 refills | Status: DC

## 2020-08-20 MED ORDER — GABAPENTIN 300 MG PO CAPS: ORAL_CAPSULE | ORAL | 3 refills | Status: DC

## 2020-08-20 NOTE — Telephone Encounter (Signed)
Cologuard order faxed to [phone] with confirmation received. They will contact the patient directly.   

## 2020-08-20 NOTE — Progress Notes (Signed)
Subjective:    Patient ID: Darlene Ochoa, female    DOB: Feb 25, 1966, 55 y.o.   MRN: [id number]  HPI  Pt is a 55 yo female with HTN, Migraines, GAD, right lumbar radiculitis, numbness and tingling of both hands who presents to the clinic who is dealing with a lot pain in hands, right leg and now right heel.   She has seen Dr. Karie Schwalbe for both hands and right leg. She has also seen orthopedics who suggested surgery for hands but she just cannot do surgery right now. She only tried gabapentin at night and was confused by instructions so stopped. She really wants something for pain. It is hard for her to use her hands and now it is hard for her to walk. Her right heel and calf is worse than left. No injury. She cannot walk right now on it.   BH-manages anxiety.  .. Active Ambulatory Problems    Diagnosis Date Noted  . PALPITATIONS 08/02/2009  . Major depressive disorder 03/31/2012  . Chest pain 03/31/2012  . Shortness of breath 03/31/2012  . Migraine 03/31/2012  . Anxiety 04/27/2012  . Essential hypertension 04/27/2012  . Major depressive disorder, recurrent episode, moderate (HCC) 12/27/2012  . Generalized anxiety disorder 12/27/2012  . Prolonged Q-T interval on ECG 10/06/2013  . Vitamin D deficiency 08/01/2014  . B12 deficiency 08/01/2014  . Trapezius muscle spasm 08/21/2016  . 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 Seasonal allergic rhinitis 07/09/2017  . Post-nasal drip 07/09/2017  . Environmental allergies 07/21/2017  . Seasonal allergies 07/21/2017  . Angio-edema 08/10/2017  . Eustachian tube dysfunction, bilateral 08/17/2017  . Tendonitis of both wrists 06/01/2018  . Numbness and tingling in both hands 06/01/2018  . Primary osteoarthritis of both first carpometacarpal joints 07/29/2018  . Impingement syndrome of right shoulder 03/28/2019  . Right lumbar radiculitis 04/04/2020  . Right Achilles tendinitis 08/22/2020   Resolved Ambulatory Problems    Diagnosis Date Noted  . Acute pain of  left shoulder 08/21/2016  . Bilateral hand pain 06/01/2018  . Bilateral thumb pain 06/01/2018  . Bilateral wrist pain 06/01/2018   Past Medical History:  Diagnosis Date  . Depression   . H/O prolonged Q-T interval on ECG 03/31/2012  . Hypertension     Review of Systems See HPI.     Objective:   Physical Exam Vitals reviewed.  Constitutional:      Appearance: Normal appearance.  HENT:     Head: Normocephalic.  Cardiovascular:     Rate and Rhythm: Normal rate and regular rhythm.     Pulses: Normal pulses.  Pulmonary:     Effort: Pulmonary effort is normal.     Breath sounds: Normal breath sounds.  Musculoskeletal:     Right lower leg: No edema.     Left lower leg: No edema.     Comments: Bilateral swollen and tenderness over theniar emience and CMP joint.   Tenderness over insertion of both achilles tendons. Right worse than left.  No calf tenderness or swelling.  No heel pain.  NROM of bilateral feet.  Pain with heel raise.   Neurological:     General: No focal deficit present.     Mental Status: She is alert and oriented to person, place, and time.  Psychiatric:        Mood and Affect: Mood normal.           Assessment & Plan:  Marland KitchenMarland KitchenDamyiah was seen today for follow-up.  Diagnoses and all orders for this  visit:  Essential hypertension -     COMPLETE METABOLIC PANEL WITH GFR -     hydrochlorothiazide (HYDRODIURIL) 25 MG tablet; TAKE 1 TABLET DAILY.  Medication management -     CBC with Differential/Platelet -     COMPLETE METABOLIC PANEL WITH GFR -     Lipid Panel w/reflex Direct LDL -     TSH  Thyroid disorder screen -     TSH  Lipid screening -     Lipid Panel w/reflex Direct LDL  Colon cancer screening -     Cologuard  Encounter for screening mammogram for malignant neoplasm of breast -     MM 3D SCREEN BREAST BILATERAL  Right Achilles tendinitis -     gabapentin (NEURONTIN) 300 MG capsule; One tab PO qHS for a week, then BID for a week, then  TID. May double weekly to a max of 3,600mg /day -     meloxicam (MOBIC) 15 MG tablet; Take 1 tablet (15 mg total) by mouth daily.  Right lumbar radiculitis -     gabapentin (NEURONTIN) 300 MG capsule; One tab PO qHS for a week, then BID for a week, then TID. May double weekly to a max of 3,600mg /day -     meloxicam (MOBIC) 15 MG tablet; Take 1 tablet (15 mg total) by mouth daily.  Numbness and tingling in both hands -     gabapentin (NEURONTIN) 300 MG capsule; One tab PO qHS for a week, then BID for a week, then TID. May double weekly to a max of 3,600mg /day -     meloxicam (MOBIC) 15 MG tablet; Take 1 tablet (15 mg total) by mouth daily.  Primary osteoarthritis of both first carpometacarpal joints -     gabapentin (NEURONTIN) 300 MG capsule; One tab PO qHS for a week, then BID for a week, then TID. May double weekly to a max of 3,600mg /day -     meloxicam (MOBIC) 15 MG tablet; Take 1 tablet (15 mg total) by mouth daily.   BP to goal. Refilled HCTZ.  cmp ordered.   Discussed pain of multiple areas.  Added mobic. Kidney function looked good. Given gel as well to rub over achilles and hands.  Restart gabapentin and explained how to titrate and what side effects to look for.  Ok with tylenol 1000mg  as needed up to 3 times a day.  Place in cam boot for rest of right achilles tendon since patient feels like she cannot walk due to pain.  Follow up in 4 weeks.  Start achilles exercises.  Ice regularly.  Follow up with Dr.T or ortho.

## 2020-08-20 NOTE — Patient Instructions (Signed)
[facility] Tend[facility] Ask your health care provider which exercises are safe for you. Do exercises exactly as told by your health care provider and adjust them as directed. It is normal to feel mild stretching, pulling, tightness, or discomfort as you do these exercises. Stop right away if you feel sudden pain or your pain gets worse. Do not begin these exercises until told by your health care provider. Stretching and range-of-motion exercises These exercises warm up your muscles and joints and improve the movement and flexibility of your ankle. These exercises also help to relieve pain. Standing wall calf stretch with straight knee 1. Stand with your hands against a wall. 2. Extend your left / right leg behind you, and bend your front knee slightly. Keep both of your heels on the floor. 3. Point the toes of your back foot slightly inward. 4. Keeping your heels on the floor and your back knee straight, shift your weight toward the wall. Do not allow your back to arch. You should feel a gentle stretch in your upper calf. 5. Hold this position for __________ seconds. Repeat __________ times. Complete this exercise __________ times a day.   Standing wall calf stretch with bent knee 1. Stand with your hands against a wall. 2. Extend your left / right leg behind you, and bend your front knee slightly. Keep both of your heels on the floor. 3. Point the toes of your back foot slightly inward. 4. Keeping your heels on the floor, bend your back knee slightly. You should feel a gentle stretch deep in your lower calf near your heel. 5. Hold this position for __________ seconds. Repeat __________ times. Complete this exercise __________ times a day. Strengthening exercises These exercises build strength and control of your ankle. Endurance is the ability to use your muscles for a long time, even after they get tired. Plantar flexion with band In this exercise, you push your toes downward, away from you,  with an exercise band providing resistance. 1. Sit on the floor with your left / right leg extended. You may put a pillow under your calf to give your foot more room to move. 2. Loop a rubber exercise band or tube around the ball of your left / right foot. The ball of your foot is on the walking surface, right under your toes. The band or tube should be slightly tense when your foot is relaxed. If the band or tube slips, you can put on your shoe or put a washcloth between the band and your foot to help it stay in place. 3. Slowly point your toes downward, pushing them away from you (plantar flexion). 4. Hold this position for __________ seconds. 5. Slowly release the tension in the band or tube, controlling smoothly until your foot is back to the starting position. 6. Repeat steps 1-5 with your left / right leg. Repeat __________ times. Complete this exercise __________ times a day.   Eccentric heel drop In this exercise, you stand and slowly raise your heel and then slowly lower it. This exercise lengthens the calf muscles (eccentric) while the heel bears weight. If this exercise is too easy, try doing it while wearing a backpack with weights in it. 1. Stand on a step with the balls of your feet. The ball of your foot is on the walking surface, right under your toes. ? Do not put your heels on the step. ? For balance, rest your hands on the wall or on a railing. 2. Rise   up onto the balls of your feet. 3. Keeping your heels up, shift all of your weight to your left / right leg and pick up your other leg. 4. Slowly lower your left / right leg so your heel drops below the level of the step. 5. Put down your other foot before returning to the start position. If told by your health care provider, build up to: ? 3 sets of 15 repetitions while keeping your knees straight. ? 3 sets of 15 repetitions while keeping your knees slightly bent as far as told by your health care provider. Repeat __________  times. Complete this exercise __________ times a day.   Balance exercises These exercises improve or maintain your balance. Balance is important in preventing falls. Single leg stand If this exercise is too easy, you can try it with your eyes closed or while standing on a pillow. 1. Without shoes, stand near a railing or in a door frame. Hold on to the railing or door frame as needed. 2. Stand on your left / right foot. Keep your big toe down on the floor and try to keep your arch lifted. 3. Hold this position for __________ seconds. Repeat __________ times. Complete this exercise __________ times a day. This information is not intended to replace advice given to you by your health care provider. Make sure you discuss any questions you have with your health care provider. Document Revised: 07/26/2018 Document Reviewed: 01/18/2018 Elsevier Patient Education  2021 [employer]  

## 2020-08-21 NOTE — Telephone Encounter (Signed)
PA

## 2020-08-22 ENCOUNTER — Encounter: Payer: Self-pay | Admitting: Physician Assistant

## 2020-08-22 DIAGNOSIS — M7661 Achilles tendinitis, right leg: Secondary | ICD-10-CM | POA: Insufficient documentation

## 2020-08-22 MED ORDER — HYDROCHLOROTHIAZIDE 25 MG PO TABS: ORAL_TABLET | ORAL | 1 refills | Status: DC

## 2020-09-05 DIAGNOSIS — F411 Generalized anxiety disorder: Secondary | ICD-10-CM | POA: Diagnosis not present

## 2020-09-10 DIAGNOSIS — I10 Essential (primary) hypertension: Secondary | ICD-10-CM | POA: Diagnosis not present

## 2020-09-10 DIAGNOSIS — Z1329 Encounter for screening for other suspected endocrine disorder: Secondary | ICD-10-CM | POA: Diagnosis not present

## 2020-09-10 DIAGNOSIS — Z79899 Other long term (current) drug therapy: Secondary | ICD-10-CM | POA: Diagnosis not present

## 2020-09-10 DIAGNOSIS — Z13220 Encounter for screening for lipoid disorders: Secondary | ICD-10-CM | POA: Diagnosis not present

## 2020-09-11 ENCOUNTER — Encounter: Payer: Self-pay | Admitting: Physician Assistant

## 2020-09-11 DIAGNOSIS — E7800 Pure hypercholesterolemia, unspecified: Secondary | ICD-10-CM | POA: Insufficient documentation

## 2020-09-11 LAB — COMPLETE METABOLIC PANEL WITH GFR
AG Ratio: 1.8 (calc) (ref 1.0–2.5)
ALT: 15 U/L (ref 6–29)
AST: 16 U/L (ref 10–35)
Albumin: 4.3 g/dL (ref 3.6–5.1)
Alkaline phosphatase (APISO): 91 U/L (ref 37–153)
BUN: 22 mg/dL (ref 7–25)
CO2: 29 mmol/L (ref 20–32)
Calcium: 9.5 mg/dL (ref 8.6–10.4)
Chloride: 106 mmol/L (ref 98–110)
Creat: 0.89 mg/dL (ref 0.50–1.05)
GFR, Est African American: 85 mL/min/{1.73_m2} (ref >=60)
GFR, Est Non African American: 73 mL/min/{1.73_m2} (ref >=60)
Globulin: 2.4 g/dL (calc) (ref 1.9–3.7)
Glucose, Bld: 92 mg/dL (ref 65–99)
Potassium: 4 mmol/L (ref 3.5–5.3)
Sodium: 141 mmol/L (ref 135–146)
Total Bilirubin: 0.4 mg/dL (ref 0.2–1.2)
Total Protein: 6.7 g/dL (ref 6.1–8.1)

## 2020-09-11 LAB — CBC WITH DIFFERENTIAL/PLATELET
Absolute Monocytes: 335 cells/uL (ref 200–950)
Basophils Absolute: 22 cells/uL (ref 0–200)
Basophils Relative: 0.5 %
Eosinophils Absolute: 151 cells/uL (ref 15–500)
Eosinophils Relative: 3.5 %
HCT: 37.8 % (ref 35.0–45.0)
Hemoglobin: 12.6 g/dL (ref 11.7–15.5)
Lymphs Abs: 1608 cells/uL (ref 850–3900)
MCH: 29.8 pg (ref 27.0–33.0)
MCHC: 33.3 g/dL (ref 32.0–36.0)
MCV: 89.4 fL (ref 80.0–100.0)
MPV: 10.9 fL (ref 7.5–12.5)
Monocytes Relative: 7.8 %
Neutro Abs: 2184 cells/uL (ref 1500–7800)
Neutrophils Relative %: 50.8 %
Platelets: 209 10*3/uL (ref 140–400)
RBC: 4.23 10*6/uL (ref 3.80–5.10)
RDW: 12.4 % (ref 11.0–15.0)
Total Lymphocyte: 37.4 %
WBC: 4.3 10*3/uL (ref 3.8–10.8)

## 2020-09-11 LAB — LIPID PANEL W/REFLEX DIRECT LDL
Cholesterol: 212 mg/dL — ABNORMAL HIGH (ref <200)
HDL: 53 mg/dL (ref >=50)
LDL Cholesterol (Calc): 142 mg/dL (calc) — ABNORMAL HIGH
Non-HDL Cholesterol (Calc): 159 mg/dL (calc) — ABNORMAL HIGH (ref <130)
Total CHOL/HDL Ratio: 4 (calc) (ref <5.0)
Triglycerides: 78 mg/dL (ref <150)

## 2020-09-11 LAB — TSH: TSH: 0.91 mIU/L

## 2020-09-11 NOTE — Progress Notes (Signed)
Darlene Ochoa,  Normal CBC.  Kidney, liver, glucose look great.  HDL, good cholesterol, is great.  LDL, bad cholesterol, is not to goal by numbers but in overall 10 year risk calculator your risk is 1.4 percent which is still pretty low risk of CV event. We could start low dose statin due to age or just continue to monitor yearly. Thoughts?  Thyroid is normal.

## 2020-09-19 DIAGNOSIS — F411 Generalized anxiety disorder: Secondary | ICD-10-CM | POA: Diagnosis not present

## 2020-09-25 NOTE — Telephone Encounter (Signed)
Ins will cover #90 until after pt has titrated up, then a PA will be needed for the max dose of 3,600mg  daily.

## 2020-09-28 DIAGNOSIS — F411 Generalized anxiety disorder: Secondary | ICD-10-CM | POA: Diagnosis not present

## 2020-09-28 DIAGNOSIS — F3342 Major depressive disorder, recurrent, in full remission: Secondary | ICD-10-CM | POA: Diagnosis not present

## 2020-10-01 ENCOUNTER — Emergency Department (INDEPENDENT_AMBULATORY_CARE_PROVIDER_SITE_OTHER): Payer: BC Managed Care – PPO

## 2020-10-01 ENCOUNTER — Emergency Department
Admission: RE | Admit: 2020-10-01 | Discharge: 2020-10-01 | Disposition: A | Discharge status: Home / Self Care | Payer: BC Managed Care – PPO | Source: Ambulatory Visit

## 2020-10-01 ENCOUNTER — Other Ambulatory Visit: Payer: Self-pay

## 2020-10-01 VITALS — BP 145/82 | HR 82 | Temp 98.7°F | Resp 18 | Ht 65.0 in | Wt 140.0 lb

## 2020-10-01 DIAGNOSIS — M25571 Pain in right ankle and joints of right foot: Secondary | ICD-10-CM

## 2020-10-01 DIAGNOSIS — S99911A Unspecified injury of right ankle, initial encounter: Secondary | ICD-10-CM

## 2020-10-01 DIAGNOSIS — M7731 Calcaneal spur, right foot: Secondary | ICD-10-CM | POA: Diagnosis not present

## 2020-10-01 NOTE — ED Triage Notes (Signed)
Rt ankle injury yesterday, a leaf blower fell on a shelf and hit my ankle

## 2020-10-01 NOTE — ED Provider Notes (Signed)
[facility]   [id number] 10/01/20 Arrival Time: 1824  MS:[id number] PAIN  SUBJECTIVE: History from: patient. Darlene Ochoa is a 55 y.o. female complains of right ankle pain that began last night after a leaf blower fell over on her ankle. Localizes the pain to the right lateral malleolus. Describes the pain as constant and achy in character with intermittent sharp pain. Has tried OTC medications without relief. Symptoms are made worse with activity. Denies similar symptoms in the past.  Denies fever, chills, erythema, weakness, numbness and tingling, saddle paresthesias, loss of bowel or bladder function.      ROS: As per HPI.  All other pertinent ROS negative.     Past Medical History:  Diagnosis Date   Anxiety    Chest pain 03/31/2012   Depression    H/O prolonged Q-T interval on ECG 03/31/2012   Hypertension    Major depressive disorder 03/31/2012   Seasonal allergies    Past Surgical History:  Procedure Laterality Date   CESAREAN SECTION  1998   ROTATOR CUFF REPAIR Right 2021   Allergies  Allergen Reactions   Cefuroxime Swelling   Lac Bovis Nausea And Vomiting   Peanut Oil Other (See Comments)   Pork Allergy Nausea And Vomiting   Doxycycline Nausea And Vomiting   Lactose Intolerance (Gi)    Peanut-Containing Drug Products    Trazodone And Nefazodone     Prolonged QTC   Codeine Rash   Penicillins Rash   Sulfonamide Derivatives Rash   No current facility-administered medications on file prior to encounter.   Current Outpatient Medications on File Prior to Encounter  Medication Sig Dispense Refill   buPROPion (WELLBUTRIN XL) 150 MG 24 hr tablet Take 150 mg by mouth daily.     buPROPion (WELLBUTRIN XL) 300 MG 24 hr tablet Take 300 mg by mouth daily.     cholecalciferol (VITAMIN D3) 25 MCG (1000 UNIT) tablet Take 1,000 Units by mouth daily.     diclofenac Sodium (VOLTAREN) 1 % GEL APPLY 4 G TOPICALLY 4 (FOUR) TIMES DAILY. TO AFFECTED JOINT. 100 g 1   FLUoxetine  (PROZAC) 20 MG capsule Take 60 mg by mouth every morning.     hydrochlorothiazide (HYDRODIURIL) 25 MG tablet TAKE 1 TABLET DAILY. 90 tablet 1   loratadine (CLARITIN) 10 MG tablet Take by mouth.     meloxicam (MOBIC) 15 MG tablet Take 1 tablet (15 mg total) by mouth daily. 90 tablet 1   QUEtiapine (SEROQUEL) 25 MG tablet Take 25 mg by mouth at bedtime.     SUMAtriptan (IMITREX) 50 MG tablet Take 1 tablet (50 mg total) by mouth every 2 (two) hours as needed for migraine. May repeat in 2 hours if headache persists or recurs. (Patient not taking: No sig reported) 12 tablet 0   Social History   Socioeconomic History   Marital status: Married    Spouse name: Not on file   Number of children: Not on file   Years of education: Not on file   Highest education level: Not on file  Occupational History   Not on file  Tobacco Use   Smoking status: Never   Smokeless tobacco: Never  Vaping Use   Vaping Use: Never used  Substance and Sexual Activity   Alcohol use: No   Drug use: No   Sexual activity: Yes    Partners: Male  Other Topics Concern   Not on file  Social History Narrative   Not on file   Social Determinants  of Health   Financial Resource Strain: Not on file  Food Insecurity: Not on file  Transportation Needs: Not on file  Physical Activity: Not on file  Stress: Not on file  Social Connections: Not on file  Intimate Partner Violence: Not on file   Family History  Problem Relation Age of Onset   Diabetes Mother    Cancer Mother        Breast   Depression Mother    CAD Mother    Heart failure Father    Stroke Father    Alcohol abuse Father    Alcohol abuse Brother    Drug abuse Brother     OBJECTIVE:  Vitals:   10/01/20 1839 10/01/20 1840  BP: (!) 145/82   Pulse: 82   Resp: 18   Temp: 98.7 F (37.1 C)   TempSrc: Oral   SpO2: 99%   Weight:  140 lb (63.5 kg)  Height:  5\' 5"  (1.651 m)    General appearance: ALERT; in no acute distress.  Head: NCAT Lungs:  Normal respiratory effort CV: pulses 2+ bilaterally. Cap refill < 2 seconds Musculoskeletal:  Inspection: Skin warm, dry, clear and intact Erythema, effusion to right lateral malleolus Palpation: right lateral malleolus tender to palpation ROM: Limited ROM active and passive to right ankle Skin: warm and dry Neurologic: Ambulates without difficulty; Sensation intact about the upper/ lower extremities Psychological: alert and cooperative; normal mood and affect  DIAGNOSTIC STUDIES:  DG Ankle Complete Right  Result Date: 10/01/2020 CLINICAL DATA:  Leg injury EXAM: RIGHT ANKLE - COMPLETE 3+ VIEW COMPARISON:  06/06/2011 FINDINGS: There is no evidence of fracture, dislocation, or joint effusion. Mild degenerative spurring medially and laterally. Small plantar calcaneal spur. Soft tissues are unremarkable. IMPRESSION: No acute osseous abnormality Electronically Signed   By: 06/08/2011 M.D.   On: 10/01/2020 19:02     ASSESSMENT & PLAN:  1. Acute right ankle pain    Xray today is negative for fracture or misalignment Continue conservative management of rest, ice, and gentle stretches Take ibuprofen as needed for pain relief (may cause abdominal discomfort, ulcers, and GI bleeds avoid taking with other NSAIDs) Use rest, ice, compression, elevation to the area Follow up with PCP if symptoms persist Return or go to the ER if you have any new or worsening symptoms (fever, chills, chest pain, abdominal pain, changes in bowel or bladder habits, pain radiating into lower legs)  Reviewed expectations re: course of current medical issues. Questions answered. Outlined signs and symptoms indicating need for more acute intervention. Patient verbalized understanding. After Visit Summary given.        10/03/2020, NP 10/01/20 1938

## 2020-10-01 NOTE — Discharge Instructions (Addendum)
Xray today is negative for fracture or misalignment  May take 800 mg ibuprofen with 1000 mg of Tylenol.  Do not exceed 4000 mg of Tylenol in 24 hours.  May use ice to the area  Follow up with this office or with primary care if symptoms are persisting.  Follow up in the ER for high fever, trouble swallowing, trouble breathing, other concerning symptoms.

## 2020-10-03 DIAGNOSIS — F411 Generalized anxiety disorder: Secondary | ICD-10-CM | POA: Diagnosis not present

## 2020-10-12 ENCOUNTER — Other Ambulatory Visit: Payer: Self-pay

## 2020-10-12 ENCOUNTER — Ambulatory Visit: Payer: BC Managed Care – PPO | Admitting: Medical-Surgical

## 2020-10-12 ENCOUNTER — Encounter: Payer: Self-pay | Admitting: Medical-Surgical

## 2020-10-12 VITALS — BP 118/72 | HR 73 | Temp 99.1°F | Ht 65.0 in | Wt 143.5 lb

## 2020-10-12 DIAGNOSIS — S99911D Unspecified injury of right ankle, subsequent encounter: Secondary | ICD-10-CM | POA: Diagnosis not present

## 2020-10-12 MED ORDER — HYDROCODONE-ACETAMINOPHEN 5-325 MG PO TABS: 1.0000 | ORAL_TABLET | Freq: Three times a day (TID) | ORAL | 0 refills | Status: AC | PRN

## 2020-10-12 NOTE — Progress Notes (Signed)
Subjective:    CC: Right ankle injury  HPI: Pleasant 55 year old female presenting today for reevaluation of her right ankle injury.  She was seen in urgent care on 6/13 after her ankle was injured by a leaf blower that fell from a shelf approximately 8 feet above the ground.  The leaf blower hit the floor and then bounced into her ankle hitting the right lateral malleolus.  She had an x-ray completed at urgent care with no fractures or alignment concerns.  She was prescribed rest, ice, elevation, and anti-inflammatories.  She has been taking meloxicam 15 mg daily and admits that she is also been taking Advil 800 mg every 8 hours.  She is having significant pain over the right lateral ankle and has noted quite a bit of swelling.  She notes that the swelling is better in the morning and gets worse as the day goes along.  She has pain with regular activities including ambulation and any movement of the ankle.  She is able to drive and has gone down to her garden several times but otherwise is limiting her weightbearing status on the foot.  Notes that her pain has not improved and was even worse last night than immediately after the injury.  I reviewed the past medical history, family history, social history, surgical history, and allergies today and no changes were needed.  Please see the problem list section below in epic for further details.  Past Medical History: Past Medical History:  Diagnosis Date   Anxiety    Chest pain 03/31/2012   Depression    H/O prolonged Q-T interval on ECG 03/31/2012   Hypertension    Major depressive disorder 03/31/2012   Seasonal allergies    Past Surgical History: Past Surgical History:  Procedure Laterality Date   CESAREAN SECTION  1998   ROTATOR CUFF REPAIR Right 2021   Social History: Social History   Socioeconomic History   Marital status: Married    Spouse name: Not on file   Number of children: Not on file   Years of education: Not on file    Highest education level: Not on file  Occupational History   Not on file  Tobacco Use   Smoking status: Never   Smokeless tobacco: Never  Vaping Use   Vaping Use: Never used  Substance and Sexual Activity   Alcohol use: No   Drug use: No   Sexual activity: Yes    Partners: Male  Other Topics Concern   Not on file  Social History Narrative   Not on file   Social Determinants of Health   Financial Resource Strain: Not on file  Food Insecurity: Not on file  Transportation Needs: Not on file  Physical Activity: Not on file  Stress: Not on file  Social Connections: Not on file   Family History: Family History  Problem Relation Age of Onset   Diabetes Mother    Cancer Mother        Breast   Depression Mother    CAD Mother    Heart failure Father    Stroke Father    Alcohol abuse Father    Alcohol abuse Brother    Drug abuse Brother    Allergies: Allergies  Allergen Reactions   Cefuroxime Swelling   Lac Bovis Nausea And Vomiting   Lactose Nausea And Vomiting   Peanut Oil Other (See Comments)   Peanut-Containing Drug Products Rash   Pork Allergy Nausea And Vomiting   Doxycycline Nausea And  Vomiting   Lactose Intolerance (Gi)    Sulfa Antibiotics     Other reaction(s): Other (See Comments)   Trazodone And Nefazodone     Prolonged QTC   Codeine Rash   Penicillins Rash   Sulfonamide Derivatives Rash   Medications: See med rec.  Review of Systems: See HPI for pertinent positives and negatives.   Objective:    General: Well Developed, well nourished, and in no acute distress.  Neuro: Alert and oriented x3  HEENT: Normocephalic, atraumatic.  Skin: Warm and dry. Cardiac: Regular rate and rhythm, no murmurs rubs or gallops, no lower extremity edema.  Respiratory: Clear to auscultation bilaterally. Not using accessory muscles, speaking in full sentences. Right ankle/foot: Range of motion limited by pain.  Pain with movement in all planes and tenderness in the  areas of swelling surrounding the lateral ankle and the lateral midfoot.  No significant erythema or bruising.  Able to bear weight.  Pulses 2+ with normal cap refill.  Sensation intact.  Impression and Recommendations:    1. Injury of right ankle, subsequent encounter X-rays independently reviewed with no signs of malalignment or fracture.  Swelling at the lateral malleolus and tenderness along the foot are expected at this point.  She is 1.5 weeks out from the initial injury.  Recommend continuing ice, compression, elevation, and meloxicam.  Sending in a small quantity of hydrocodone for use at night when her pain level rises.  Once pain and swelling start to subside, recommend doing ankle exercises such as tracing the alphabet in the air and gentle range of motion.  She does have a cam boot at home that she used for previous right Achilles tendinitis.  If this is more comfortable than an Ace wrap or other ankle brace, okay to use.  For support today, right foot and ankle wrapped in an Ace wrap.  Patient was able to ambulate to check out with a mild limp favoring the right foot.  Return for Further evaluation and management with Dr. Karie Schwalbe if no improvement in symptoms over the next 3-4 weeks.. ___________________________________________ Thayer Ohm, DNP, APRN, FNP-BC Primary Care and Sports Medicine [facility] [place]

## 2020-10-24 DIAGNOSIS — F411 Generalized anxiety disorder: Secondary | ICD-10-CM | POA: Diagnosis not present

## 2020-11-02 ENCOUNTER — Ambulatory Visit: Payer: BC Managed Care – PPO | Admitting: Medical-Surgical

## 2020-11-02 ENCOUNTER — Encounter: Payer: Self-pay | Admitting: Medical-Surgical

## 2020-11-02 VITALS — BP 129/83 | HR 76 | Ht 65.0 in | Wt 144.0 lb

## 2020-11-02 DIAGNOSIS — S99911D Unspecified injury of right ankle, subsequent encounter: Secondary | ICD-10-CM

## 2020-11-02 NOTE — Progress Notes (Signed)
Subjective:    CC: Continued right ankle pain and swelling  HPI: Pleasant 55 year old female presenting today for evaluation of her right ankle.  She was seen on 10/12/2020 for an injury of her right ankle.  At that time, we did x-rays with no fracture identified.  She was treated conservatively with home physical therapy exercises and meloxicam.  Since then, she notes that her symptoms did improve some but she continues to have swelling to the lateral right ankle.  She has been wearing a walking boot when out and about.  Noted that yesterday she did not wear the boot and was doing okay but last night she stepped in a pothole and twisted the same ankle again.  She has been having a pain that she describes as a numbness in the lateral ankle and extending up the leg several inches.  She has done her physical therapy at home every day for both range of motion and stretching.  Notes that when she walks, her ankle does feel unstable.  Continues to take meloxicam and Tylenol as needed for pain.  I reviewed the past medical history, family history, social history, surgical history, and allergies today and no changes were needed.  Please see the problem list section below in epic for further details.  Past Medical History: Past Medical History:  Diagnosis Date   Anxiety    Chest pain 03/31/2012   Depression    H/O prolonged Q-T interval on ECG 03/31/2012   Hypertension    Major depressive disorder 03/31/2012   Seasonal allergies    Past Surgical History: Past Surgical History:  Procedure Laterality Date   CESAREAN SECTION  1998   ROTATOR CUFF REPAIR Right 2021   Social History: Social History   Socioeconomic History   Marital status: Married    Spouse name: Not on file   Number of children: Not on file   Years of education: Not on file   Highest education level: Not on file  Occupational History   Not on file  Tobacco Use   Smoking status: Never   Smokeless tobacco: Never  Vaping Use    Vaping Use: Never used  Substance and Sexual Activity   Alcohol use: No   Drug use: No   Sexual activity: Yes    Partners: Male  Other Topics Concern   Not on file  Social History Narrative   Not on file   Social Determinants of Health   Financial Resource Strain: Not on file  Food Insecurity: Not on file  Transportation Needs: Not on file  Physical Activity: Not on file  Stress: Not on file  Social Connections: Not on file   Family History: Family History  Problem Relation Age of Onset   Diabetes Mother    Cancer Mother        Breast   Depression Mother    CAD Mother    Heart failure Father    Stroke Father    Alcohol abuse Father    Alcohol abuse Brother    Drug abuse Brother    Allergies: Allergies  Allergen Reactions   Cefuroxime Swelling   Lac Bovis Nausea And Vomiting   Lactose Nausea And Vomiting   Peanut Oil Other (See Comments)   Peanut-Containing Drug Products Rash   Pork Allergy Nausea And Vomiting   Doxycycline Nausea And Vomiting   Lactose Intolerance (Gi)    Sulfa Antibiotics     Other reaction(s): Other (See Comments)   Trazodone And Nefazodone  Prolonged QTC   Codeine Rash   Penicillins Rash   Sulfonamide Derivatives Rash   Medications: See med rec.  Review of Systems: See HPI for pertinent positives and negatives.   Objective:    General: Well Developed, well nourished, and in no acute distress.  Neuro: Alert and oriented x3.  HEENT: Normocephalic, atraumatic.  Skin: Warm and dry. Cardiac: Regular rate and rhythm, no murmurs rubs or gallops, no lower extremity edema.  Respiratory: Clear to auscultation bilaterally. Not using accessory muscles, speaking in full sentences. RLE: Range of motion intact to ankle and foot although plantarflexion of the foot and pronation of the right ankle are uncomfortable.  Tenderness along the superior aspect of the lateral malleolus extending up the leg approximately 3 to 4 inches.  No significant  swelling, erythema, or bruising.  Impression and Recommendations:    1. Injury of right ankle, subsequent encounter Subjective instability to the ankle with continued discomfort/pain.  For now, continue meloxicam daily and use Tylenol as needed.  Reviewed other conservative measures for pain management including massage, lidocaine patches, over-the-counter topical therapies, Voltaren gel, heat, ice, and stretching.  She would like to avoid formal PT at this time so advised her to continue doing her home exercises.  Suspect she has some soft tissue injuries present that were exacerbated by stepping in a hole yesterday.  Ordering MRI of the right ankle for further investigation.  Advised patient that her insurance may not okay the procedure until she has failed at least 6 weeks of conservative care. - MR ANKLE RIGHT WO CONTRAST; Future  Return if symptoms worsen or fail to improve.  ___________________________________________ Thayer Ohm, DNP, APRN, FNP-BC Primary Care and Sports Medicine [facility] [place]

## 2020-11-15 ENCOUNTER — Other Ambulatory Visit: Payer: Self-pay

## 2020-11-15 ENCOUNTER — Emergency Department
Admission: EM | Admit: 2020-11-15 | Discharge: 2020-11-15 | Disposition: A | Discharge status: Home / Self Care | Payer: BC Managed Care – PPO | Source: Home / Self Care | Attending: Family Medicine | Admitting: Family Medicine

## 2020-11-15 DIAGNOSIS — Z20822 Contact with and (suspected) exposure to covid-19: Secondary | ICD-10-CM

## 2020-11-15 DIAGNOSIS — R002 Palpitations: Secondary | ICD-10-CM

## 2020-11-15 NOTE — ED Provider Notes (Signed)
[facility]    CSN: [id number] Arrival date & time: 11/15/20  1039      History   Chief Complaint Chief Complaint  Patient presents with   Chest Pain    TIGHTNESS   Chills   Covid Exposure    HPI Darlene Ochoa is a 55 y.o. female.   HPI Patient states that her son came to visit last weekend.  She subsequently found out that his family had COVID-19 and that she was exposed.  She states that she woke in the middle of the night around 3 AM with a rapid heart beat and some tight sensation in her chest.  She thought it might be anxiety although this does not usually awaken her in the middle of the night.  Subsequently developed fever and chills, and aches.  Feels tired.  Patient is not COVID vaccinated.  Patient has well-controlled hypertension on hydrochlorothiazide  Patient states she has had long-term treatment for anxiety and depression.  Currently stable Past Medical History:  Diagnosis Date   Anxiety    Chest pain 03/31/2012   Depression    H/O prolonged Q-T interval on ECG 03/31/2012   Hypertension    Major depressive disorder 03/31/2012   Seasonal allergies     Patient Active Problem List   Diagnosis Date Noted   Elevated LDL cholesterol level 09/11/2020   Right Achilles tendinitis 08/22/2020   Right lumbar radiculitis 04/04/2020   Impingement syndrome of right shoulder 03/28/2019   Primary osteoarthritis of both first carpometacarpal joints 07/29/2018   Tendonitis of both wrists 06/01/2018   Numbness and tingling in both hands 06/01/2018   Angio-edema 08/10/2017   Environmental allergies 07/21/2017   Seasonal allergic rhinitis 07/09/2017   Post-nasal drip 07/09/2017   Vitamin D deficiency 08/01/2014   B12 deficiency 08/01/2014   Prolonged Q-T interval on ECG 10/06/2013   Generalized anxiety disorder 12/27/2012   Anxiety 04/27/2012   Essential hypertension 04/27/2012   Major depressive disorder 03/31/2012   Migraine 03/31/2012    Past Surgical  History:  Procedure Laterality Date   CESAREAN SECTION  1998   ROTATOR CUFF REPAIR Right 2021    OB History   No obstetric history on file.      Home Medications    Prior to Admission medications   Medication Sig Start Date End Date Taking? Authorizing Provider  buPROPion (WELLBUTRIN XL) 150 MG 24 hr tablet Take 150 mg by mouth daily. 11/25/16   [provider]  buPROPion (WELLBUTRIN XL) 300 MG 24 hr tablet Take 300 mg by mouth daily. 10/28/16   [provider]  cholecalciferol (VITAMIN D3) 25 MCG (1000 UNIT) tablet Take 1,000 Units by mouth daily.    [provider]  diclofenac Sodium (VOLTAREN) 1 % GEL APPLY 4 G TOPICALLY 4 (FOUR) TIMES DAILY. TO AFFECTED JOINT. 09/27/19   Tandy Gaw L, PA-C  FLUoxetine (PROZAC) 20 MG capsule Take 60 mg by mouth every morning. 02/23/20   [provider]  hydrochlorothiazide (HYDRODIURIL) 25 MG tablet TAKE 1 TABLET DAILY. 08/22/20   Breeback, Jade L, PA-C  loratadine (CLARITIN) 10 MG tablet Take by mouth.    [provider]  meloxicam (MOBIC) 15 MG tablet Take 1 tablet (15 mg total) by mouth daily. 08/20/20   Breeback, Jade L, PA-C  QUEtiapine (SEROQUEL) 25 MG tablet Take 25 mg by mouth at bedtime. 03/24/20   [provider]  SUMAtriptan (IMITREX) 50 MG tablet Take 1 tablet (50 mg total) by mouth every 2 (  two) hours as needed for migraine. May repeat in 2 hours if headache persists or recurs. 12/04/16   Carlis Stable, PA-C    Family History Family History  Problem Relation Age of Onset   Diabetes Mother    Cancer Mother        Breast   Depression Mother    CAD Mother    Heart failure Father    Stroke Father    Alcohol abuse Father    Alcohol abuse Brother    Drug abuse Brother     Social History Social History   Tobacco Use   Smoking status: Never   Smokeless tobacco: Never  Vaping Use   Vaping Use: Never used  Substance Use Topics   Alcohol use: No   Drug use: No      Allergies   Cefuroxime, [place] bovis, Lactose, Peanut oil, Peanut-containing drug products, Pork allergy, Doxycycline, Lactose intolerance (gi), Sulfa antibiotics, Trazodone and nefazodone, Codeine, Penicillins, and Sulfonamide derivatives   Review of Systems Review of Systems  See HPI Physical Exam Triage Vital Signs ED Triage Vitals  Enc Vitals Group     BP 11/15/20 1049 115/74     Pulse Rate 11/15/20 1049 95     Resp 11/15/20 1049 18     Temp 11/15/20 1049 99.6 F (37.6 C)     Temp Source 11/15/20 1049 Oral     SpO2 11/15/20 1049 99 %     Weight --      Height --      Head Circumference --      Peak Flow --      Pain Score 11/15/20 1050 0     Pain Loc --      Pain Edu? --      Excl. in GC? --    No data found.  Updated Vital Signs BP 115/74 (BP Location: Right Arm)   Pulse 95   Temp 99.6 F (37.6 C) (Oral)   Resp 18   LMP 06/18/2017 (Approximate)   SpO2 99%      Physical Exam Constitutional:      General: She is not in acute distress.    Appearance: She is well-developed and normal weight.     Comments: Appears lean and athletic.  No acute distress  HENT:     Head: Normocephalic and atraumatic.     Right Ear: Tympanic membrane and ear canal normal.     Left Ear: Tympanic membrane and ear canal normal.     Nose: No congestion or rhinorrhea.     Mouth/Throat:     Comments: Mouth is dry.  No erythema throat.  No sore throat Eyes:     Conjunctiva/sclera: Conjunctivae normal.     Pupils: Pupils are equal, round, and reactive to light.  Cardiovascular:     Rate and Rhythm: Normal rate and regular rhythm.     Heart sounds: Normal heart sounds.  Pulmonary:     Effort: Pulmonary effort is normal. No respiratory distress.     Breath sounds: Normal breath sounds. No wheezing or rales.  Chest:     Chest wall: No tenderness.  Abdominal:     General: There is no distension.     Palpations: Abdomen is soft.  Musculoskeletal:        General: Normal range of  motion.     Cervical back: Normal range of motion.  Skin:    General: Skin is warm and dry.  Neurological:     General:  No focal deficit present.     Mental Status: She is alert.     Gait: Gait normal.  Psychiatric:        Mood and Affect: Mood normal.        Behavior: Behavior normal.     UC Treatments / Results  Labs (all labs ordered are listed, but only abnormal results are displayed) Labs Reviewed  SARS-COV-2 RNA,(COVID-19) QUALITATIVE NAAT    EKG-normal sinus rhythm.  Normal rate.  Normal intervals with no QT prolongation.  Nonspecific ST-T wave changes that are unchanged from prior   Radiology No results found.  Procedures Procedures (including critical care time)  Medications Ordered in UC Medications - No data to display  Initial Impression / Assessment and Plan / UC Course  I have reviewed the triage vital signs and the nursing notes.  Pertinent labs & imaging results that were available during my care of the patient were reviewed by me and considered in my medical decision making (see chart for details).     I feels likely patient has COVID.  With COVID exposure, fever body aches and rapid heart rate, her symptoms are consistent.  We will do COVID testing and have her follow-up as needed. Patient is reassured that her EKG is unchanged.  Palpitations are not unusual with COVID. Final Clinical Impressions(s) / UC Diagnoses   Final diagnoses:  Close exposure to COVID-19 virus  Palpitations     Discharge Instructions      Home to rest Take Tylenol for any pain or fever Call for questions or problems Check MyChart for test results     ED Prescriptions   None    PDMP not reviewed this encounter.   Eustace Moore, MD 11/15/20 [phone]

## 2020-11-15 NOTE — ED Triage Notes (Signed)
Pt c/o chest tightness, chills and bodyaches since 3 am this morning. COVID exposure on Sunday. Has not had covid vaccines. Tylenol prn. Last dose 945am.

## 2020-11-15 NOTE — Discharge Instructions (Addendum)
Home to rest Take Tylenol for any pain or fever Call for questions or problems Check MyChart for test results

## 2020-11-16 ENCOUNTER — Telehealth: Payer: BC Managed Care – PPO | Admitting: Medical-Surgical

## 2020-11-16 LAB — SARS-COV-2 RNA,(COVID-19) QUALITATIVE NAAT: SARS CoV2 RNA: DETECTED — AB

## 2020-12-05 DIAGNOSIS — F411 Generalized anxiety disorder: Secondary | ICD-10-CM | POA: Diagnosis not present

## 2020-12-19 DIAGNOSIS — F411 Generalized anxiety disorder: Secondary | ICD-10-CM | POA: Diagnosis not present

## 2021-01-16 DIAGNOSIS — F411 Generalized anxiety disorder: Secondary | ICD-10-CM | POA: Diagnosis not present

## 2021-02-02 ENCOUNTER — Other Ambulatory Visit: Payer: Self-pay | Admitting: Physician Assistant

## 2021-02-02 DIAGNOSIS — M7661 Achilles tendinitis, right leg: Secondary | ICD-10-CM

## 2021-02-02 DIAGNOSIS — R200 Anesthesia of skin: Secondary | ICD-10-CM

## 2021-02-02 DIAGNOSIS — M180 Bilateral primary osteoarthritis of first carpometacarpal joints: Secondary | ICD-10-CM

## 2021-02-02 DIAGNOSIS — I10 Essential (primary) hypertension: Secondary | ICD-10-CM

## 2021-02-02 DIAGNOSIS — M5416 Radiculopathy, lumbar region: Secondary | ICD-10-CM

## 2021-02-06 DIAGNOSIS — F411 Generalized anxiety disorder: Secondary | ICD-10-CM | POA: Diagnosis not present

## 2021-02-20 DIAGNOSIS — F411 Generalized anxiety disorder: Secondary | ICD-10-CM | POA: Diagnosis not present

## 2021-03-20 DIAGNOSIS — F411 Generalized anxiety disorder: Secondary | ICD-10-CM | POA: Diagnosis not present

## 2021-04-24 DIAGNOSIS — F411 Generalized anxiety disorder: Secondary | ICD-10-CM | POA: Diagnosis not present

## 2021-04-26 ENCOUNTER — Telehealth (INDEPENDENT_AMBULATORY_CARE_PROVIDER_SITE_OTHER): Payer: BC Managed Care – PPO | Admitting: Physician Assistant

## 2021-04-26 ENCOUNTER — Encounter: Payer: Self-pay | Admitting: Physician Assistant

## 2021-04-26 VITALS — Ht 65.0 in | Wt 144.0 lb

## 2021-04-26 DIAGNOSIS — J329 Chronic sinusitis, unspecified: Secondary | ICD-10-CM | POA: Diagnosis not present

## 2021-04-26 DIAGNOSIS — J40 Bronchitis, not specified as acute or chronic: Secondary | ICD-10-CM

## 2021-04-26 MED ORDER — PREDNISONE 50 MG PO TABS: ORAL_TABLET | ORAL | 0 refills | Status: DC

## 2021-04-26 MED ORDER — CEFDINIR 300 MG PO CAPS: 300.0000 mg | ORAL_CAPSULE | Freq: Two times a day (BID) | ORAL | 0 refills | Status: DC

## 2021-04-26 MED ORDER — BENZONATATE 200 MG PO CAPS: 200.0000 mg | ORAL_CAPSULE | Freq: Three times a day (TID) | ORAL | 0 refills | Status: DC | PRN

## 2021-04-26 NOTE — Patient Instructions (Signed)
Acute Bronchitis, Adult °Acute bronchitis is sudden inflammation of the main airways (bronchi) that come off the windpipe (trachea) in the lungs. The swelling causes the airways to get smaller and make more mucus than normal. This can make it hard to breathe and can cause coughing or noisy breathing (wheezing). °Acute bronchitis may last several weeks. The cough may last longer. Allergies, asthma, and exposure to smoke may make the condition worse. °What are the causes? °This condition can be caused by germs and by substances that irritate the lungs, including: °Cold and flu viruses. The most common cause of this condition is the virus that causes the common cold. °Bacteria. This is less common. °Breathing in substances that irritate the lungs, including: °Smoke from cigarettes and other forms of tobacco. °Dust and pollen. °Fumes from household cleaning products, gases, or burned fuel. °Indoor or outdoor air pollution. °What increases the risk? °The following factors may make you more likely to develop this condition: °A weak body's defense system, also called the immune system. °A condition that affects your lungs and breathing, such as asthma. °What are the signs or symptoms? °Common symptoms of this condition include: °Coughing. This may bring up clear, yellow, or green mucus from your lungs (sputum). °Wheezing. °Runny or stuffy nose. °Having too much mucus in your lungs (chest congestion). °Shortness of breath. °Aches and pains, including sore throat or chest. °How is this diagnosed? °This condition is usually diagnosed based on: °Your symptoms and medical history. °A physical exam. °You may also have other tests, including tests to rule out other conditions, such as pneumonia. These tests include: °A test of lung function. °Test of a mucus sample to look for the presence of bacteria. °Tests to check the oxygen level in your blood. °Blood tests. °Chest X-ray. °How is this treated? °Most cases of acute bronchitis  clear up over time without treatment. Your health care provider may recommend: °Drinking more fluids to help thin your mucus so it is easier to cough up. °Taking inhaled medicine (inhaler) to improve air flow in and out of your lungs. °Using a vaporizer or a humidifier. These are machines that add water to the air to help you breathe better. °Taking a medicine that thins mucus and clears congestion (expectorant). °Taking a medicine that prevents or stops coughing (cough suppressant). °It is notcommon to take an antibiotic medicine for this condition. °Follow these instructions at home: ° °Take over-the-counter and prescription medicines only as told by your health care provider. °Use an inhaler, vaporizer, or humidifier as told by your health care provider. °Take two teaspoons (10 mL) of honey at bedtime to lessen coughing at night. °Drink enough fluid to keep your urine pale yellow. °Do not use any products that contain nicotine or tobacco. These products include cigarettes, chewing tobacco, and vaping devices, such as e-cigarettes. If you need help quitting, ask your health care provider. °Get plenty of rest. °Return to your normal activities as told by your health care provider. Ask your health care provider what activities are safe for you. °Keep all follow-up visits. This is important. °How is this prevented? °To lower your risk of getting this condition again: °Wash your hands often with soap and water for at least 20 seconds. If soap and water are not available, use hand sanitizer. °Avoid contact with people who have cold symptoms. °Try not to touch your mouth, nose, or eyes with your hands. °Avoid breathing in smoke or chemical fumes. Breathing smoke or chemical fumes will make your condition   worse. °Get the flu shot every year. °Contact a health care provider if: °Your symptoms do not improve after 2 weeks. °You have trouble coughing up the mucus. °Your cough keeps you awake at night. °You have a  fever. °Get help right away if you: °Cough up blood. °Feel pain in your chest. °Have severe shortness of breath. °Faint or keep feeling like you are going to faint. °Have a severe headache. °Have a fever or chills that get worse. °These symptoms may represent a serious problem that is an emergency. Do not wait to see if the symptoms will go away. Get medical help right away. Call your local emergency services (911 in the [place] Do not drive yourself to the hospital. °Summary °Acute bronchitis is inflammation of the main airways (bronchi) that come off the windpipe (trachea) in the lungs. The swelling causes the airways to get smaller and make more mucus than normal. °Drinking more fluids can help thin your mucus so it is easier to cough up. °Take over-the-counter and prescription medicines only as told by your health care provider. °Do not use any products that contain nicotine or tobacco. These products include cigarettes, chewing tobacco, and vaping devices, such as e-cigarettes. If you need help quitting, ask your health care provider. °Contact a health care provider if your symptoms do not improve after 2 weeks. °This information is not intended to replace advice given to you by your health care provider. Make sure you discuss any questions you have with your health care provider. °Document Revised: 08/08/2020 Document Reviewed: 08/08/2020 °Elsevier Patient Education © 2022 [employer] ° °

## 2021-04-26 NOTE — Progress Notes (Addendum)
..Virtual Visit via Telephone Note  I connected with Darlene Ochoa on 04/26/21 at 10:30 AM EST by telephone and verified that I am speaking with the correct person using two identifiers.  Location: Patient: home Provider: clinic  .Marland KitchenParticipating in visit:  Patient: Darlene Ochoa Provider: Tandy Gaw PA-C Provider in training: Shawna Orleans PA-S   I discussed the limitations, risks, security and privacy concerns of performing an evaluation and management service by telephone and the availability of in person appointments. I also discussed with the patient that there may be a patient responsible charge related to this service. The patient expressed understanding and agreed to proceed.   History of Present Illness: Pt is a 56 yo female with allergies who calls into the clinic with 1 week of cough, congestion, ST, chest tightness. She has tried OTC allergy medication and coriciden with tylenol. Tested negative for covid. Has not had covid vaccine. No significant trouble breathing.   .. Active Ambulatory Problems    Diagnosis Date Noted   Major depressive disorder 03/31/2012   Migraine 03/31/2012   Anxiety 04/27/2012   Essential hypertension 04/27/2012   Generalized anxiety disorder 12/27/2012   Prolonged Q-T interval on ECG 10/06/2013   Vitamin D deficiency 08/01/2014   B12 deficiency 08/01/2014   Seasonal allergic rhinitis 07/09/2017   Post-nasal drip 07/09/2017   Environmental allergies 07/21/2017   Angio-edema 08/10/2017   Tendonitis of both wrists 06/01/2018   Numbness and tingling in both hands 06/01/2018   Primary osteoarthritis of both first carpometacarpal joints 07/29/2018   Impingement syndrome of right shoulder 03/28/2019   Right lumbar radiculitis 04/04/2020   Right Achilles tendinitis 08/22/2020   Elevated LDL cholesterol level 09/11/2020   Resolved Ambulatory Problems    Diagnosis Date Noted   Palpitations 08/02/2009   Chest pain 03/31/2012   Shortness of breath  03/31/2012   Major depressive disorder, recurrent episode, moderate (HCC) 12/27/2012   Trapezius muscle spasm 08/21/2016   Acute pain of left shoulder 08/21/2016   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    Assessment and Plan: Marland KitchenRhanda was seen today for cough.  Diagnoses and all orders for this visit:  Sinobronchitis -     cefdinir (OMNICEF) 300 MG capsule; Take 1 capsule (300 mg total) by mouth 2 (two) times daily. For 7 days. -     benzonatate (TESSALON) 200 MG capsule; Take 1 capsule (200 mg total) by mouth 3 (three) times daily as needed for cough. -     predniSONE (DELTASONE) 50 MG tablet; One tab PO daily for 5 days.   Pt has QT prolongation with intolerance to doxy, PCN, sulfa. Sent omnicef. Pt has tolerated before.  Tessalon for cough Prednisone for cough/chest tightness Rest and hydrate. Follow up as needed.     Follow Up Instructions:    I discussed the assessment and treatment plan with the patient. The patient was provided an opportunity to ask questions and all were answered. The patient agreed with the  plan and demonstrated an understanding of the instructions.   The patient was advised to call back or seek an in-person evaluation if the symptoms worsen or if the condition fails to improve as anticipated.  I provided 12 minutes of non-face-to-face time during this encounter.   Tandy Gaw, PA-C

## 2021-04-26 NOTE — Progress Notes (Signed)
Symptoms started 1 week ago Aches in legs Cough - feels like she needs to cough up but can't Congestion  Sore throat Feels pressure in chest Was having sweats, but gone   Was taking tylenol Pharmacist told her to change Allergy medication from Claritin to Xyzal but didn't help  Covid test negative - 2 days ago

## 2021-05-03 ENCOUNTER — Ambulatory Visit: Payer: BC Managed Care – PPO | Admitting: Physician Assistant

## 2021-05-03 ENCOUNTER — Other Ambulatory Visit: Payer: Self-pay

## 2021-05-03 ENCOUNTER — Encounter: Payer: Self-pay | Admitting: Physician Assistant

## 2021-05-03 ENCOUNTER — Ambulatory Visit (INDEPENDENT_AMBULATORY_CARE_PROVIDER_SITE_OTHER): Payer: BC Managed Care – PPO

## 2021-05-03 VITALS — BP 111/75 | HR 80 | Temp 99.1°F | Ht 65.0 in | Wt 144.0 lb

## 2021-05-03 DIAGNOSIS — J209 Acute bronchitis, unspecified: Secondary | ICD-10-CM | POA: Diagnosis not present

## 2021-05-03 DIAGNOSIS — R059 Cough, unspecified: Secondary | ICD-10-CM | POA: Diagnosis not present

## 2021-05-03 DIAGNOSIS — R051 Acute cough: Secondary | ICD-10-CM

## 2021-05-03 MED ORDER — ALBUTEROL SULFATE HFA 108 (90 BASE) MCG/ACT IN AERS: 2.0000 | INHALATION_SPRAY | RESPIRATORY_TRACT | 0 refills | Status: AC | PRN

## 2021-05-03 MED ORDER — PREDNISONE 20 MG PO TABS: ORAL_TABLET | ORAL | 0 refills | Status: DC

## 2021-05-03 NOTE — Progress Notes (Signed)
Subjective:    Patient ID: Darlene Ochoa, female    DOB: 1965/11/16, 56 y.o.   MRN: [id number]  HPI Pt is a 56 yo female who presents to the clinic with productive cough. Her sinus symptoms have resolved but left with this deep cough. She was seen 04/26/2021 and givent ABx and prednisone. She does feel much better but her chest remains tight. No fever, chills, sinus pressure. She finished all prescription medication and now using OTC medication again. Pt does not smoke. No hx of lung disease.   .. Active Ambulatory Problems    Diagnosis Date Noted   Major depressive disorder 03/31/2012   Migraine 03/31/2012   Anxiety 04/27/2012   Essential hypertension 04/27/2012   Generalized anxiety disorder 12/27/2012   Prolonged Q-T interval on ECG 10/06/2013   Vitamin D deficiency 08/01/2014   B12 deficiency 08/01/2014   Seasonal allergic rhinitis 07/09/2017   Post-nasal drip 07/09/2017   Environmental allergies 07/21/2017   Angio-edema 08/10/2017   Tendonitis of both wrists 06/01/2018   Numbness and tingling in both hands 06/01/2018   Primary osteoarthritis of both first carpometacarpal joints 07/29/2018   Impingement syndrome of right shoulder 03/28/2019   Right lumbar radiculitis 04/04/2020   Right Achilles tendinitis 08/22/2020   Elevated LDL cholesterol level 09/11/2020   Resolved Ambulatory Problems    Diagnosis Date Noted   Palpitations 08/02/2009   Chest pain 03/31/2012   Shortness of breath 03/31/2012   Major depressive disorder, recurrent episode, moderate (HCC) 12/27/2012   Trapezius muscle spasm 08/21/2016   Acute pain of left shoulder 08/21/2016   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     Objective:   Physical Exam Vitals reviewed.  Constitutional:      Appearance: Normal appearance.  HENT:     Head: Normocephalic.     Right Ear: Tympanic membrane, ear canal and external ear normal. There is no impacted cerumen.     Left Ear: Tympanic membrane, ear canal and external ear normal. There is no impacted cerumen.     Nose: Nose normal.     Mouth/Throat:     Mouth: Mucous membranes are moist.  Eyes:     Conjunctiva/sclera: Conjunctivae normal.  Neck:     Vascular: No carotid bruit.  Cardiovascular:     Rate and Rhythm: Normal rate and regular rhythm.     Pulses: Normal pulses.     Heart sounds: Normal heart sounds. No murmur heard. Pulmonary:     Effort: Pulmonary effort is normal.     Breath sounds: Normal breath sounds. No wheezing or rhonchi.  Lymphadenopathy:     Cervical: No cervical adenopathy.  Neurological:     General: No focal deficit present.     Mental Status: She is oriented to person, place, and time.  Psychiatric:        Mood and Affect: Mood normal.          Assessment & Plan:  Marland KitchenMarland KitchenKattaleya was seen today for cough.  Diagnoses and all orders for this visit:  Acute bronchitis, unspecified organism  Acute cough -     DG Chest 2 View; Future  STAT  CXR showed bronchial thickening and no pneumonia.  Sent longer prednisone taper and encouraged to use albuterol inhaler every 4 hours as needed for cough.  Tessalon pearls as neeeded Reassurance given, follow up as needed or if symptoms persist.

## 2021-05-03 NOTE — Progress Notes (Signed)
No pneumonia. There is bronchial wall thickening consistent with bronchitis. Will send albuterol. I would like to hold on antibiotic for now. Let me know how you are doing on Monday.

## 2021-05-06 ENCOUNTER — Encounter: Payer: Self-pay | Admitting: Physician Assistant

## 2021-05-08 DIAGNOSIS — F411 Generalized anxiety disorder: Secondary | ICD-10-CM | POA: Diagnosis not present

## 2021-05-17 DIAGNOSIS — F411 Generalized anxiety disorder: Secondary | ICD-10-CM | POA: Diagnosis not present

## 2021-05-17 DIAGNOSIS — F3342 Major depressive disorder, recurrent, in full remission: Secondary | ICD-10-CM | POA: Diagnosis not present

## 2021-05-22 DIAGNOSIS — F411 Generalized anxiety disorder: Secondary | ICD-10-CM | POA: Diagnosis not present

## 2021-06-05 DIAGNOSIS — F411 Generalized anxiety disorder: Secondary | ICD-10-CM | POA: Diagnosis not present

## 2021-06-19 DIAGNOSIS — F411 Generalized anxiety disorder: Secondary | ICD-10-CM | POA: Diagnosis not present

## 2021-07-17 DIAGNOSIS — F411 Generalized anxiety disorder: Secondary | ICD-10-CM | POA: Diagnosis not present

## 2021-07-31 DIAGNOSIS — F410 Panic disorder [episodic paroxysmal anxiety] without agoraphobia: Secondary | ICD-10-CM | POA: Diagnosis not present

## 2021-07-31 DIAGNOSIS — F411 Generalized anxiety disorder: Secondary | ICD-10-CM | POA: Diagnosis not present

## 2021-08-02 ENCOUNTER — Other Ambulatory Visit: Payer: Self-pay | Admitting: Physician Assistant

## 2021-08-02 DIAGNOSIS — R200 Anesthesia of skin: Secondary | ICD-10-CM

## 2021-08-02 DIAGNOSIS — M180 Bilateral primary osteoarthritis of first carpometacarpal joints: Secondary | ICD-10-CM

## 2021-08-02 DIAGNOSIS — M5416 Radiculopathy, lumbar region: Secondary | ICD-10-CM

## 2021-08-02 DIAGNOSIS — M7661 Achilles tendinitis, right leg: Secondary | ICD-10-CM

## 2021-08-14 DIAGNOSIS — F411 Generalized anxiety disorder: Secondary | ICD-10-CM | POA: Diagnosis not present

## 2021-08-25 ENCOUNTER — Other Ambulatory Visit: Payer: Self-pay | Admitting: Physician Assistant

## 2021-08-25 DIAGNOSIS — I10 Essential (primary) hypertension: Secondary | ICD-10-CM

## 2021-09-04 DIAGNOSIS — F411 Generalized anxiety disorder: Secondary | ICD-10-CM | POA: Diagnosis not present

## 2021-09-19 ENCOUNTER — Other Ambulatory Visit: Payer: Self-pay | Admitting: Physician Assistant

## 2021-09-19 DIAGNOSIS — I10 Essential (primary) hypertension: Secondary | ICD-10-CM

## 2021-09-20 DIAGNOSIS — F5105 Insomnia due to other mental disorder: Secondary | ICD-10-CM | POA: Diagnosis not present

## 2021-09-20 DIAGNOSIS — F3342 Major depressive disorder, recurrent, in full remission: Secondary | ICD-10-CM | POA: Diagnosis not present

## 2021-09-20 DIAGNOSIS — F411 Generalized anxiety disorder: Secondary | ICD-10-CM | POA: Diagnosis not present

## 2021-09-24 ENCOUNTER — Emergency Department
Admission: EM | Admit: 2021-09-24 | Discharge: 2021-09-24 | Disposition: A | Discharge status: Home / Self Care | Payer: BC Managed Care – PPO | Source: Home / Self Care

## 2021-09-24 ENCOUNTER — Encounter: Payer: Self-pay | Admitting: Urgent Care

## 2021-09-24 DIAGNOSIS — L237 Allergic contact dermatitis due to plants, except food: Secondary | ICD-10-CM

## 2021-09-24 MED ORDER — PREDNISONE 10 MG (21) PO TBPK: ORAL_TABLET | Freq: Every day | ORAL | 0 refills | Status: DC

## 2021-09-24 NOTE — ED Provider Notes (Signed)
[facility]    CSN: [id number] Arrival date & time: 09/24/21  1510      History   Chief Complaint Chief Complaint  Patient presents with   Rash    HPI Darlene Ochoa is a 56 y.o. female.   The history is provided by the patient.  Rash Location:  Face Facial rash location:  Face, forehead, L eyelid, R eyelid, L cheek, R cheek and nose Quality: blistering, burning, itchiness and redness   Severity:  Mild Onset quality:  Sudden Duration:  1 day Timing:  Constant Progression:  Worsening Chronicity:  New Context: plant contact   Context comment:  Starting after blowing leaves in yard yesterday. Pushed glasses onto face with gloved hand, rash in location of contact with glove Relieved by:  Nothing Ineffective treatments:  Anti-itch cream (hydrocortisone and calamine lotion) Associated symptoms: no fever, no headaches, no periorbital edema, no shortness of breath, no sore throat, no throat swelling and not wheezing    Past Medical History:  Diagnosis Date   Anxiety    Chest pain 03/31/2012   Depression    H/O prolonged Q-T interval on ECG 03/31/2012   Hypertension    Major depressive disorder 03/31/2012   Seasonal allergies     Patient Active Problem List   Diagnosis Date Noted   Elevated LDL cholesterol level 09/11/2020   Right Achilles tendinitis 08/22/2020   Right lumbar radiculitis 04/04/2020   Impingement syndrome of right shoulder 03/28/2019   Primary osteoarthritis of both first carpometacarpal joints 07/29/2018   Tendonitis of both wrists 06/01/2018   Numbness and tingling in both hands 06/01/2018   Angio-edema 08/10/2017   Environmental allergies 07/21/2017   Seasonal allergic rhinitis 07/09/2017   Post-nasal drip 07/09/2017   Vitamin D deficiency 08/01/2014   B12 deficiency 08/01/2014   Prolonged Q-T interval on ECG 10/06/2013   Generalized anxiety disorder 12/27/2012   Anxiety 04/27/2012   Essential hypertension 04/27/2012   Major  depressive disorder 03/31/2012   Migraine 03/31/2012    Past Surgical History:  Procedure Laterality Date   [place] Right 2021    OB History   No obstetric history on file.      Home Medications    Prior to Admission medications   Medication Sig Start Date End Date Taking? Authorizing Provider  predniSONE (STERAPRED UNI-PAK 21 TAB) 10 MG (21) TBPK tablet Take by mouth daily. Take 6 tabs by mouth daily  for 2 days, then 5 tabs for 2 days, then 4 tabs for 2 days, then 3 tabs for 2 days, 2 tabs for 2 days, then 1 tab by mouth daily for 2 days 09/24/21  Yes Guerino Caporale L, PA  albuterol (VENTOLIN HFA) 108 (90 Base) MCG/ACT inhaler Inhale 2 puffs into the lungs every 4 (four) hours as needed for wheezing. 05/03/21   Breeback, Jade L, PA-C  buPROPion (WELLBUTRIN XL) 150 MG 24 hr tablet Take 150 mg by mouth daily. 11/25/16   [provider]  buPROPion (WELLBUTRIN XL) 300 MG 24 hr tablet Take 300 mg by mouth daily. 10/28/16   [provider]  cholecalciferol (VITAMIN D3) 25 MCG (1000 UNIT) tablet Take 1,000 Units by mouth daily.    [provider]  diclofenac Sodium (VOLTAREN) 1 % GEL APPLY 4 G TOPICALLY 4 (FOUR) TIMES DAILY. TO AFFECTED JOINT. 09/27/19   Iran Planas L, PA-C  FLUoxetine (PROZAC) 20 MG capsule Take 60 mg by mouth every morning. 02/23/20  [provider]  hydrochlorothiazide (HYDRODIURIL) 25 MG tablet TAKE 1 TABLET BY MOUTH EVERY DAY/ NEEDS APPT 09/19/21   Iran Planas L, PA-C  loratadine (CLARITIN) 10 MG tablet Take by mouth.    [provider]  meloxicam (MOBIC) 15 MG tablet TAKE 1 TABLET (15 MG TOTAL) BY MOUTH DAILY. 08/02/21   Breeback, Jade L, PA-C  QUEtiapine (SEROQUEL) 25 MG tablet Take 25 mg by mouth at bedtime. 03/24/20   [provider]  SUMAtriptan (IMITREX) 50 MG tablet Take 1 tablet (50 mg total) by mouth every 2 (two) hours as needed for migraine. May repeat in 2 hours if headache  persists or recurs. 12/04/16   Trixie Dredge, PA-C    Family History Family History  Problem Relation Age of Onset   Diabetes Mother    Cancer Mother        Breast   Depression Mother    CAD Mother    Heart failure Father    Stroke Father    Alcohol abuse Father    Alcohol abuse Brother    Drug abuse Brother     Social History Social History   Tobacco Use   Smoking status: Never   Smokeless tobacco: Never  Vaping Use   Vaping Use: Never used  Substance Use Topics   Alcohol use: No   Drug use: No     Allergies   Cefuroxime, Lac bovis, Lactose, Peanut oil, Peanut-containing drug products, Pork allergy, Doxycycline, Lactose intolerance (gi), Sulfa antibiotics, Trazodone and nefazodone, Codeine, Penicillins, and Sulfonamide derivatives   Review of Systems Review of Systems  Constitutional:  Negative for fever.  HENT:  Negative for sore throat.   Respiratory:  Negative for shortness of breath and wheezing.   Skin:  Positive for rash.  Neurological:  Negative for headaches.    Physical Exam Triage Vital Signs ED Triage Vitals  Enc Vitals Group     BP 09/24/21 1525 133/89     Pulse Rate 09/24/21 1525 71     Resp 09/24/21 1525 16     Temp 09/24/21 1525 98.4 F (36.9 C)     Temp Source 09/24/21 1525 Oral     SpO2 09/24/21 1525 98 %     Weight --      Height --      Head Circumference --      Peak Flow --      Pain Score 09/24/21 1527 0     Pain Loc --      Pain Edu? --      Excl. in [place]? --    No data found.  Updated Vital Signs BP 133/89 (BP Location: Right Arm)   Pulse 71   Temp 98.4 F (36.9 C) (Oral)   Resp 16   LMP 06/18/2017 (Approximate)   SpO2 98%   Visual Acuity Right Eye Distance:   Left Eye Distance:   Bilateral Distance:    Right Eye Near:   Left Eye Near:    Bilateral Near:     Physical Exam Vitals and nursing note reviewed.  Constitutional:      General: She is not in acute distress.    Appearance: Normal  appearance. She is normal weight. She is not ill-appearing, toxic-appearing or diaphoretic.  HENT:     Head: Normocephalic and atraumatic.     Right Ear: External ear normal.     Left Ear: External ear normal.     Nose: No congestion or rhinorrhea.  Mouth/Throat:     Mouth: Mucous membranes are moist.     Pharynx: Oropharynx is clear. No oropharyngeal exudate or posterior oropharyngeal erythema.  Eyes:     General: No scleral icterus.       Right eye: No discharge.        Left eye: No discharge.     Extraocular Movements: Extraocular movements intact.     Conjunctiva/sclera: Conjunctivae normal.     Pupils: Pupils are equal, round, and reactive to light.  Musculoskeletal:     Cervical back: Normal range of motion.  Lymphadenopathy:     Cervical: No cervical adenopathy.  Skin:    General: Skin is warm and dry.     Coloration: Skin is not jaundiced.     Findings: Erythema and rash (erythema with developing vesicles to T zone of face including bilateral cheeks and nasolabial folds. No flaking or drainage) present. No bruising.  Neurological:     Mental Status: She is alert.     UC Treatments / Results  Labs (all labs ordered are listed, but only abnormal results are displayed) Labs Reviewed - No data to display  EKG   Radiology No results found.  Procedures Procedures (including critical care time)  Medications Ordered in UC Medications - No data to display  Initial Impression / Assessment and Plan / UC Course  I have reviewed the triage vital signs and the nursing notes.  Pertinent labs & imaging results that were available during my care of the patient were reviewed by me and considered in my medical decision making (see chart for details).     Contact dermatitis - from plants. Pt deferred IM injection due to fear of needles. Will do 12 day taper to prevent rebound dermatitis. Avoid steroid cream on the face. Monitor for any changes - fever, worsening erythema,  severe flaking. Side effects of steroids (insomnia, appetite changes, mood changes) discussed.   Final Clinical Impressions(s) / UC Diagnoses   Final diagnoses:  Allergic contact dermatitis due to plants, except food     Discharge Instructions      Your rash is due to contact dermatitis due to plants. Please avoid itching your rash as this can cause secondary bacterial infections in severe cases. Start taking the prednisone taper as directed. Best to take in the mornings to prevent insomnia. Avoid alcohol wipes or topical sprays as this is drying and can make it more itchy. Follow up if rash does not improve, fever develops, or severe skin flaking occurs.     ED Prescriptions     Medication Sig Dispense Auth. Provider   predniSONE (STERAPRED UNI-PAK 21 TAB) 10 MG (21) TBPK tablet Take by mouth daily. Take 6 tabs by mouth daily  for 2 days, then 5 tabs for 2 days, then 4 tabs for 2 days, then 3 tabs for 2 days, 2 tabs for 2 days, then 1 tab by mouth daily for 2 days 42 tablet Diamond Martucci L, PA      PDMP not reviewed this encounter.   Chaney Malling, [place] 09/24/21 1549

## 2021-09-24 NOTE — Discharge Instructions (Signed)
Your rash is due to contact dermatitis due to plants. Please avoid itching your rash as this can cause secondary bacterial infections in severe cases. Start taking the prednisone taper as directed. Best to take in the mornings to prevent insomnia. Avoid alcohol wipes or topical sprays as this is drying and can make it more itchy. Follow up if rash does not improve, fever develops, or severe skin flaking occurs.

## 2021-09-24 NOTE — ED Triage Notes (Signed)
Pt noticed a rash on her face yesterday after working in the yard. She thinks she got into poison ivy. States it is spreading today all over her face. Using beadryl and calamine with  no relief.

## 2021-09-25 DIAGNOSIS — F411 Generalized anxiety disorder: Secondary | ICD-10-CM | POA: Diagnosis not present

## 2021-09-28 ENCOUNTER — Encounter: Payer: Self-pay | Admitting: Emergency Medicine

## 2021-09-28 ENCOUNTER — Emergency Department
Admission: EM | Admit: 2021-09-28 | Discharge: 2021-09-28 | Disposition: A | Discharge status: Home / Self Care | Payer: BC Managed Care – PPO | Source: Home / Self Care

## 2021-09-28 DIAGNOSIS — L237 Allergic contact dermatitis due to plants, except food: Secondary | ICD-10-CM

## 2021-09-28 MED ORDER — TRIAMCINOLONE ACETONIDE 0.1 % EX CREA: 1.0000 "application " | TOPICAL_CREAM | Freq: Two times a day (BID) | CUTANEOUS | 0 refills | Status: AC

## 2021-09-28 MED ORDER — MUPIROCIN 2 % EX OINT: 1.0000 "application " | TOPICAL_OINTMENT | Freq: Three times a day (TID) | CUTANEOUS | 0 refills | Status: DC

## 2021-09-28 MED ORDER — HYDROXYZINE HCL 25 MG PO TABS: 25.0000 mg | ORAL_TABLET | Freq: Four times a day (QID) | ORAL | 0 refills | Status: DC | PRN

## 2021-09-28 NOTE — Discharge Instructions (Addendum)
Continue your oral prednisone taper until it is gone, a full 12 day supply. Start hydroxyzine today, every 6-8 hours as needed. This medication may make you drowsy. Topical triamcinolone can be used to the spot on your neck. If your facial rash is not improving drastically with the steroid/hydroxyzine combo, then a VERY short course can be used on the face, but for no more than 5 days and NOT around the eyes. Lastly, I have called in mupirocin, a topical antibiotic for you to ensure this is not a developing cellulitis. Apply to face three times daily.

## 2021-09-28 NOTE — ED Triage Notes (Signed)
Returning for ongoing rash  Poison  ivy  exposure on 09/23/21 Remains on her face  Taking steroids

## 2021-09-28 NOTE — ED Provider Notes (Signed)
[facility]    CSN: [id number] Arrival date & time: 09/28/21  0800      History   Chief Complaint Chief Complaint  Patient presents with   Poison Ivy    HPI Darlene Ochoa is a 56 y.o. female.   Pleasant 56yo female presents today due to concerns of her poison ivy. Was seen on 09/24/21 and given a steroid taper. She did not start it until 09/25/21. She is on day 4 of 12, took 50mg  prednisone this morning. Overall, states she feels the vesicles have dried out and improved, but the itching is still intense and her R eye was swollen shut this morning. Denies erythema or drainage of eye. No pain in eye or fever. She has been applying topical calamine to her L nasolabial fold only. Denies any new locations of the rash apart from a single lesion to her R lateral neck. Denies any systemic symptoms - no dysphagia, swelling, wheezing or GERD.   Poison Ivy    Past Medical History:  Diagnosis Date   Anxiety    Chest pain 03/31/2012   Depression    H/O prolonged Q-T interval on ECG 03/31/2012   Hypertension    Major depressive disorder 03/31/2012   Seasonal allergies     Patient Active Problem List   Diagnosis Date Noted   Elevated LDL cholesterol level 09/11/2020   Right Achilles tendinitis 08/22/2020   Right lumbar radiculitis 04/04/2020   Impingement syndrome of right shoulder 03/28/2019   Primary osteoarthritis of both first carpometacarpal joints 07/29/2018   Tendonitis of both wrists 06/01/2018   Numbness and tingling in both hands 06/01/2018   Angio-edema 08/10/2017   Environmental allergies 07/21/2017   Seasonal allergic rhinitis 07/09/2017   Post-nasal drip 07/09/2017   Vitamin D deficiency 08/01/2014   B12 deficiency 08/01/2014   Prolonged Q-T interval on ECG 10/06/2013   Generalized anxiety disorder 12/27/2012   Anxiety 04/27/2012   Essential hypertension 04/27/2012   Major depressive disorder 03/31/2012   Migraine 03/31/2012    Past Surgical History:   Procedure Laterality Date   CESAREAN SECTION  1998   ROTATOR CUFF REPAIR Right 2021    OB History   No obstetric history on file.      Home Medications    Prior to Admission medications   Medication Sig Start Date End Date Taking? Authorizing Provider  hydrOXYzine (ATARAX) 25 MG tablet Take 1 tablet (25 mg total) by mouth every 6 (six) hours as needed for itching. 09/28/21  Yes Ravon Mcilhenny L, PA  mupirocin ointment (BACTROBAN) 2 % Apply 1 application  topically 3 (three) times daily. 09/28/21  Yes Kena Limon L, PA  triamcinolone cream (KENALOG) 0.1 % Apply 1 application  topically 2 (two) times daily. 09/28/21  Yes Ernest Orr L, PA  albuterol (VENTOLIN HFA) 108 (90 Base) MCG/ACT inhaler Inhale 2 puffs into the lungs every 4 (four) hours as needed for wheezing. Patient not taking: Reported on 09/28/2021 05/03/21   05/05/21, PA-C  buPROPion (WELLBUTRIN XL) 150 MG 24 hr tablet Take 150 mg by mouth daily. 11/25/16   [provider]  buPROPion (WELLBUTRIN XL) 300 MG 24 hr tablet Take 300 mg by mouth daily. 10/28/16   [provider]  cholecalciferol (VITAMIN D3) 25 MCG (1000 UNIT) tablet Take 1,000 Units by mouth daily.    [provider]  diclofenac Sodium (VOLTAREN) 1 % GEL APPLY 4 G TOPICALLY 4 (FOUR) TIMES DAILY. TO AFFECTED JOINT. 09/27/19   11/27/19,  Jade L, PA-C  FLUoxetine (PROZAC) 20 MG capsule Take 60 mg by mouth every morning. 02/23/20   [provider]  hydrochlorothiazide (HYDRODIURIL) 25 MG tablet TAKE 1 TABLET BY MOUTH EVERY DAY/ NEEDS APPT 09/19/21   Tandy Gaw L, PA-C  loratadine (CLARITIN) 10 MG tablet Take 10 mg by mouth daily.    [provider]  meloxicam (MOBIC) 15 MG tablet TAKE 1 TABLET (15 MG TOTAL) BY MOUTH DAILY. 08/02/21   Breeback, Jade L, PA-C  predniSONE (STERAPRED UNI-PAK 21 TAB) 10 MG (21) TBPK tablet Take by mouth daily. Take 6 tabs by mouth daily  for 2 days, then 5 tabs for 2 days, then 4 tabs for 2  days, then 3 tabs for 2 days, 2 tabs for 2 days, then 1 tab by mouth daily for 2 days 09/24/21   Verlon Setting, Edris Schneck L, PA  QUEtiapine (SEROQUEL) 25 MG tablet Take 25 mg by mouth at bedtime. 03/24/20   [provider]  SUMAtriptan (IMITREX) 50 MG tablet Take 1 tablet (50 mg total) by mouth every 2 (two) hours as needed for migraine. May repeat in 2 hours if headache persists or recurs. 12/04/16   Carlis Stable, PA-C    Family History Family History  Problem Relation Age of Onset   Diabetes Mother    Cancer Mother        Breast   Depression Mother    CAD Mother    Heart failure Father    Stroke Father    Alcohol abuse Father    Alcohol abuse Brother    Drug abuse Brother     Social History Social History   Tobacco Use   Smoking status: Never   Smokeless tobacco: Never  Vaping Use   Vaping Use: Never used  Substance Use Topics   Alcohol use: No   Drug use: No     Allergies   Cefuroxime, Lac bovis, Lactose, Peanut oil, Peanut-containing drug products, Pork allergy, Doxycycline, Lactose intolerance (gi), Sulfa antibiotics, Trazodone and nefazodone, Codeine, Penicillins, and Sulfonamide derivatives   Review of Systems Review of Systems As per HPI  Physical Exam Triage Vital Signs ED Triage Vitals  Enc Vitals Group     BP 09/28/21 0815 137/89     Pulse Rate 09/28/21 0815 74     Resp 09/28/21 0815 15     Temp 09/28/21 0815 99.3 F (37.4 C)     Temp Source 09/28/21 0815 Oral     SpO2 09/28/21 0815 100 %     Weight 09/28/21 0816 142 lb (64.4 kg)     Height 09/28/21 0816  (1.651 m)     Head Circumference --      Peak Flow --      Pain Score 09/28/21 0816 3     Pain Loc --      Pain Edu? --      Excl. in [place]? --    No data found.  Updated Vital Signs BP 137/89 (BP Location: Right Arm)   Pulse 74   Temp 99.3 F (37.4 C) (Oral)   Resp 15   Ht  (1.651 m)   Wt 142 lb (64.4 kg)   LMP 06/18/2017 (Approximate)   SpO2 100%   BMI 23.63  kg/m   Visual Acuity Right Eye Distance:   Left Eye Distance:   Bilateral Distance:    Right Eye Near:   Left Eye Near:    Bilateral Near:     Physical Exam  Vitals and nursing note reviewed.  Constitutional:      General: She is not in acute distress.    Appearance: Normal appearance. She is normal weight. She is not ill-appearing, toxic-appearing or diaphoretic.  HENT:     Head: Normocephalic and atraumatic.     Right Ear: External ear normal.     Left Ear: External ear normal.     Nose: No congestion or rhinorrhea.     Mouth/Throat:     Mouth: Mucous membranes are moist.     Pharynx: Oropharynx is clear. No oropharyngeal exudate or posterior oropharyngeal erythema.  Eyes:     General: Vision grossly intact. No allergic shiner or scleral icterus.       Right eye: No discharge.        Left eye: No discharge.     Extraocular Movements: Extraocular movements intact.     Conjunctiva/sclera: Conjunctivae normal.     Pupils: Pupils are equal, round, and reactive to light.     Comments: Upper and lower eyelid on L swollen without evidence of cellulitis. Transilluminates with light.  Musculoskeletal:     Cervical back: Normal range of motion.  Lymphadenopathy:     Cervical: No cervical adenopathy.  Skin:    General: Skin is warm and dry.     Coloration: Skin is not jaundiced.     Findings: Erythema and rash (improving erythema with resolution of vesicles which have crusted over. Crusting to L nasolabial fold only. No flaking or drainage) present. No bruising.  Neurological:     Mental Status: She is alert.      UC Treatments / Results  Labs (all labs ordered are listed, but only abnormal results are displayed) Labs Reviewed - No data to display  EKG   Radiology No results found.  Procedures Procedures (including critical care time)  Medications Ordered in UC Medications - No data to display  Initial Impression / Assessment and Plan / UC Course  I have  reviewed the triage vital signs and the nursing notes.  Pertinent labs & imaging results that were available during my care of the patient were reviewed by me and considered in my medical decision making (see chart for details).     Allergic contact dermatitis to poison ivy - pt still has 8 days left of PO prednisone. Will add hydroxyzine to help with itch and swelling. Mupirocin over the crusting to prevent secondary impetigo. Pt allergic to many PO abx. Topical steroid cream to neck only, if still no improvement can do very short course on face, but must avoid eyes.  Final Clinical Impressions(s) / UC Diagnoses   Final diagnoses:  Allergic contact dermatitis due to urushiol from [place]     Discharge Instructions      Continue your oral prednisone taper until it is gone, a full 12 day supply. Start hydroxyzine today, every 6-8 hours as needed. This medication may make you drowsy. Topical triamcinolone can be used to the spot on your neck. If your facial rash is not improving drastically with the steroid/hydroxyzine combo, then a VERY short course can be used on the face, but for no more than 5 days and NOT around the eyes. Lastly, I have called in mupirocin, a topical antibiotic for you to ensure this is not a developing cellulitis. Apply to face three times daily.    ED Prescriptions     Medication Sig Dispense Auth. Provider   mupirocin ointment (BACTROBAN) 2 % Apply 1 application  topically 3 (three) times daily. 22 g Aleayah Chico L, PA   triamcinolone cream (KENALOG) 0.1 % Apply 1 application  topically 2 (two) times daily. 15 g Breckan Cafiero L, PA   hydrOXYzine (ATARAX) 25 MG tablet Take 1 tablet (25 mg total) by mouth every 6 (six) hours as needed for itching. 20 tablet Gena Laski L, [place]      PDMP not reviewed this encounter.   Maretta Bees, [place] 09/28/21 1552

## 2021-10-02 ENCOUNTER — Other Ambulatory Visit: Payer: Self-pay | Admitting: Physician Assistant

## 2021-10-02 DIAGNOSIS — I10 Essential (primary) hypertension: Secondary | ICD-10-CM

## 2021-10-09 DIAGNOSIS — F411 Generalized anxiety disorder: Secondary | ICD-10-CM | POA: Diagnosis not present

## 2021-10-23 DIAGNOSIS — F411 Generalized anxiety disorder: Secondary | ICD-10-CM | POA: Diagnosis not present

## 2021-11-03 ENCOUNTER — Other Ambulatory Visit: Payer: Self-pay | Admitting: Physician Assistant

## 2021-11-03 DIAGNOSIS — M5416 Radiculopathy, lumbar region: Secondary | ICD-10-CM

## 2021-11-03 DIAGNOSIS — M7661 Achilles tendinitis, right leg: Secondary | ICD-10-CM

## 2021-11-03 DIAGNOSIS — M180 Bilateral primary osteoarthritis of first carpometacarpal joints: Secondary | ICD-10-CM

## 2021-11-03 DIAGNOSIS — R200 Anesthesia of skin: Secondary | ICD-10-CM

## 2021-11-06 DIAGNOSIS — F411 Generalized anxiety disorder: Secondary | ICD-10-CM | POA: Diagnosis not present

## 2021-11-21 ENCOUNTER — Encounter: Payer: Self-pay | Admitting: Neurology

## 2021-11-27 DIAGNOSIS — F411 Generalized anxiety disorder: Secondary | ICD-10-CM | POA: Diagnosis not present

## 2021-12-03 ENCOUNTER — Other Ambulatory Visit: Payer: Self-pay | Admitting: Physician Assistant

## 2021-12-03 DIAGNOSIS — R200 Anesthesia of skin: Secondary | ICD-10-CM

## 2021-12-03 DIAGNOSIS — M5416 Radiculopathy, lumbar region: Secondary | ICD-10-CM

## 2021-12-03 DIAGNOSIS — M7661 Achilles tendinitis, right leg: Secondary | ICD-10-CM

## 2021-12-03 DIAGNOSIS — M180 Bilateral primary osteoarthritis of first carpometacarpal joints: Secondary | ICD-10-CM

## 2021-12-24 ENCOUNTER — Other Ambulatory Visit: Payer: Self-pay | Admitting: Physician Assistant

## 2021-12-24 DIAGNOSIS — M180 Bilateral primary osteoarthritis of first carpometacarpal joints: Secondary | ICD-10-CM

## 2021-12-24 DIAGNOSIS — M5416 Radiculopathy, lumbar region: Secondary | ICD-10-CM

## 2021-12-24 DIAGNOSIS — R200 Anesthesia of skin: Secondary | ICD-10-CM

## 2021-12-24 DIAGNOSIS — M7661 Achilles tendinitis, right leg: Secondary | ICD-10-CM

## 2022-01-01 ENCOUNTER — Encounter: Payer: Self-pay | Admitting: Physician Assistant

## 2022-01-01 ENCOUNTER — Ambulatory Visit: Payer: BC Managed Care – PPO | Admitting: Physician Assistant

## 2022-01-01 VITALS — BP 136/79 | HR 72 | Ht 65.0 in | Wt 144.0 lb

## 2022-01-01 DIAGNOSIS — Z13220 Encounter for screening for lipoid disorders: Secondary | ICD-10-CM | POA: Diagnosis not present

## 2022-01-01 DIAGNOSIS — I10 Essential (primary) hypertension: Secondary | ICD-10-CM

## 2022-01-01 DIAGNOSIS — Z1329 Encounter for screening for other suspected endocrine disorder: Secondary | ICD-10-CM | POA: Diagnosis not present

## 2022-01-01 DIAGNOSIS — Z1159 Encounter for screening for other viral diseases: Secondary | ICD-10-CM

## 2022-01-01 DIAGNOSIS — Z79899 Other long term (current) drug therapy: Secondary | ICD-10-CM

## 2022-01-01 DIAGNOSIS — M7661 Achilles tendinitis, right leg: Secondary | ICD-10-CM

## 2022-01-01 DIAGNOSIS — Z808 Family history of malignant neoplasm of other organs or systems: Secondary | ICD-10-CM

## 2022-01-01 DIAGNOSIS — Z1211 Encounter for screening for malignant neoplasm of colon: Secondary | ICD-10-CM

## 2022-01-01 DIAGNOSIS — Z1231 Encounter for screening mammogram for malignant neoplasm of breast: Secondary | ICD-10-CM

## 2022-01-01 DIAGNOSIS — M5416 Radiculopathy, lumbar region: Secondary | ICD-10-CM

## 2022-01-01 DIAGNOSIS — R239 Unspecified skin changes: Secondary | ICD-10-CM

## 2022-01-01 DIAGNOSIS — G43009 Migraine without aura, not intractable, without status migrainosus: Secondary | ICD-10-CM

## 2022-01-01 DIAGNOSIS — R202 Paresthesia of skin: Secondary | ICD-10-CM

## 2022-01-01 DIAGNOSIS — M180 Bilateral primary osteoarthritis of first carpometacarpal joints: Secondary | ICD-10-CM

## 2022-01-01 DIAGNOSIS — R200 Anesthesia of skin: Secondary | ICD-10-CM

## 2022-01-01 MED ORDER — HYDROCHLOROTHIAZIDE 25 MG PO TABS: 25.0000 mg | ORAL_TABLET | Freq: Every day | ORAL | 1 refills | Status: DC

## 2022-01-01 MED ORDER — SUMATRIPTAN SUCCINATE 50 MG PO TABS: 50.0000 mg | ORAL_TABLET | ORAL | 0 refills | Status: AC | PRN

## 2022-01-01 MED ORDER — MELOXICAM 15 MG PO TABS: 15.0000 mg | ORAL_TABLET | Freq: Every day | ORAL | 3 refills | Status: DC

## 2022-01-01 NOTE — Progress Notes (Signed)
Established Patient Office Visit  Subjective   Patient ID: Darlene Ochoa, female    DOB: [birth year]  Age: 56 y.o. MRN: [id number]  Chief Complaint  Patient presents with   Follow-up   Hypertension    HPI Pt is a 56 yo female with HTN who is here for follow up.   She is doing great on HCTZ. No concerns. No CP, palpitations, headaches, or vision changes.   Pt is concerned about skin cancer. Her brother was dx with what she thinks was melanoma and this has her concerned about all her moles.   Mobic is working great for OA of hands and thumb. She would like to continue. She takes at bedtime and seems to make her stomach a little uneasy.   .. Active Ambulatory Problems    Diagnosis Date Noted   Major depressive disorder 03/31/2012   Migraine 03/31/2012   Anxiety 04/27/2012   Essential hypertension 04/27/2012   Generalized anxiety disorder 12/27/2012   Prolonged Q-T interval on ECG 10/06/2013   Vitamin D deficiency 08/01/2014   B12 deficiency 08/01/2014   Seasonal allergic rhinitis 07/09/2017   Post-nasal drip 07/09/2017   Environmental allergies 07/21/2017   Angio-edema 08/10/2017   Tendonitis of both wrists 06/01/2018   Numbness and tingling in both hands 06/01/2018   Primary osteoarthritis of both first carpometacarpal joints 07/29/2018   Impingement syndrome of right shoulder 03/28/2019   Right lumbar radiculitis 04/04/2020   Right Achilles tendinitis 08/22/2020   Elevated LDL cholesterol level 09/11/2020   Resolved Ambulatory Problems    Diagnosis Date Noted   Palpitations 08/02/2009   Chest pain 03/31/2012   Shortness of breath 03/31/2012   Major depressive disorder, recurrent episode, moderate (HCC) 12/27/2012   Trapezius muscle spasm 08/21/2016   Acute pain of left shoulder 08/21/2016   Submental mass 12/07/2016   Seasonal allergies 07/21/2017   Eustachian tube dysfunction, bilateral 08/17/2017   Bilateral hand pain 06/01/2018   Bilateral thumb pain 06/01/2018    Bilateral wrist pain 06/01/2018   Past Medical History:  Diagnosis Date   Depression    H/O prolonged Q-T interval on ECG 03/31/2012   Hypertension      ROS See HPI.    Objective:     BP 136/79   Pulse 72   Ht 5\' 5"  (1.651 m)   Wt 144 lb (65.3 kg)   LMP 06/18/2017 (Approximate)   SpO2 100%   BMI 23.96 kg/m  BP Readings from Last 3 Encounters:  01/01/22 136/79  09/28/21 137/89  09/24/21 133/89   Wt Readings from Last 3 Encounters:  01/01/22 144 lb (65.3 kg)  09/28/21 142 lb (64.4 kg)  05/03/21 144 lb (65.3 kg)      Physical Exam Constitutional:      Appearance: Normal appearance.  HENT:     Head: Normocephalic.  Neck:     Vascular: No carotid bruit.  Cardiovascular:     Rate and Rhythm: Normal rate and regular rhythm.     Pulses: Normal pulses.     Heart sounds: Normal heart sounds.  Pulmonary:     Effort: Pulmonary effort is normal.     Breath sounds: Normal breath sounds.  Musculoskeletal:     Right lower leg: No edema.     Left lower leg: No edema.  Lymphadenopathy:     Cervical: No cervical adenopathy.  Skin:    Comments: Slightly raised SK on face, multiple.  Raised nevus on right neck. Scattered cherry angiomas over trunk and extermities.  Neurological:     General: No focal deficit present.     Mental Status: She is alert and oriented to person, place, and time.  Psychiatric:        Mood and Affect: Mood normal.          Assessment & Plan:  Marland KitchenMarland KitchenMakaila was seen today for follow-up and hypertension.  Diagnoses and all orders for this visit:  Essential hypertension -     COMPLETE METABOLIC PANEL WITH GFR -     hydrochlorothiazide (HYDRODIURIL) 25 MG tablet; Take 1 tablet (25 mg total) by mouth daily.  Thyroid disorder screen -     TSH  Lipid screening -     Lipid Panel w/reflex Direct LDL  Medication management -     TSH -     Lipid Panel w/reflex Direct LDL -     COMPLETE METABOLIC PANEL WITH GFR -     CBC with  Differential/Platelet  Migraine without aura and without status migrainosus, not intractable -     SUMAtriptan (IMITREX) 50 MG tablet; Take 1 tablet (50 mg total) by mouth every 2 (two) hours as needed for migraine. May repeat in 2 hours if headache persists or recurs.  Right Achilles tendinitis -     meloxicam (MOBIC) 15 MG tablet; Take 1 tablet (15 mg total) by mouth daily.  Right lumbar radiculitis -     meloxicam (MOBIC) 15 MG tablet; Take 1 tablet (15 mg total) by mouth daily.  Numbness and tingling in both hands -     meloxicam (MOBIC) 15 MG tablet; Take 1 tablet (15 mg total) by mouth daily.  Primary osteoarthritis of both first carpometacarpal joints -     meloxicam (MOBIC) 15 MG tablet; Take 1 tablet (15 mg total) by mouth daily.  Visit for screening mammogram -     MM 3D SCREEN BREAST BILATERAL  Colon cancer screening -     Cologuard  Unspecified skin changes -     Ambulatory referral to Dermatology  Family history of skin cancer -     Ambulatory referral to Dermatology  Encounter for hepatitis C screening test for low risk patient -     Hepatitis C Antibody   BP to goal Refilled HCTZ Cmp ordered with other fasting labs  Agreed to do cologuard Mammogram ordered  Discussed skin and changes She has cherry angiomas/nevus/seborrheic keratosis none of which are concerning Will make referral to dermatology for yearly skin check   Return in about 6 months (around 07/02/2022).    Tandy Gaw, PA-C

## 2022-01-02 LAB — LIPID PANEL W/REFLEX DIRECT LDL
Cholesterol: 229 mg/dL — ABNORMAL HIGH (ref <200)
HDL: 57 mg/dL (ref >=50)
LDL Cholesterol (Calc): 155 mg/dL (calc) — ABNORMAL HIGH
Non-HDL Cholesterol (Calc): 172 mg/dL (calc) — ABNORMAL HIGH (ref <130)
Total CHOL/HDL Ratio: 4 (calc) (ref <5.0)
Triglycerides: 70 mg/dL (ref <150)

## 2022-01-02 LAB — CBC WITH DIFFERENTIAL/PLATELET
Absolute Monocytes: 282 cells/uL (ref 200–950)
Basophils Absolute: 40 cells/uL (ref 0–200)
Basophils Relative: 0.9 %
Eosinophils Absolute: 172 cells/uL (ref 15–500)
Eosinophils Relative: 3.9 %
HCT: 37.3 % (ref 35.0–45.0)
Hemoglobin: 12.5 g/dL (ref 11.7–15.5)
Lymphs Abs: 1505 cells/uL (ref 850–3900)
MCH: 30 pg (ref 27.0–33.0)
MCHC: 33.5 g/dL (ref 32.0–36.0)
MCV: 89.7 fL (ref 80.0–100.0)
MPV: 10.9 fL (ref 7.5–12.5)
Monocytes Relative: 6.4 %
Neutro Abs: 2402 cells/uL (ref 1500–7800)
Neutrophils Relative %: 54.6 %
Platelets: 223 10*3/uL (ref 140–400)
RBC: 4.16 10*6/uL (ref 3.80–5.10)
RDW: 12.3 % (ref 11.0–15.0)
Total Lymphocyte: 34.2 %
WBC: 4.4 10*3/uL (ref 3.8–10.8)

## 2022-01-02 LAB — COMPLETE METABOLIC PANEL WITH GFR
AG Ratio: 1.7 (calc) (ref 1.0–2.5)
ALT: 12 U/L (ref 6–29)
AST: 15 U/L (ref 10–35)
Albumin: 4.5 g/dL (ref 3.6–5.1)
Alkaline phosphatase (APISO): 88 U/L (ref 37–153)
BUN: 19 mg/dL (ref 7–25)
CO2: 25 mmol/L (ref 20–32)
Calcium: 9.6 mg/dL (ref 8.6–10.4)
Chloride: 107 mmol/L (ref 98–110)
Creat: 0.81 mg/dL (ref 0.50–1.03)
Globulin: 2.6 g/dL (calc) (ref 1.9–3.7)
Glucose, Bld: 83 mg/dL (ref 65–99)
Potassium: 4.3 mmol/L (ref 3.5–5.3)
Sodium: 140 mmol/L (ref 135–146)
Total Bilirubin: 0.4 mg/dL (ref 0.2–1.2)
Total Protein: 7.1 g/dL (ref 6.1–8.1)
eGFR: 85 mL/min/{1.73_m2} (ref >=60)

## 2022-01-02 LAB — HEPATITIS C ANTIBODY: Hepatitis C Ab: NONREACTIVE

## 2022-01-02 LAB — TSH: TSH: 0.7 mIU/L (ref 0.40–4.50)

## 2022-01-03 DIAGNOSIS — F3342 Major depressive disorder, recurrent, in full remission: Secondary | ICD-10-CM | POA: Diagnosis not present

## 2022-01-03 DIAGNOSIS — F411 Generalized anxiety disorder: Secondary | ICD-10-CM | POA: Diagnosis not present

## 2022-01-03 NOTE — Progress Notes (Signed)
Darl Pikes,   Kidney, liver, glucose look good.  Hemoglobin and WBC look good.   10 year CV risk is 3.7 percent.  LDL, bad cholesterol, is up from last year. Goal LDL with your risk factors is under 130. I would suggest starting a low dose statin to lower cholesterol. Thoughts?   Marland KitchenMarland KitchenThe 10-year ASCVD risk score (Arnett DK, et al., 2019) is: 3.7%   Values used to calculate the score:     Age: 56 years     Sex: Female     Is Non-Hispanic African American: No     Diabetic: No     Tobacco smoker: No     Systolic Blood Pressure: 136 mmHg     Is BP treated: Yes     HDL Cholesterol: 57 mg/dL     Total Cholesterol: 229 mg/dL

## 2022-01-08 DIAGNOSIS — F411 Generalized anxiety disorder: Secondary | ICD-10-CM | POA: Diagnosis not present

## 2022-01-16 IMAGING — DX DG LUMBAR SPINE COMPLETE 4+V
5 series · 5 of 5 positions shown · non-contrast
Comparison: No prior.

CLINICAL DATA: Buttock pain.  No injury.

EXAM:
LUMBAR SPINE - COMPLETE 4+ VIEW

[l-spine ap]
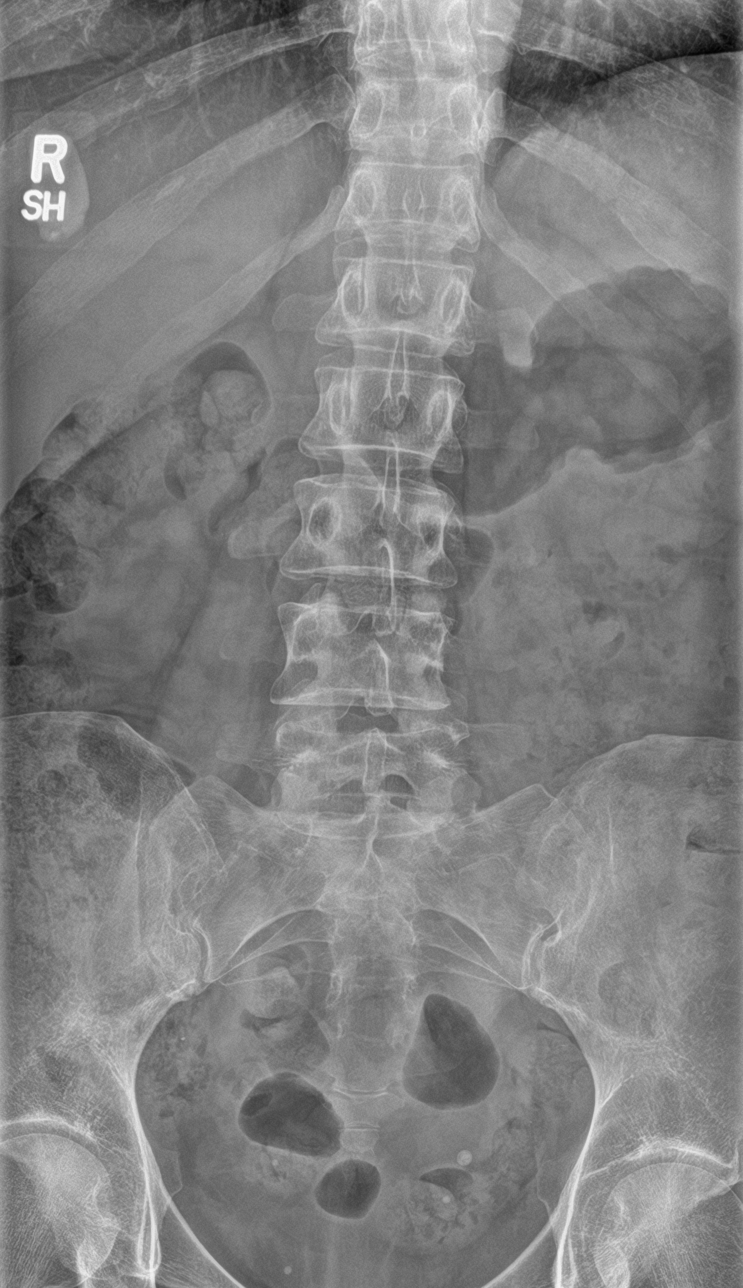

[l-spine obl (1 of 2)]
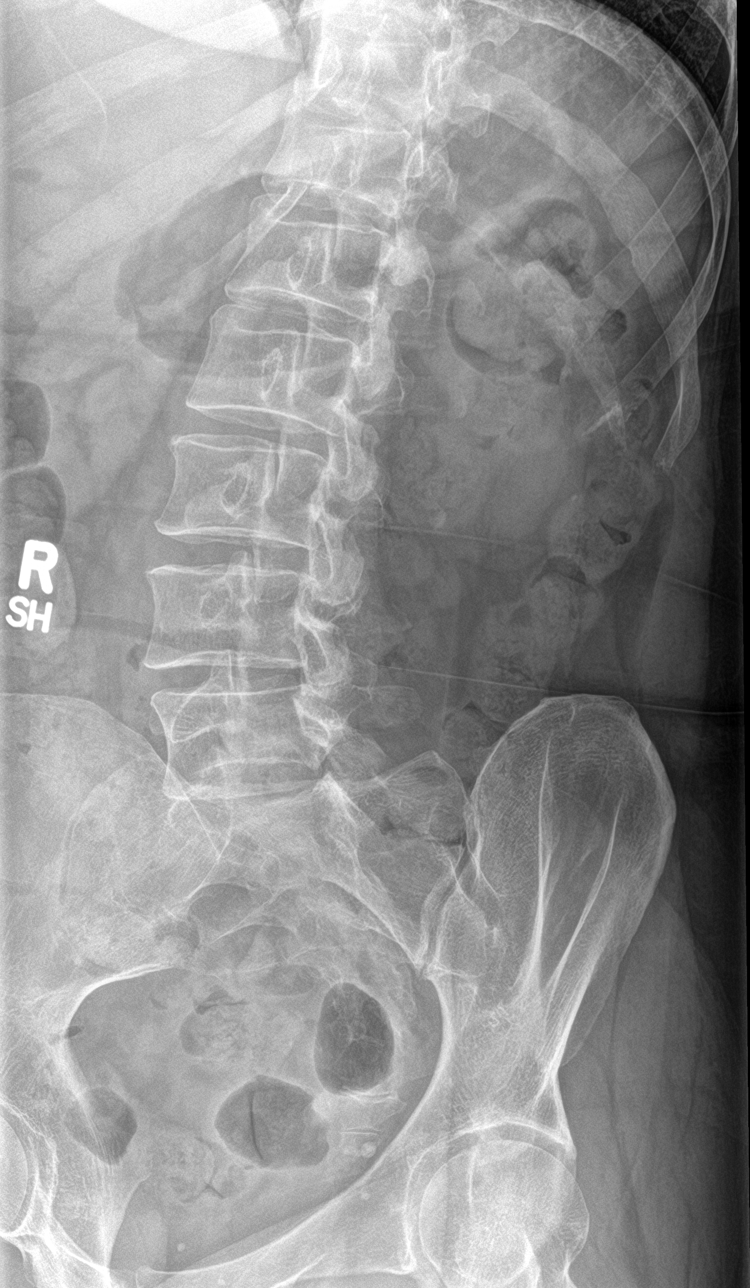

[l-spine obl (2 of 2)]
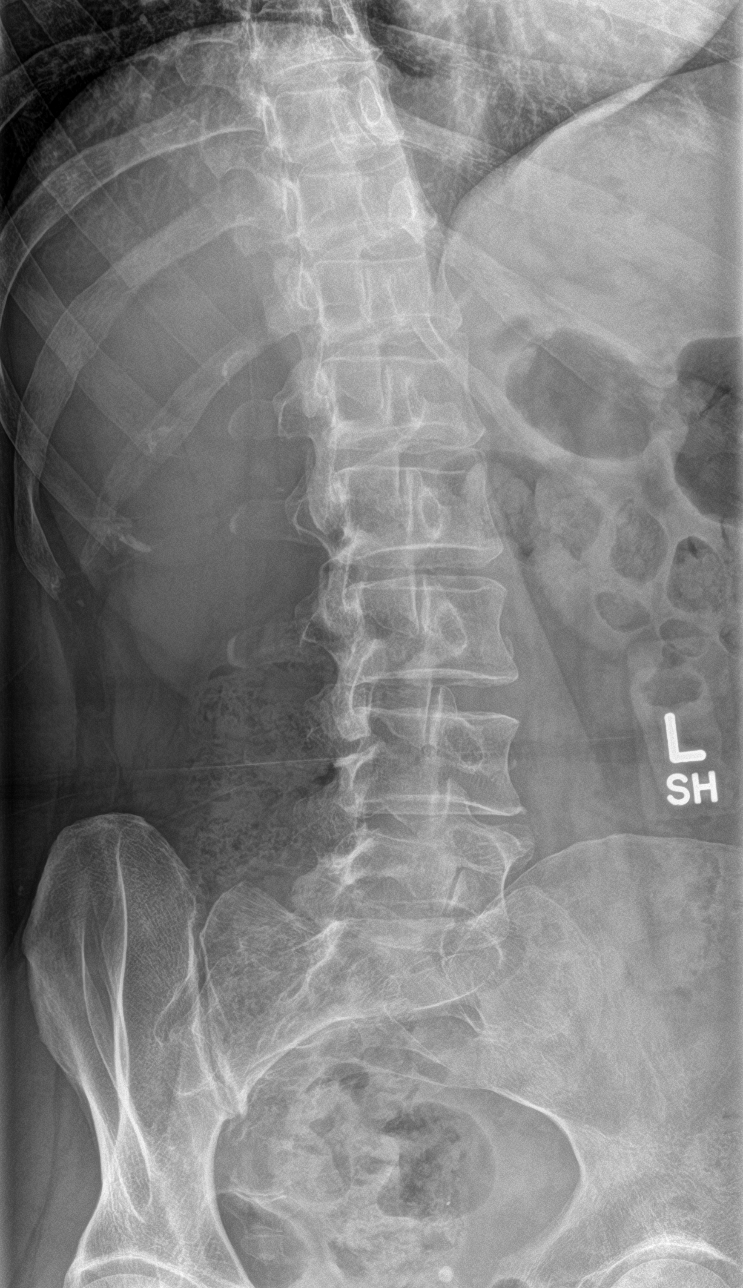

[l-spine lat]
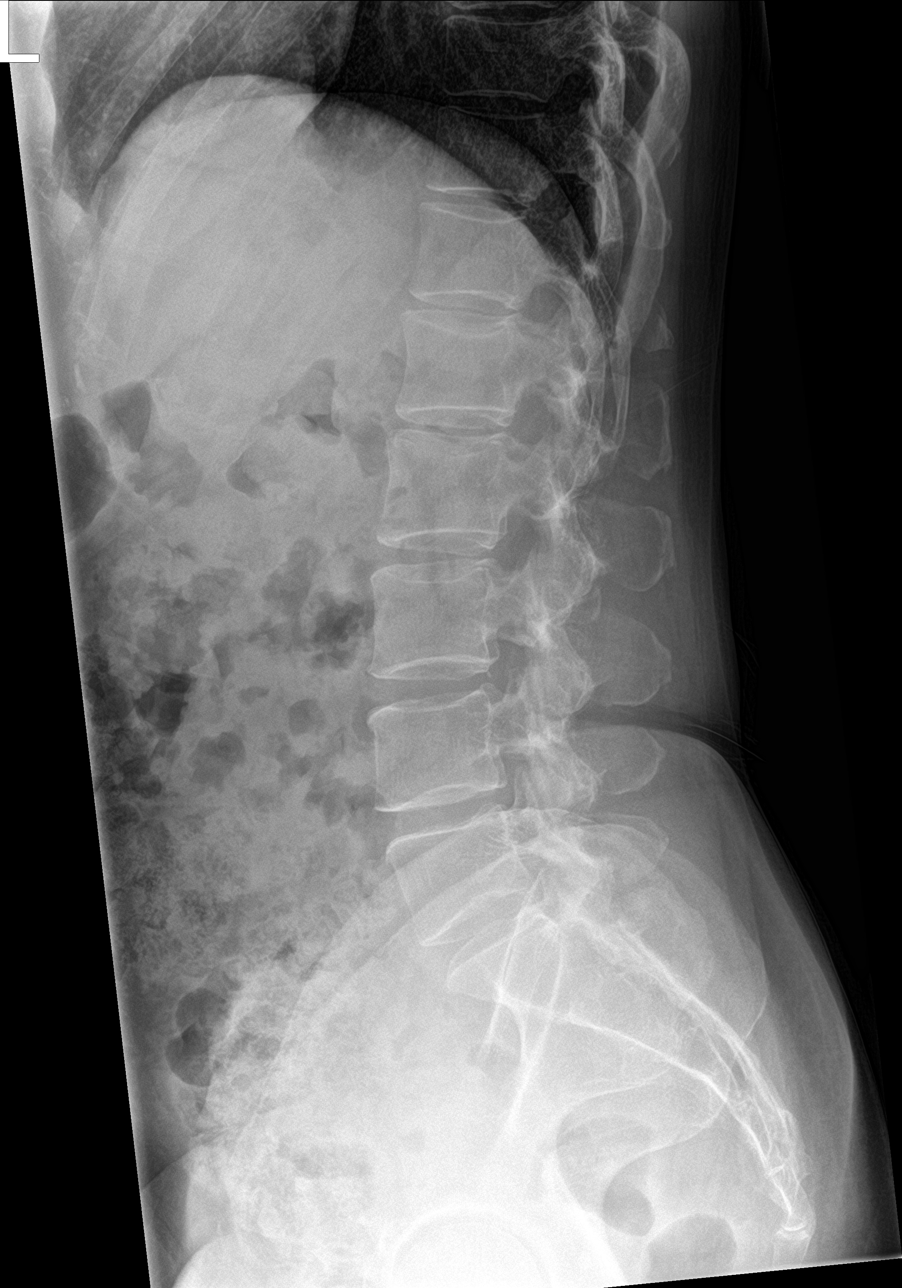

[l-spine spot]
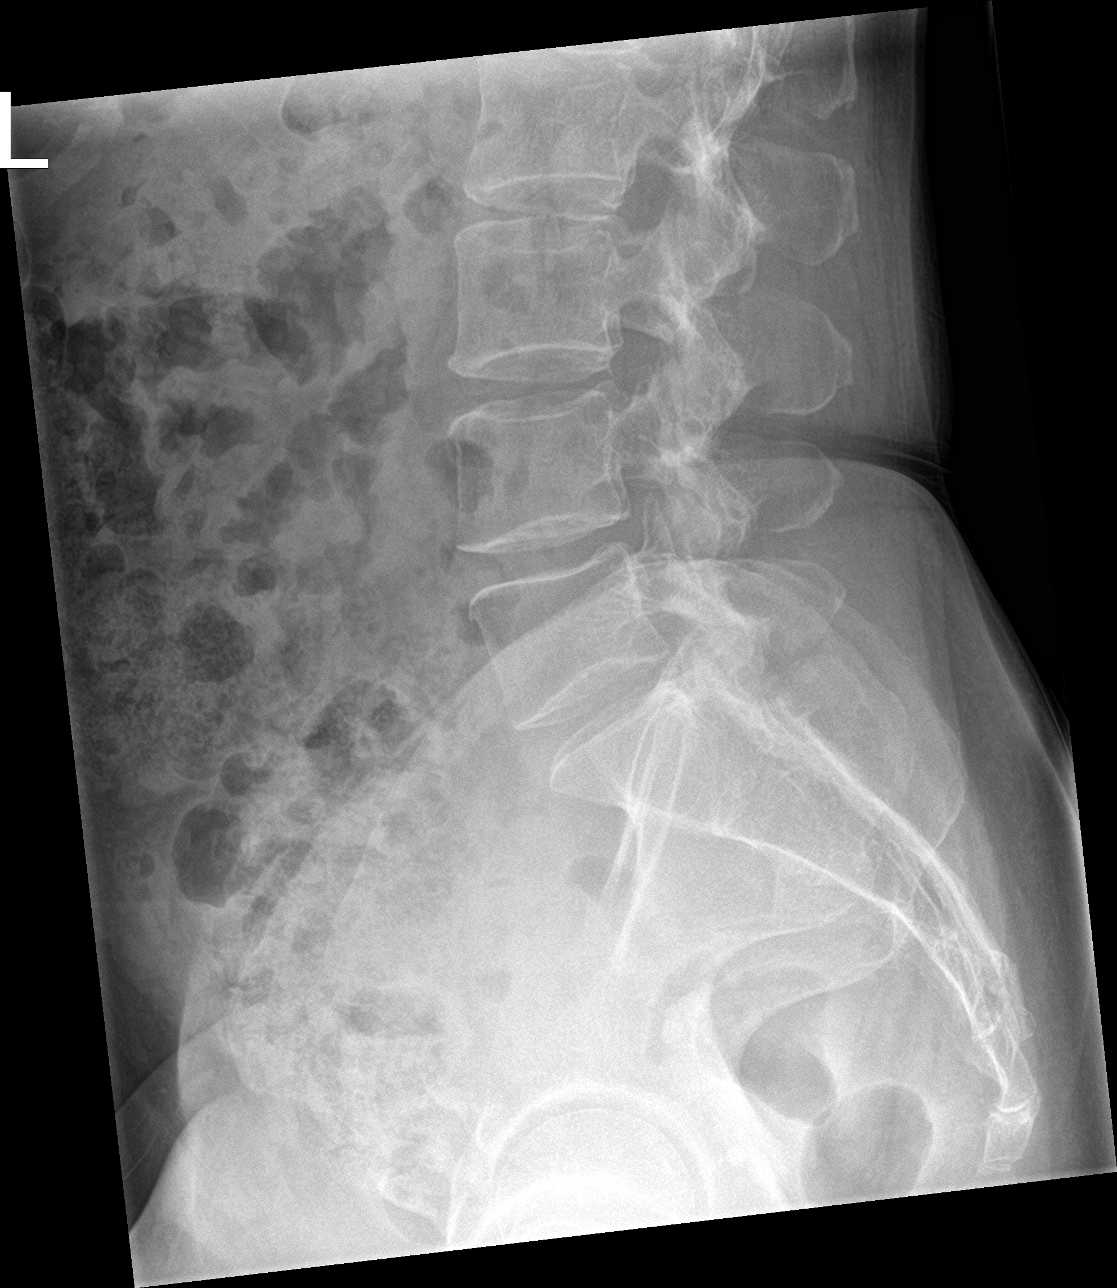

[5 of 5 positions shown; findings below may reference images not displayed]

FINDINGS: Mild lumbar scoliosis concave left. No acute bony abnormality
identified. No evidence of fracture. Pelvic calcifications
consistent phleboliths.
IMPRESSION: Mild lumbar scoliosis concave left. No acute bony abnormality
identified.

## 2022-01-22 DIAGNOSIS — F411 Generalized anxiety disorder: Secondary | ICD-10-CM | POA: Diagnosis not present

## 2022-01-29 DIAGNOSIS — Z1211 Encounter for screening for malignant neoplasm of colon: Secondary | ICD-10-CM | POA: Diagnosis not present

## 2022-02-05 ENCOUNTER — Other Ambulatory Visit: Payer: Self-pay | Admitting: Physician Assistant

## 2022-02-05 DIAGNOSIS — R195 Other fecal abnormalities: Secondary | ICD-10-CM

## 2022-02-05 DIAGNOSIS — F411 Generalized anxiety disorder: Secondary | ICD-10-CM | POA: Diagnosis not present

## 2022-02-05 LAB — COLOGUARD: COLOGUARD: POSITIVE — AB

## 2022-02-05 NOTE — Progress Notes (Signed)
Your cologuard was positive. This does not mean you have colon cancer but it does mean that we need to do a colonoscopy to look for polyps. I will place colonoscopy referral for digestive health in [place]. Let me know if you want orders sent anywhere else.

## 2022-02-26 DIAGNOSIS — F411 Generalized anxiety disorder: Secondary | ICD-10-CM | POA: Diagnosis not present

## 2022-03-12 DIAGNOSIS — F411 Generalized anxiety disorder: Secondary | ICD-10-CM | POA: Diagnosis not present

## 2022-03-31 ENCOUNTER — Other Ambulatory Visit: Payer: Self-pay | Admitting: Physician Assistant

## 2022-03-31 DIAGNOSIS — I10 Essential (primary) hypertension: Secondary | ICD-10-CM

## 2022-04-08 DIAGNOSIS — F411 Generalized anxiety disorder: Secondary | ICD-10-CM | POA: Diagnosis not present

## 2022-05-07 DIAGNOSIS — F411 Generalized anxiety disorder: Secondary | ICD-10-CM | POA: Diagnosis not present

## 2022-05-09 DIAGNOSIS — F3342 Major depressive disorder, recurrent, in full remission: Secondary | ICD-10-CM | POA: Diagnosis not present

## 2022-05-09 DIAGNOSIS — F411 Generalized anxiety disorder: Secondary | ICD-10-CM | POA: Diagnosis not present

## 2022-05-20 DIAGNOSIS — N951 Menopausal and female climacteric states: Secondary | ICD-10-CM | POA: Diagnosis not present

## 2022-06-16 DIAGNOSIS — N841 Polyp of cervix uteri: Secondary | ICD-10-CM | POA: Diagnosis not present

## 2022-06-16 DIAGNOSIS — N951 Menopausal and female climacteric states: Secondary | ICD-10-CM | POA: Diagnosis not present

## 2022-06-16 DIAGNOSIS — Z01411 Encounter for gynecological examination (general) (routine) with abnormal findings: Secondary | ICD-10-CM | POA: Diagnosis not present

## 2022-06-16 DIAGNOSIS — Z1151 Encounter for screening for human papillomavirus (HPV): Secondary | ICD-10-CM | POA: Diagnosis not present

## 2022-06-16 DIAGNOSIS — Z124 Encounter for screening for malignant neoplasm of cervix: Secondary | ICD-10-CM | POA: Diagnosis not present

## 2022-06-16 DIAGNOSIS — Z6824 Body mass index (BMI) 24.0-24.9, adult: Secondary | ICD-10-CM | POA: Diagnosis not present

## 2022-06-28 ENCOUNTER — Other Ambulatory Visit: Payer: Self-pay | Admitting: Physician Assistant

## 2022-06-28 DIAGNOSIS — I10 Essential (primary) hypertension: Secondary | ICD-10-CM

## 2022-09-03 DIAGNOSIS — F3342 Major depressive disorder, recurrent, in full remission: Secondary | ICD-10-CM | POA: Diagnosis not present

## 2022-09-03 DIAGNOSIS — F411 Generalized anxiety disorder: Secondary | ICD-10-CM | POA: Diagnosis not present

## 2022-10-01 ENCOUNTER — Other Ambulatory Visit: Payer: Self-pay | Admitting: Physician Assistant

## 2022-10-01 DIAGNOSIS — I10 Essential (primary) hypertension: Secondary | ICD-10-CM

## 2022-10-18 ENCOUNTER — Encounter: Payer: Self-pay | Admitting: Emergency Medicine

## 2022-10-18 ENCOUNTER — Other Ambulatory Visit: Payer: Self-pay

## 2022-10-18 ENCOUNTER — Ambulatory Visit
Admission: EM | Admit: 2022-10-18 | Discharge: 2022-10-18 | Disposition: A | Discharge status: Home / Self Care | Payer: BC Managed Care – PPO

## 2022-10-18 DIAGNOSIS — S0501XA Injury of conjunctiva and corneal abrasion without foreign body, right eye, initial encounter: Secondary | ICD-10-CM | POA: Diagnosis not present

## 2022-10-18 DIAGNOSIS — S0591XA Unspecified injury of right eye and orbit, initial encounter: Secondary | ICD-10-CM

## 2022-10-18 MED ORDER — ERYTHROMYCIN 5 MG/GM OP OINT: TOPICAL_OINTMENT | OPHTHALMIC | 0 refills | Status: AC

## 2022-10-18 MED ORDER — ACETAMINOPHEN 325 MG PO TABS
650.0000 mg | ORAL_TABLET | Freq: Once | ORAL | Status: AC
  Administered 2022-10-18: 650 mg via ORAL

## 2022-10-18 NOTE — ED Triage Notes (Addendum)
30 min pta  pt had a a metal plant stake go into her right eye  Pt states eye is burning- pt has an ice bag to right eye Small red area noted to sclera on the outside corner  Visual acuity not done pt unable to keep right open  Here w/ husband

## 2022-10-18 NOTE — ED Provider Notes (Signed)
[facility]    CSN: [id number] Arrival date & time: 10/18/22  1122      History   Chief Complaint Chief Complaint  Patient presents with   Eye Injury    right    HPI Darlene Ochoa is a 57 y.o. female.   HPI Pleasant 57 year old female presents with right eye injury.  Reports metal plant stake went into her right eye roughly 30 minutes ago.  Patient reports right eye is burning and noticed small red area on the right out side quarter of right eye.  Patient is accompanied by her husband today. PMH significant for chest pain, anxiety, and HTN.  Past Medical History:  Diagnosis Date   Anxiety    Chest pain 03/31/2012   Depression    H/O prolonged Q-T interval on ECG 03/31/2012   Hypertension    Major depressive disorder 03/31/2012   Seasonal allergies     Patient Active Problem List   Diagnosis Date Noted   Elevated LDL cholesterol level 09/11/2020   Right Achilles tendinitis 08/22/2020   Right lumbar radiculitis 04/04/2020   Impingement syndrome of right shoulder 03/28/2019   Primary osteoarthritis of both first carpometacarpal joints 07/29/2018   Tendonitis of both wrists 06/01/2018   Numbness and tingling in both hands 06/01/2018   Angio-edema 08/10/2017   Environmental allergies 07/21/2017   Seasonal allergic rhinitis 07/09/2017   Post-nasal drip 07/09/2017   Vitamin D deficiency 08/01/2014   B12 deficiency 08/01/2014   Prolonged Q-T interval on ECG 10/06/2013   Generalized anxiety disorder 12/27/2012   Anxiety 04/27/2012   Essential hypertension 04/27/2012   Major depressive disorder 03/31/2012   Migraine 03/31/2012    Past Surgical History:  Procedure Laterality Date   CESAREAN SECTION  1998   ROTATOR CUFF REPAIR Right 2021    OB History   No obstetric history on file.      Home Medications    Prior to Admission medications   Medication Sig Start Date End Date Taking? Authorizing Provider  estradiol (ESTRACE) 1 MG tablet Take 1 tablet  every day by oral route for 90 days. 06/16/22  Yes [provider]  progesterone (PROMETRIUM) 100 MG capsule Take 1 capsule every day by oral route for 90 days. 06/16/22  Yes [provider]  albuterol (VENTOLIN HFA) 108 (90 Base) MCG/ACT inhaler Inhale 2 puffs into the lungs every 4 (four) hours as needed for wheezing. Patient not taking: Reported on 10/18/2022 05/03/21   Jomarie Longs, PA-C  buPROPion (WELLBUTRIN XL) 150 MG 24 hr tablet Take 150 mg by mouth daily. 11/25/16   [provider]  buPROPion (WELLBUTRIN XL) 300 MG 24 hr tablet Take 300 mg by mouth daily. 10/28/16   [provider]  cholecalciferol (VITAMIN D3) 25 MCG (1000 UNIT) tablet Take 1,000 Units by mouth daily.    [provider]  diclofenac Sodium (VOLTAREN) 1 % GEL APPLY 4 G TOPICALLY 4 (FOUR) TIMES DAILY. TO AFFECTED JOINT. 09/27/19   Tandy Gaw L, PA-C  erythromycin ophthalmic ointment Place 0.5 inch ribbon of ointment into the right lower eyelid 4 times daily for the next 3-5 days. 10/18/22  Yes Trevor Iha, FNP  FLUoxetine (PROZAC) 20 MG capsule Take 60 mg by mouth every morning. 02/23/20   [provider]  hydrochlorothiazide (HYDRODIURIL) 25 MG tablet TAKE 1 TABLET EVERY DAY 10/01/22   Breeback, Jade L, PA-C  hydrOXYzine (ATARAX) 25 MG tablet Take 1 tablet (25 mg total) by mouth every 6 (six) hours  as needed for itching. Patient not taking: Reported on 10/18/2022 09/28/21   Guy Sandifer L, PA  loratadine (CLARITIN) 10 MG tablet Take 10 mg by mouth daily.    [provider]  meloxicam (MOBIC) 15 MG tablet Take 1 tablet (15 mg total) by mouth daily. 01/01/22   Breeback, Jade L, PA-C  QUEtiapine (SEROQUEL) 25 MG tablet Take 25 mg by mouth at bedtime. 03/24/20   [provider]  SUMAtriptan (IMITREX) 50 MG tablet Take 1 tablet (50 mg total) by mouth every 2 (two) hours as needed for migraine. May repeat in 2 hours if headache persists or recurs. 01/01/22    Breeback, Jade L, PA-C  triamcinolone cream (KENALOG) 0.1 % Apply 1 application  topically 2 (two) times daily. Patient not taking: Reported on 10/18/2022 09/28/21   Maretta Bees, PA    Family History Family History  Problem Relation Age of Onset   Diabetes Mother    Cancer Mother        Breast   Depression Mother    CAD Mother    Heart failure Father    Stroke Father    Alcohol abuse Father    Alcohol abuse Brother    Drug abuse Brother     Social History Social History   Tobacco Use   Smoking status: Never   Smokeless tobacco: Never  Vaping Use   Vaping Use: Never used  Substance Use Topics   Alcohol use: No   Drug use: No     Allergies   Cefuroxime, Lactose, Milk (cow), Peanut oil, Peanut-containing drug products, Pork allergy, Doxycycline, Lactose intolerance (gi), Sulfa antibiotics, Trazodone and nefazodone, Codeine, Penicillins, and Sulfonamide derivatives   Review of Systems Review of Systems  Eyes:  Positive for pain.  All other systems reviewed and are negative.    Physical Exam Triage Vital Signs ED Triage Vitals  Enc Vitals Group     BP 10/18/22 1140 136/86     Pulse Rate 10/18/22 1140 75     Resp 10/18/22 1140 16     Temp 10/18/22 1140 98.5 F (36.9 C)     Temp Source 10/18/22 1140 Oral     SpO2 10/18/22 1140 100 %     Weight 10/18/22 1142 143 lb (64.9 kg)     Height 10/18/22 1142 5\' 5"  (1.651 m)     Head Circumference --      Peak Flow --      Pain Score 10/18/22 1142 5     Pain Loc --      Pain Edu? --      Excl. in GC? --    No data found.  Updated Vital Signs BP 136/86 (BP Location: Left Arm)   Pulse 75   Temp 98.5 F (36.9 C) (Oral)   Resp 16   Ht 5\' 5"  (1.651 m)   Wt 143 lb (64.9 kg)   LMP 06/18/2017 (Approximate)   SpO2 100%   BMI 23.80 kg/m    Physical Exam Vitals and nursing note reviewed.  Constitutional:      Appearance: Normal appearance. She is normal weight.  HENT:     Head: Normocephalic and atraumatic.      Mouth/Throat:     Mouth: Mucous membranes are moist.     Pharynx: Oropharynx is clear.  Eyes:     Extraocular Movements: Extraocular movements intact.     Conjunctiva/sclera: Conjunctivae normal.     Pupils: Pupils are equal, round, and reactive to light.  Comments: Right eye irrigated with saline eyewash followed by 4 drops of tetracaine hydrochlorothiazide 0.5% ophthalmic solution into right eye: Right sclera: Pinpoint dot (corneal abrasion) at 9 o'clock position; right eye irrigated once again with saline eyewash.  Patient tolerated procedure well and noted decrease in pain and burning.  Cardiovascular:     Rate and Rhythm: Normal rate and regular rhythm.     Pulses: Normal pulses.     Heart sounds: Normal heart sounds.  Pulmonary:     Effort: Pulmonary effort is normal.     Breath sounds: Normal breath sounds. No wheezing, rhonchi or rales.  Musculoskeletal:        General: Normal range of motion.     Cervical back: Normal range of motion and neck supple.  Skin:    General: Skin is warm and dry.  Neurological:     General: No focal deficit present.     Mental Status: She is alert and oriented to person, place, and time. Mental status is at baseline.  Psychiatric:        Mood and Affect: Mood normal.        Behavior: Behavior normal.      UC Treatments / Results  Labs (all labs ordered are listed, but only abnormal results are displayed) Labs Reviewed - No data to display  EKG   Radiology No results found.  Procedures Procedures (including critical care time)  Medications Ordered in UC Medications  acetaminophen (TYLENOL) tablet 650 mg (650 mg Oral Given 10/18/22 1148)    Initial Impression / Assessment and Plan / UC Course  I have reviewed the triage vital signs and the nursing notes.  Pertinent labs & imaging results that were available during my care of the patient were reviewed by me and considered in my medical decision making (see chart for  details).     MDM: 1.  Abrasion of right cornea, initial encounter right eye injury, initial encounter-Rx'd erythromycin ophthalmic ointment: Place 0.5 inch ribbon into right lower eyelid 4 times daily for the next 3 to 5 days. 2.  Right eye injury, initial encounter-please see physical exam note for brief eye procedure to evaluate right eye pain.  Rx'd erythromycin ophthalmic ointment: Place 0.5 inch ribbon into right lower lid 4 times daily for the next 3 to 5 days.  Advised patient if symptoms worsen please follow-up with your optometry/ophthalmology for further evaluation. Advised patient to instill ophthalmic ointment as directed 1.25 cm ribbon 4 times daily for the next 3 to 5 days.  Advised patient if right eye pain worsens please follow-up with your optometry/ophthalmology for further evaluation.  Patient discharged home, hemodynamically stable.   Final Clinical Impressions(s) / UC Diagnoses   Final diagnoses:  Right eye injury, initial encounter  Abrasion of right cornea, initial encounter     Discharge Instructions      Advised patient to instill ophthalmic ointment as directed.  0.5 inch ribbon 4 times daily for the next 3 to 5 days.  Advised patient if right eye pain worsens please follow-up with your optometry/ophthalmology for further evaluation.     ED Prescriptions     Medication Sig Dispense Auth. Provider   erythromycin ophthalmic ointment Place 0.5 inch ribbon of ointment into the right lower eyelid 4 times daily for the next 3-5 days. 3.5 g Trevor Iha, FNP      PDMP not reviewed this encounter.   Trevor Iha, FNP 10/18/22 1220

## 2022-10-18 NOTE — Discharge Instructions (Addendum)
Advised patient to instill ophthalmic ointment as directed.  0.5 inch ribbon 4 times daily for the next 3 to 5 days.  Advised patient if right eye pain worsens please follow-up with your optometry/ophthalmology for further evaluation.

## 2022-10-29 ENCOUNTER — Ambulatory Visit: Payer: BC Managed Care – PPO

## 2022-10-29 DIAGNOSIS — Z1231 Encounter for screening mammogram for malignant neoplasm of breast: Secondary | ICD-10-CM | POA: Diagnosis not present

## 2022-10-30 NOTE — Progress Notes (Signed)
Normal mammogram. Follow up in 1 year.

## 2022-11-24 ENCOUNTER — Ambulatory Visit (INDEPENDENT_AMBULATORY_CARE_PROVIDER_SITE_OTHER): Payer: BC Managed Care – PPO | Admitting: Behavioral Health

## 2022-11-24 DIAGNOSIS — F411 Generalized anxiety disorder: Secondary | ICD-10-CM

## 2022-11-24 NOTE — Progress Notes (Signed)
                Darlene Ochoa M Deloyce Walthers, LCMHC 

## 2022-11-24 NOTE — Progress Notes (Signed)
[facility] Counselor Initial Adult Exam  Name: Darlene Ochoa Date: 11/24/2022 MRN: [id number] DOB: 1965-08-02 PCP: Darlene Longs, PA-C  Time spent: 60 minutes, 8 AM until 9 AM face-to-face with the patient in the outpatient therapy office  Guardian/Payee:  self    Paperwork requested: No   Reason for Visit /Presenting Problem: anxiety  Mental Status Exam: Appearance:   Well Groomed     Behavior:  Appropriate  Motor:  Normal  Speech/Language:   Normal Rate  Affect:  Appropriate  Mood:  normal  Thought process:  normal  Thought content:    WNL  Sensory/Perceptual disturbances:    WNL  Orientation:  oriented to person, place, time/date, situation, day of week, and month of year  Attention:  Good  Concentration:  Good  Memory:  WNL  Fund of knowledge:   Good  Insight:    Good  Judgment:   Good  Impulse Control:  Good    Reported Symptoms: Anxiety/stress  "Darlene Ochoa" is a 57 year old married female who presents with symptoms of anxiety and stress.  I had seen this patient at a previous clinic but had not seen her in about 6 months.  She has been married to her husband Darlene Ochoa for 30+ years.  She has 4 adult children, 2 grand daughters and 1 grandchild on the way.  Her husband works full-time and has been in [employer] for a lengthy amount of time.  He travels for work and is gone a lot of weeks from Sunday and Monday until about Thursday.  The patient has a part-time business in an [employer] but keeps her granddaughter who will be 3 in a couple of months about 3 days a week currently.  Her oldest daughter, daughter's boyfriend and 51-month-old daughter live in the home with the patient.  Her daughters have for the most part been in the home with she and her husband over the past several years.  The daughter currently living with her is working on housing that is taking some time.  That is adding to the patient's stress.  In addition, the patient and her husband bought a  house in May of this year somewhat suddenly so they are currently in transition.  They are living in the new home would still have things to get out of the new home as do their daughter's so there is some short-term financial stress because they are making to mortgage payments.  There will be some relief when they can get the house emptied out and have a yard sale and be able to list their home for sale.  Because her husband is gone so much during the week and she watches her granddaughter she has limited time to do that and says she can feel herself getting overwhelmed.  She had some kind of virus last week so she fell a little bit behind.  We looked at compartmentalization and what she can do within that time and limitations that she has.  We also talked about setting very clear boundaries with her daughters and getting their things out of their old home.  The patient grew up with her biological parents as well as a brother.  She had a very difficult relationship with her biological mother as well as brother.  She had a good relationship with her father and he died when she was younger.  Her mother died several years ago but had been living in another state with the patient's aunt.  The patient  brought her back here to try to help take care of her and that made some difficulty in relationship between the patient and her mother.  She states that her brother made a lot of poor decisions but the mother always favored him so he got all the attention growing up.  She has a casual relationship with her brother who lives in another state.  She has a good relationship with her husband's parents but her father-in-law has dementia and so she checks on him on a regular basis as well as her husband's mother.  She does report some history of ups and downs in relationship with her husband but says that they are in a good place now.  He has a job that is very stressful and he has to travel a lot.  She has a good relationship  with all of her children but right now her sons are fairly stable and there is some stress with her daughters and them finding their way in life.  The daughter who lives with her just had a little girl when she had some postpartum depression and is adjusting to being a mom and the patient feels some of that stress also.  She has a history of significant anxiety and depression but over the past 4 to 5 years has been stable on medication.  She currently sees Dr. Marnette Burgess at [facility] for medication management.  She does feel that her medications are in a good place.  There was a period for 5 years ago where her anxiety was debilitating.  She had a difficult time going out.  Her anxiety and panic were marked by racing thoughts, feeling overwhelmed, tightness in her stomach and her jaws.  We have worked on multiple coping skills over the years which she uses with success.  She is now stays active with her granddaughter.  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  She loves to garden with flowers and vegetables.  She and her husband bought a camper less than a year ago and they travel in that when they can as a way to get away.  They have not been able to do much that recently because of the current moving situation into their new home and getting the old home ready to sell.  The patient has no history of self harm or self harm thoughts.  She contracts for safety having no suicidal or homicidal ideation.  For the most part she reports fairly good sleep.  She reports a healthy diet since she and her husband took a fair amount when he is home but otherwise she tries to be as healthy as she can.  She drinks a lot of water. Risk Assessment: Danger to Self:  No Self-injurious Behavior: No Danger to Others: No Duty to Warn:no Physical Aggression / Violence:No  Access to Firearms a concern: No  Gang Involvement:No  Patient / guardian was educated about steps to take if suicide or  homicide risk level increases between visits: n/a While future psychiatric events cannot be accurately predicted, the patient does not currently require acute inpatient psychiatric care and does not currently meet [facility] involuntary commitment criteria.  Substance Abuse History: Current substance abuse: No     Past Psychiatric History:   History of anxiety and depression.  I had seen this patient in a previous clinic. Outpatient Providers: Dr. Marnette Burgess, Primary care physician History of Psych Hospitalization: No  Psychological Testing:  n/a  Abuse History:  Victim of: No.,  Patient reports a poor relationship with her biological mother growing up.    Report needed: No. Victim of Neglect:No. Perpetrator of  n/a   Witness / Exposure to Domestic Violence: None reported  Protective Services Involvement: No  Witness to [employer] Violence:  No   Family History:  Family History  Problem Relation Age of Onset   Diabetes Mother    Cancer Mother        Breast   Depression Mother    CAD Mother    Heart failure Father    Stroke Father    Alcohol abuse Father    Alcohol abuse Brother    Drug abuse Brother     Living situation: the patient lives with their spouse  Sexual Orientation: Straight  Relationship Status: married  Name of spouse / other: Darlene Ochoa If a parent, number of children / ages: The patient has 4 adult children, 2 granddaughters and 1 grandchild on the way.  Support Systems: spouse friends Environmental health practitioner Stress:  No   Income/Employment/Disability: Husband is employed, the patient has a part-time Passenger transport manager: No   Educational History: Education: high school diploma/GED  Religion/Sprituality/World View: Protestant  Any cultural differences that may affect / interfere with treatment:  not applicable   Recreation/Hobbies: Gardening, making crafts, the patient has a boot and an Metallurgist  Stressors: Marital or family conflict     Strengths: Supportive Relationships, Spirituality, Hopefulness, Journalist, newspaper, and Able to Communicate Effectively  Barriers:     Legal History: Pending legal issue / charges: The patient has no significant history of legal issues. History of legal issue / charges:  n/a  Medical History/Surgical History: reviewed Past Medical History:  Diagnosis Date   Anxiety    Chest pain 03/31/2012   Depression    H/O prolonged Q-T interval on ECG 03/31/2012   Hypertension    Major depressive disorder 03/31/2012   Seasonal allergies     Past Surgical History:  Procedure Laterality Date   CESAREAN SECTION  1998   ROTATOR CUFF REPAIR Right 2021    Medications: Current Outpatient Medications  Medication Sig Dispense Refill   albuterol (VENTOLIN HFA) 108 (90 Base) MCG/ACT inhaler Inhale 2 puffs into the lungs every 4 (four) hours as needed for wheezing. (Patient not taking: Reported on 10/18/2022) 1 each 0   buPROPion (WELLBUTRIN XL) 150 MG 24 hr tablet Take 150 mg by mouth daily.     buPROPion (WELLBUTRIN XL) 300 MG 24 hr tablet Take 300 mg by mouth daily.     cholecalciferol (VITAMIN D3) 25 MCG (1000 UNIT) tablet Take 1,000 Units by mouth daily.     diclofenac Sodium (VOLTAREN) 1 % GEL APPLY 4 G TOPICALLY 4 (FOUR) TIMES DAILY. TO AFFECTED JOINT. 100 g 1   erythromycin ophthalmic ointment Place 0.5 inch ribbon of ointment into the right lower eyelid 4 times daily for the next 3-5 days. 3.5 g 0   estradiol (ESTRACE) 1 MG tablet Take 1 tablet every day by oral route for 90 days.     FLUoxetine (PROZAC) 20 MG capsule Take 60 mg by mouth every morning.     hydrochlorothiazide (HYDRODIURIL) 25 MG tablet TAKE 1 TABLET EVERY DAY 90 tablet 0   hydrOXYzine (ATARAX) 25 MG tablet Take 1 tablet (25 mg total) by mouth every 6 (six) hours as needed for itching. (Patient not taking: Reported on 10/18/2022) 20 tablet 0   loratadine (CLARITIN) 10 MG tablet Take  10 mg by mouth daily.     meloxicam (MOBIC) 15  MG tablet Take 1 tablet (15 mg total) by mouth daily. 90 tablet 3   progesterone (PROMETRIUM) 100 MG capsule Take 1 capsule every day by oral route for 90 days.     QUEtiapine (SEROQUEL) 25 MG tablet Take 25 mg by mouth at bedtime.     SUMAtriptan (IMITREX) 50 MG tablet Take 1 tablet (50 mg total) by mouth every 2 (two) hours as needed for migraine. May repeat in 2 hours if headache persists or recurs. 12 tablet 0   triamcinolone cream (KENALOG) 0.1 % Apply 1 application  topically 2 (two) times daily. (Patient not taking: Reported on 10/18/2022) 15 g 0   No current facility-administered medications for this visit.    Allergies  Allergen Reactions   Cefuroxime Swelling   Lactose Nausea And Vomiting   Milk (Cow) Nausea And Vomiting   Peanut Oil Other (See Comments)   Peanut-Containing Drug Products Rash   Pork Allergy Nausea And Vomiting   Doxycycline Nausea And Vomiting   Lactose Intolerance (Gi)    Sulfa Antibiotics     Other reaction(s): Other (See Comments)   Trazodone And Nefazodone     Prolonged QTC   Codeine Rash   Penicillins Rash   Sulfonamide Derivatives Rash    Diagnoses:  Generalized anxiety disorder  Plan of Care: I will meet with the patient every 1 to 2 weeks in office.  Goals are to reduce anxiety and stress levels.  A formal treatment plan will be built into the session note for the next session.   [name], [facility]

## 2022-12-15 ENCOUNTER — Ambulatory Visit (INDEPENDENT_AMBULATORY_CARE_PROVIDER_SITE_OTHER): Payer: BC Managed Care – PPO | Admitting: Behavioral Health

## 2022-12-15 ENCOUNTER — Encounter: Payer: Self-pay | Admitting: Behavioral Health

## 2022-12-15 DIAGNOSIS — F411 Generalized anxiety disorder: Secondary | ICD-10-CM | POA: Diagnosis not present

## 2022-12-15 NOTE — Progress Notes (Signed)
                Craig M Peters, LCMHC 

## 2022-12-15 NOTE — Progress Notes (Signed)
[facility] Counselor/Therapist Progress Note  Patient ID: Berline Easterday, MRN: [id number],    Date: 12/15/2022  Time Spent: 34 minutes, 2:00 PM to 2:34 PM spent in person with the patient in the outpatient therapist office.  Treatment Type: Individual Therapy  Reported Symptoms: Anxiety/stress  Mental Status Exam: Appearance:  Well Groomed     Behavior: Appropriate  Motor: Normal  Speech/Language:  Clear and Coherent  Affect: Appropriate  Mood: normal  Thought process: normal  Thought content:   WNL  Sensory/Perceptual disturbances:   WNL  Orientation: oriented to person, place, time/date, situation, day of week, month of year, and year  Attention: Good  Concentration: Good  Memory: WNL  Fund of knowledge:  Good  Insight:   Good  Judgment:  Good  Impulse Control: Good   Risk Assessment: Danger to Self:  No Self-injurious Behavior: No Danger to Others: No Duty to Warn:no Physical Aggression / Violence:No  Access to Firearms a concern: No  Gang Involvement:No   Subjective: The patient says that a lot going on and trying to get everything moved out of their old home into her new 1.  Both of her daughters have things there and she had to have a various conversation with her oldest daughter this morning about setting a deadline for getting all of her things out so they can put the house up for sale.  The daughter is waiting on housing to open up and should see that happen in the next few weeks so we talked about the importance of setting a very firm boundaries with the deadline.  She is going to have the same conversation with her younger daughter.  The patient is fairly stressed out because she keeps her oldest granddaughter 2 to 3 days/week and is now keeping her 49-month-old granddaughter until her daughter can work out other babysitting arrangements that she has gone back to work.  There was a family meeting recently about some things that needed to change in terms of  clear boundaries which the patient plans to act on.  About 35 minutes into the session the patient got a phone call from her husband that their dog had gotten out of their fence.  She was clearly anxious and asked if we could end the session.  I reminded her of her coping skills for stress and anxiety.  Interventions: Cognitive Behavioral Therapy  Diagnosis: Generalized anxiety disorder Treatment plan: Goals will be to use cognitive behavioral therapy as well as elements of dialectical behavior reduce the patient's anxiety by at least 50% with a target date of June 19, 2023.  Goals will be to improve her ability to better manage stress and anxiety symptoms, identify and process causes for her anxiety and stress as well as resolving or conflicts contributing to the anxiety.  Lastly we will help her manage thoughts and worrisome thinking contributing to feelings of anxiety and stress.  Interventions will include educating the patient about anxiety so that she can see its causes symptoms and triggers.  We will provide problem solution skills to help her identify options for resolving stress as well as coping skills such as biofeedback, guided imagery etc. to manage stress and anxiety.  We will use cognitive behavioral therapy to identify and change anxiety provoking thought behavior patterns as well as dialectical behavior therapy to teach distress tolerance and mindfulness skills. Plan: I will meet with the patient every 2 to 3 weeks in person.  [name], [facility]

## 2022-12-17 ENCOUNTER — Other Ambulatory Visit: Payer: Self-pay | Admitting: Physician Assistant

## 2022-12-17 DIAGNOSIS — R200 Anesthesia of skin: Secondary | ICD-10-CM

## 2022-12-17 DIAGNOSIS — M7661 Achilles tendinitis, right leg: Secondary | ICD-10-CM

## 2022-12-17 DIAGNOSIS — M5416 Radiculopathy, lumbar region: Secondary | ICD-10-CM

## 2022-12-17 DIAGNOSIS — M180 Bilateral primary osteoarthritis of first carpometacarpal joints: Secondary | ICD-10-CM

## 2022-12-29 ENCOUNTER — Encounter: Payer: Self-pay | Admitting: Behavioral Health

## 2022-12-29 ENCOUNTER — Ambulatory Visit (INDEPENDENT_AMBULATORY_CARE_PROVIDER_SITE_OTHER): Payer: BC Managed Care – PPO | Admitting: Behavioral Health

## 2022-12-29 DIAGNOSIS — F411 Generalized anxiety disorder: Secondary | ICD-10-CM

## 2022-12-29 NOTE — Progress Notes (Signed)
[facility] Counselor/Therapist Progress Note  Patient ID: Darlene Ochoa, MRN: [id number],    Date: 12/29/2022  Time Spent: 53 minutes, 8:00 a.m. to 8:53 a.m. spent in person with the patient in the outpatient therapist office.  Treatment Type: Individual Therapy  Reported Symptoms: Anxiety/stress  Mental Status Exam: Appearance:  Well Groomed     Behavior: Appropriate  Motor: Normal  Speech/Language:  Clear and Coherent  Affect: Appropriate  Mood: normal  Thought process: normal  Thought content:   WNL  Sensory/Perceptual disturbances:   WNL  Orientation: oriented to person, place, time/date, situation, day of week, month of year, and year  Attention: Good  Concentration: Good  Memory: WNL  Fund of knowledge:  Good  Insight:   Good  Judgment:  Good  Impulse Control: Good   Risk Assessment: Danger to Self:  No Self-injurious Behavior: No Danger to Others: No Duty to Warn:no Physical Aggression / Violence:No  Access to Firearms a concern: No  Gang Involvement:No   Subjective: The patient stress right now is trying to balance keeping up with both of her grandchildren 3 days a week for the most part, cleaning out the home that they are getting ready to sell and maintain the house that she is in.  She is at the old house every their second that she gets as is her husband when he is not working.  Both of her daughters have gotten some things out but still have work to do.  They have reached out to a realtor and she feels that setting a deadline for listing the home will bring some relief as will getting enlisted and selling it.  She is looking at again stages which is good.  We talked about the importance in this case of compartmentalization to reduce anxiety and stress.  She has done about as much she can do and there are a few other structural/cosmetic things to do but she can see the end in sight.  For the most part other things have settled down somewhat in her  children's lives.  She does have another grandchild due from her son in October but she is excited about that.  I encouraged her to find time even if in small segments to step away for self-care.  She and her husband are talking about taking the camper to go to the mountains this fall which I strongly encouraged her to do.  She does contract for safety having no thoughts of hurting herself or anyone else.  Interventions: Cognitive Behavioral Therapy  Diagnosis: Generalized anxiety disorder Treatment plan: Goals will be to use cognitive behavioral therapy as well as elements of dialectical behavior reduce the patient's anxiety by at least 50% with a target date of June 19, 2023.  Goals will be to improve her ability to better manage stress and anxiety symptoms, identify and process causes for her anxiety and stress as well as resolving or conflicts contributing to the anxiety.  Lastly we will help her manage thoughts and worrisome thinking contributing to feelings of anxiety and stress.  Interventions will include educating the patient about anxiety so that she can see its causes symptoms and triggers.  We will provide problem solution skills to help her identify options for resolving stress as well as coping skills such as biofeedback, guided imagery etc. to manage stress and anxiety.  We will use cognitive behavioral therapy to identify and change anxiety provoking thought behavior patterns as well as dialectical behavior therapy to teach distress tolerance  and mindfulness skills. Progress: 30% Plan: I will meet with the patient every 2 to 3 weeks in person.  [name], [facility]                  [name], [facility]

## 2022-12-31 ENCOUNTER — Other Ambulatory Visit: Payer: Self-pay | Admitting: Physician Assistant

## 2022-12-31 DIAGNOSIS — I10 Essential (primary) hypertension: Secondary | ICD-10-CM

## 2023-01-05 ENCOUNTER — Encounter: Payer: Self-pay | Admitting: Behavioral Health

## 2023-01-05 ENCOUNTER — Ambulatory Visit: Payer: BC Managed Care – PPO | Admitting: Behavioral Health

## 2023-01-05 DIAGNOSIS — F411 Generalized anxiety disorder: Secondary | ICD-10-CM | POA: Diagnosis not present

## 2023-01-05 NOTE — Progress Notes (Signed)
[facility] Counselor/Therapist Progress Note  Patient ID: Darlene Ochoa, MRN: [id number],    Date: 01/05/2023  Time Spent: 57 minutes, 8:00 a.m. to 8:57 a.m. spent in person with the patient in the outpatient therapist office.  Treatment Type: Individual Therapy  Reported Symptoms: Anxiety/stress  Mental Status Exam: Appearance:  Well Groomed     Behavior: Appropriate  Motor: Normal  Speech/Language:  Clear and Coherent  Affect: Appropriate  Mood: normal  Thought process: normal  Thought content:   WNL  Sensory/Perceptual disturbances:   WNL  Orientation: oriented to person, place, time/date, situation, day of week, month of year, and year  Attention: Good  Concentration: Good  Memory: WNL  Fund of knowledge:  Good  Insight:   Good  Judgment:  Good  Impulse Control: Good   Risk Assessment: Danger to Self:  No Self-injurious Behavior: No Danger to Others: No Duty to Warn:no Physical Aggression / Violence:No  Access to Firearms a concern: No  Gang Involvement:No   Subjective: There has been some settling in all that the patient has to do but is still busy.  He met with a realtor to get her house listed and she recommended taking some rooms so the patient is actively working on that.  She worked a lot this week and is going to work today and hopes to finish soon.  She said she is getting to the point where she knows that she is doing all that she can and is using good cognitive reframing to tell herself that as soon as she gets everything done it will be listed and feels that it was so easily.  Things have stabilized more with family so some of her anxiety has gone down.  She is able to begin to settle a little more at home including getting some of her things out that she enjoys doing such as her crafts.  She has not actually worked on them yet but can see that happening in the fairly near future.  She had some good conversations with family members and setting  healthy boundaries.  She does still report moderate anxiety but feels that she is doing all that she can to manage that and is using coping skills. She does contract for safety having no thoughts of hurting herself or anyone else.  Interventions: Cognitive Behavioral Therapy  Diagnosis: Generalized anxiety disorder Treatment plan: Goals will be to use cognitive behavioral therapy as well as elements of dialectical behavior reduce the patient's anxiety by at least 50% with a target date of June 19, 2023.  Goals will be to improve her ability to better manage stress and anxiety symptoms, identify and process causes for her anxiety and stress as well as resolving or conflicts contributing to the anxiety.  Lastly we will help her manage thoughts and worrisome thinking contributing to feelings of anxiety and stress.  Interventions will include educating the patient about anxiety so that she can see its causes symptoms and triggers.  We will provide problem solution skills to help her identify options for resolving stress as well as coping skills such as biofeedback, guided imagery etc. to manage stress and anxiety.  We will use cognitive behavioral therapy to identify and change anxiety provoking thought behavior patterns as well as dialectical behavior therapy to teach distress tolerance and mindfulness skills. Progress: 30% Plan: I will meet with the patient every 2 to 3 weeks in person.  [name], [facility]  [name], [facility]               [name], [facility]

## 2023-01-12 ENCOUNTER — Encounter: Payer: Self-pay | Admitting: Behavioral Health

## 2023-01-12 ENCOUNTER — Ambulatory Visit: Payer: BC Managed Care – PPO | Admitting: Physician Assistant

## 2023-01-12 ENCOUNTER — Ambulatory Visit (INDEPENDENT_AMBULATORY_CARE_PROVIDER_SITE_OTHER): Payer: BC Managed Care – PPO | Admitting: Behavioral Health

## 2023-01-12 VITALS — BP 143/82 | HR 71 | Ht 65.0 in | Wt 140.0 lb

## 2023-01-12 DIAGNOSIS — R202 Paresthesia of skin: Secondary | ICD-10-CM

## 2023-01-12 DIAGNOSIS — F411 Generalized anxiety disorder: Secondary | ICD-10-CM | POA: Diagnosis not present

## 2023-01-12 DIAGNOSIS — R200 Anesthesia of skin: Secondary | ICD-10-CM

## 2023-01-12 DIAGNOSIS — M79631 Pain in right forearm: Secondary | ICD-10-CM

## 2023-01-12 MED ORDER — GABAPENTIN 100 MG PO CAPS: ORAL_CAPSULE | ORAL | 0 refills | Status: DC

## 2023-01-12 NOTE — Progress Notes (Unsigned)
Acute Office Visit  Subjective:     Patient ID: Darlene Ochoa, female    DOB: Aug 09, 1965, 57 y.o.   MRN: [id number]  Chief Complaint  Patient presents with   Arm Pain    HPI Patient is a 57 year old female in today for right forearm and hand pain. Patient states that her symptoms have been chronic but she's noticed an increase over the past month. Patient complains of increasing pain, frequently dropping objects, and numbness and tingling in the 4th and 5th fingers of her right hand. Patient points to pain in her right forearm as being just below her lateral epicondyle. She is currently taking meloxicam for her bilateral CMC joint arthritis but is not taking any other medications for her arm/hand pain. Denies any known injury. Patient also presents with a contracture at the PIP of her right fifth finger. NKI and denies any pain to palpation but does complain of decreased ROM and use of the finger.   .. Active Ambulatory Problems    Diagnosis Date Noted   Major depressive disorder 03/31/2012   Migraine 03/31/2012   Anxiety 04/27/2012   Essential hypertension 04/27/2012   Generalized anxiety disorder 12/27/2012   Prolonged Q-T interval on ECG 10/06/2013   Vitamin D deficiency 08/01/2014   B12 deficiency 08/01/2014   Seasonal allergic rhinitis 07/09/2017   Post-nasal drip 07/09/2017   Environmental allergies 07/21/2017   Angio-edema 08/10/2017   Tendonitis of both wrists 06/01/2018   Numbness and tingling of right arm 06/01/2018   Primary osteoarthritis of both first carpometacarpal joints 07/29/2018   Impingement syndrome of right shoulder 03/28/2019   Right lumbar radiculitis 04/04/2020   Right Achilles tendinitis 08/22/2020   Elevated LDL cholesterol level 09/11/2020   Right forearm pain 01/13/2023   Numbness of fingers 01/13/2023   Resolved Ambulatory Problems    Diagnosis Date Noted   Palpitations 08/02/2009   Chest pain 03/31/2012   Shortness of breath 03/31/2012    Major depressive disorder, recurrent episode, moderate (HCC) 12/27/2012   Trapezius muscle spasm 08/21/2016   Acute pain of left shoulder 08/21/2016   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     Objective:    BP 138/82   Pulse 71   Ht 5\' 5"  (1.651 m)   Wt 140 lb (63.5 kg)   LMP 06/18/2017 (Approximate)   SpO2 99%   BMI 23.30 kg/m  BP Readings from Last 3 Encounters:  01/12/23 138/82  10/18/22 136/86  01/01/22 136/79   Wt Readings from Last 3 Encounters:  01/12/23 140 lb (63.5 kg)  10/18/22 143 lb (64.9 kg)  01/01/22 144 lb (65.3 kg)      Physical Exam Tenderness to palpation over right radial forearm just below the lateral epicondyle. Pain with supination to pronation Hand grip 4/5 No redness, bruising, warmth, swelling  Negative phalens and tinels  Right pinky finger with bony fusion nodule of PIP No palpation of tendon in palm of hand      Assessment & Plan:  Marland KitchenMarland KitchenLuray was seen today for arm pain.  Diagnoses and all orders for this visit:  Right forearm pain -     gabapentin (  NEURONTIN) 100 MG capsule; One tab PO qHS for a week, then BID for a week, then TID.  Numbness and tingling of right arm -     gabapentin (NEURONTIN) 100 MG capsule; One tab PO qHS for a week, then BID for a week, then TID.  Numbness of fingers -     gabapentin (NEURONTIN) 100 MG capsule; One tab PO qHS for a week, then BID for a week, then TID.     Start gabapentin for numbness, tingling, pain in the fingers Continue Mobic that she is already taking Discussed side effects Seems to be tender over extensor carpi radialis brevis Start wearing tennis brace on right forearm  to help relieve load and symptoms of right arm Apply voltaren gel 4 times daily to the affected area near the right lateral epicondyle Ice twice daily to help with pain relief and inflammation reduction Rest as much as you can to reduce inflammation and stress on the tendon Consider EMGs to assess nerve status and MRI for evaluation of tendons of the right forearm Consider Dr. Karie Schwalbe for evaluation

## 2023-01-12 NOTE — Patient Instructions (Addendum)
GET TENNIS BRACE AND WEAR ALL THE TIME   USE VOLTAREN GEL 4 TIMES A DAY ICE TWICE A DAY REST AS MUCH AS YOU CAN  CONSIDER MRI AND EMGS  Tennis Elbow  Tennis elbow (lateral epicondylitis) is inflammation of tendons in your outer forearm, near your elbow. Tendons are tissues that connect muscle to bone. When you have tennis elbow, inflammation affects the tendons that you use to bend your wrist and move your hand up. Inflammation occurs in the lower part of the upper arm bone (humerus), where the tendons connect to the bone (lateral epicondyle). Tennis elbow often affects people who play tennis, but anyone may get the condition from repeatedly extending the wrist or turning the forearm. What are the causes? This condition is usually caused by repeatedly extending the wrist, turning the forearm, and using the hands. It can result from sports or work that requires repetitive forearm movements. In some cases, it may be caused by a sudden injury. What increases the risk? You are more likely to develop tennis elbow if you play tennis or another racket sport. You also have a higher risk if you frequently use your hands for work. Besides people who play tennis, others at greater risk include: People who use computers. Holiday representative workers. People who work in [employer]. Musicians. Cooks. Cashiers. What are the signs or symptoms? Symptoms of this condition include: Pain and tenderness in the forearm and the outer part of the elbow. Pain may be felt only when using the arm, or it may be there all the time. A burning feeling that starts in the elbow and spreads down the forearm. A weak grip in the hand. How is this diagnosed? This condition is diagnosed based on your symptoms, your medical history, and a physical exam. You may also have X-rays or an MRI to: Confirm the diagnosis. Look for other issues. Check for tears in the ligaments, muscles, or tendons. How is this treated? Resting and icing  your arm is often the first treatment. Your health care provider may also recommend: Medicines to reduce pain and inflammation. These may be in the form of a pill, topical gels, or shots of a steroid medicine (cortisone). An elbow strap to reduce stress on the area. Physical therapy. This may include massage or exercises or both. An elbow brace to restrict the movements that cause symptoms. If these treatments do not help relieve your symptoms, your health care provider may recommend surgery to remove damaged muscle and reattach healthy muscle to bone. Follow these instructions at home: If you have a brace or strap: Wear the brace or strap as told by your health care provider. Remove it only as told by your health care provider. Check the skin around the brace or strap every day. Tell your health care provider about any concerns. Loosen the brace if your fingers tingle, become numb, or turn cold and blue. Keep the brace clean. If the brace or strap is not waterproof: Do not let it get wet. Cover it with a watertight covering when you take a bath or a shower. Managing pain, stiffness, and swelling  If directed, put ice on the injured area. To do this: If you have a removable brace or strap, remove it as told by your health care provider. Put ice in a plastic bag. Place a towel between your skin and the bag. Leave the ice on for 20 minutes, 2-3 times a day. Remove the ice if your skin turns bright red. This is  very important. If you cannot feel pain, heat, or cold, you have a greater risk of damage to the area. Move your fingers often to reduce stiffness and swelling. Activity Rest your elbow and wrist and avoid activities that cause symptoms as told by your health care provider. Do physical therapy exercises as told by your health care provider. If you lift an object, lift it with your palm facing up. This reduces stress on your elbow. Lifestyle If your tennis elbow is caused by sports,  check your equipment and make sure that: You use it correctly. It is good match for you. If your tennis elbow is caused by work or computer use, take frequent breaks to stretch your arm. Talk with your employer about ways to manage your condition at work. General instructions Take over-the-counter and prescription medicines only as told by your health care provider. Do not use any products that contain nicotine or tobacco. These products include cigarettes, chewing tobacco, and vaping devices, such as e-cigarettes. If you need help quitting, ask your health care provider. Keep all follow-up visits. This is important. How is this prevented? Before and after activity: Warm up and stretch before being active. Cool down and stretch after being active. Give your body time to rest between periods of activity. During activity: Make sure to use equipment that fits you. If you play tennis, put power in your stroke with your lower body. Avoid using your arm only. Maintain physical fitness, including: Strength. Flexibility. Endurance. Do exercises to strengthen the forearm muscles. Contact a health care provider if: You have pain that gets worse or does not get better with treatment. You have numbness or weakness in your forearm, hand, or fingers. Get help right away if: Your pain is severe. You cannot move your wrist. Summary Tennis elbow (lateral epicondylitis) is inflammation of tendons in your outer forearm, near your elbow. Common symptoms include pain and tenderness in your forearm and the outer part of your elbow. This condition is usually caused by repeatedly extending your wrist, turning your forearm, and using your hands. The first treatment is often resting and icing your arm to relieve symptoms. Further treatment may include taking medicine, getting physical therapy, wearing a brace or strap, or having surgery. This information is not intended to replace advice given to you by your  health care provider. Make sure you discuss any questions you have with your health care provider. Document Revised: 10/18/2019 Document Reviewed: 10/18/2019 Elsevier Patient Education  2024 [employer].

## 2023-01-12 NOTE — Progress Notes (Signed)
[facility] Counselor/Therapist Progress Note  Patient ID: Shaquail Leishman, MRN: [id number],    Date: 01/12/2023  Time Spent: 49 minutes, 8:00 a.m. to 8:49 a.m. spent in person with the patient in the outpatient therapist office.  Treatment Type: Individual Therapy  Reported Symptoms: Anxiety/stress  Mental Status Exam: Appearance:  Well Groomed     Behavior: Appropriate  Motor: Normal  Speech/Language:  Clear and Coherent  Affect: Appropriate  Mood: normal  Thought process: normal  Thought content:   WNL  Sensory/Perceptual disturbances:   WNL  Orientation: oriented to person, place, time/date, situation, day of week, month of year, and year  Attention: Good  Concentration: Good  Memory: WNL  Fund of knowledge:  Good  Insight:   Good  Judgment:  Good  Impulse Control: Good   Risk Assessment: Danger to Self:  No Self-injurious Behavior: No Danger to Others: No Duty to Warn:no Physical Aggression / Violence:No  Access to Firearms a concern: No  Gang Involvement:No   Subjective: There have been several things which has helped take some of the strain off of the patient.  Her daughter will get settled in her new home very soon.  They have decided to do a few static touches up to the house they are selling and have started and it looks good.  Anticipate having that done in a couple of weeks and then they can put that house up for sale.  She is getting their new home back in order and is feeling better.  There are still some family issues but she knows that she can only do so much especially in a supportive role.  Her son's mother-in-law did pass away from cancer over the weekend so she is supporting her son and daughter-in-law and granddaughter through a very difficult time.  The patient feels that even as busy as she is her anxiety is manageable because she can see an end for several things that were up in the air.  She continues to practice good self-care.  She is  medication compliant.  She does contract for safety having no thoughts of hurting herself or anyone else.  Interventions: Cognitive Behavioral Therapy  Diagnosis: Generalized anxiety disorder Treatment plan: Goals will be to use cognitive behavioral therapy as well as elements of dialectical behavior reduce the patient's anxiety by at least 50% with a target date of June 19, 2023.  Goals will be to improve her ability to better manage stress and anxiety symptoms, identify and process causes for her anxiety and stress as well as resolving or conflicts contributing to the anxiety.  Lastly we will help her manage thoughts and worrisome thinking contributing to feelings of anxiety and stress.  Interventions will include educating the patient about anxiety so that she can see its causes symptoms and triggers.  We will provide problem solution skills to help her identify options for resolving stress as well as coping skills such as biofeedback, guided imagery etc. to manage stress and anxiety.  We will use cognitive behavioral therapy to identify and change anxiety provoking thought behavior patterns as well as dialectical behavior therapy to teach distress tolerance and mindfulness skills. Progress: 30% Plan: I will meet with the patient every 2 to 3 weeks in person.  [name], [facility]                  [name], [facility]               [name],  [facility]               [name], [facility]

## 2023-01-13 ENCOUNTER — Encounter: Payer: Self-pay | Admitting: Physician Assistant

## 2023-01-13 DIAGNOSIS — R200 Anesthesia of skin: Secondary | ICD-10-CM | POA: Insufficient documentation

## 2023-01-13 DIAGNOSIS — M79631 Pain in right forearm: Secondary | ICD-10-CM | POA: Insufficient documentation

## 2023-01-19 ENCOUNTER — Ambulatory Visit: Payer: BC Managed Care – PPO | Admitting: Behavioral Health

## 2023-02-02 ENCOUNTER — Encounter: Payer: Self-pay | Admitting: Behavioral Health

## 2023-02-02 ENCOUNTER — Ambulatory Visit: Payer: BC Managed Care – PPO | Admitting: Behavioral Health

## 2023-02-02 ENCOUNTER — Telehealth: Payer: Self-pay | Admitting: Physician Assistant

## 2023-02-02 DIAGNOSIS — F411 Generalized anxiety disorder: Secondary | ICD-10-CM | POA: Diagnosis not present

## 2023-02-02 DIAGNOSIS — I10 Essential (primary) hypertension: Secondary | ICD-10-CM

## 2023-02-02 MED ORDER — HYDROCHLOROTHIAZIDE 25 MG PO TABS: 25.0000 mg | ORAL_TABLET | Freq: Every day | ORAL | 0 refills | Status: DC

## 2023-02-02 NOTE — Progress Notes (Signed)
[facility] Counselor/Therapist Progress Note  Patient ID: Darlene Ochoa, MRN: [id number],    Date: 02/02/2023  Time Spent: 55 minutes, 8:00 a.m. to 8:55 a.m. spent in person with the patient in the outpatient therapist office.  Treatment Type: Individual Therapy  Reported Symptoms: Anxiety/stress  Mental Status Exam: Appearance:  Well Groomed     Behavior: Appropriate  Motor: Normal  Speech/Language:  Clear and Coherent  Affect: Appropriate  Mood: normal  Thought process: normal  Thought content:   WNL  Sensory/Perceptual disturbances:   WNL  Orientation: oriented to person, place, time/date, situation, day of week, month of year, and year  Attention: Good  Concentration: Good  Memory: WNL  Fund of knowledge:  Good  Insight:   Good  Judgment:  Good  Impulse Control: Good   Risk Assessment: Danger to Self:  No Self-injurious Behavior: No Danger to Others: No Duty to Warn:no Physical Aggression / Violence:No  Access to Firearms a concern: No  Gang Involvement:No   Subjective: There were a lot of things going on for the patient.  She and her husband decided to do a little bit more extensive work to the house they are getting ready to sell so that is taking a lot of her free time.  She recognizes that she needs some time also today she is not going to work on the house and does not have a grandchild to watch so encouraged her to take a self-care day which she is going to do.  There are some family concerns which we processed as we talked about the importance of setting some very clear boundaries which she has been doing a much better job of.  She is having some very assertive but respectful conversations as a part of those boundaries.  She has not had a lot of time to do things that she likes to do so there is a new craft that she wants to work on and plans to start that this afternoon to be consistent with that.  For the most part she feels she is handling stress  fairly well but encouraged consistent use of her coping skills as well as interest which are calming for her knowing that they are completing 1 house to get ready to sell and trying to get another 1 to set up.  She does contract for safety having no thoughts of hurting herself or anyone else.  Interventions: Cognitive Behavioral Therapy  Diagnosis: Generalized anxiety disorder Treatment plan: Goals will be to use cognitive behavioral therapy as well as elements of dialectical behavior reduce the patient's anxiety by at least 50% with a target date of June 19, 2023.  Goals will be to improve her ability to better manage stress and anxiety symptoms, identify and process causes for her anxiety and stress as well as resolving or conflicts contributing to the anxiety.  Lastly we will help her manage thoughts and worrisome thinking contributing to feelings of anxiety and stress.  Interventions will include educating the patient about anxiety so that she can see its causes symptoms and triggers.  We will provide problem solution skills to help her identify options for resolving stress as well as coping skills such as biofeedback, guided imagery etc. to manage stress and anxiety.  We will use cognitive behavioral therapy to identify and change anxiety provoking thought behavior patterns as well as dialectical behavior therapy to teach distress tolerance and mindfulness skills. Progress: 30% Plan: I will meet with the patient every 2 to 3  weeks in person.  [name], [facility]                  [name], [facility]               [name], [facility]               [name], [facility]               [name], [facility]

## 2023-02-02 NOTE — Telephone Encounter (Signed)
Refill sent.

## 2023-02-02 NOTE — Telephone Encounter (Signed)
Pt forgot to let Lesly Rubenstein know during her last appt that she needs a refill on Hydrochlorothiazide sent [facility] on [address]

## 2023-02-16 ENCOUNTER — Ambulatory Visit: Payer: BC Managed Care – PPO | Admitting: Behavioral Health

## 2023-03-09 ENCOUNTER — Ambulatory Visit: Payer: BC Managed Care – PPO | Admitting: Behavioral Health

## 2023-03-09 ENCOUNTER — Encounter: Payer: Self-pay | Admitting: Behavioral Health

## 2023-03-09 DIAGNOSIS — F411 Generalized anxiety disorder: Secondary | ICD-10-CM

## 2023-03-09 NOTE — Progress Notes (Signed)
[facility] Counselor/Therapist Progress Note  Patient ID: Darlene Ochoa, MRN: [id number],    Date: 03/09/2023  Time Spent: 56 minutes, 8:00 a.m. to 8:56 a.m. spent in person with the patient in the outpatient therapist office.  Treatment Type: Individual Therapy  Reported Symptoms: Anxiety/stress  Mental Status Exam: Appearance:  Well Groomed     Behavior: Appropriate  Motor: Normal  Speech/Language:  Clear and Coherent  Affect: Appropriate  Mood: normal  Thought process: normal  Thought content:   WNL  Sensory/Perceptual disturbances:   WNL  Orientation: oriented to person, place, time/date, situation, day of week, month of year, and year  Attention: Good  Concentration: Good  Memory: WNL  Fund of knowledge:  Good  Insight:   Good  Judgment:  Good  Impulse Control: Good   Risk Assessment: Danger to Self:  No Self-injurious Behavior: No Danger to Others: No Duty to Warn:no Physical Aggression / Violence:No  Access to Firearms a concern: No  Gang Involvement:No   Subjective: There were a lot of things going on for the patient.  They are just about finishing where he doing the house that they were living in so they can get that on the market.  They hope to finish this week.  That will take a lot of stress off of the patient and her husband.  There are some other things that are creating significant anxiety and stress with the patient primarily related to family members.  There family members who have not being respectful of boundaries so we looked at options for the patient to set some even firmer boundaries so that she can better care for herself.  She does not like to feel like she is being mean but reminded her that she has given various family members multiple chances to actively engage and participate in family things that need to be done and they have not taken positive steps in that direction.  We talked about hard conversations including she and her husband  with other family members to see if that makes a difference as well as some other very firm boundaries.  The patient does not have much time for self-care as we talked about the little things that she could do until she could begin to enforce the boundaries and see the results of that. She does contract for safety having no thoughts of hurting herself or anyone else.  Interventions: Cognitive Behavioral Therapy  Diagnosis: Generalized anxiety disorder Treatment plan: Goals will be to use cognitive behavioral therapy as well as elements of dialectical behavior reduce the patient's anxiety by at least 50% with a target date of June 19, 2023.  Goals will be to improve her ability to better manage stress and anxiety symptoms, identify and process causes for her anxiety and stress as well as resolving or conflicts contributing to the anxiety.  Lastly we will help her manage thoughts and worrisome thinking contributing to feelings of anxiety and stress.  Interventions will include educating the patient about anxiety so that she can see its causes symptoms and triggers.  We will provide problem solution skills to help her identify options for resolving stress as well as coping skills such as biofeedback, guided imagery etc. to manage stress and anxiety.  We will use cognitive behavioral therapy to identify and change anxiety provoking thought behavior patterns as well as dialectical behavior therapy to teach distress tolerance and mindfulness skills. Progress: 30% Plan: I will meet with the patient every 2 to 3 weeks in  person.  [name], [facility]                  [name], [facility]               [name], [facility]               [name], [facility]               [name], [facility]               [name], [facility]

## 2023-03-16 ENCOUNTER — Ambulatory Visit: Payer: BC Managed Care – PPO | Admitting: Behavioral Health

## 2023-03-23 ENCOUNTER — Ambulatory Visit: Payer: BC Managed Care – PPO | Admitting: Behavioral Health

## 2023-03-23 ENCOUNTER — Encounter: Payer: Self-pay | Admitting: Behavioral Health

## 2023-03-23 DIAGNOSIS — F411 Generalized anxiety disorder: Secondary | ICD-10-CM

## 2023-03-23 NOTE — Progress Notes (Signed)
[facility] Counselor/Therapist Progress Note  Patient ID: Darlene Ochoa, MRN: [id number],    Date: 03/23/2023  Time Spent: 50 minutes, 2 PM until 2:50 PM. spent in person with the patient in the outpatient therapist office.  Treatment Type: Individual Therapy  Reported Symptoms: Anxiety/stress  Mental Status Exam: Appearance:  Well Groomed     Behavior: Appropriate  Motor: Normal  Speech/Language:  Clear and Coherent  Affect: Appropriate  Mood: normal  Thought process: normal  Thought content:   WNL  Sensory/Perceptual disturbances:   WNL  Orientation: oriented to person, place, time/date, situation, day of week, month of year, and year  Attention: Good  Concentration: Good  Memory: WNL  Fund of knowledge:  Good  Insight:   Good  Judgment:  Good  Impulse Control: Good   Risk Assessment: Danger to Self:  No Self-injurious Behavior: No Danger to Others: No Duty to Warn:no Physical Aggression / Violence:No  Access to Firearms a concern: No  Gang Involvement:No   Subjective: There has been some reduction in stress with the patient especially with the old house that they are fixing up to sell.  She and her husband got a lot done over the weekend and are within a few days of getting it to the point where they think they can get a listed.  Just knowing there that close has taken significant stress off of the patient.  For the most part her daughters have gotten their things out of the house.  There is still some stress with certain family members but we talked about the importance of self-care and setting healthy boundaries with those family members.  She does report her stress level is down significantly and I encouraged her to get back to those things which bring her some release and joy.  She does contract for safety having no thoughts of hurting herself or anyone else.  Interventions: Cognitive Behavioral Therapy  Diagnosis: Generalized anxiety  disorder Treatment plan: Goals will be to use cognitive behavioral therapy as well as elements of dialectical behavior reduce the patient's anxiety by at least 50% with a target date of June 19, 2023.  Goals will be to improve her ability to better manage stress and anxiety symptoms, identify and process causes for her anxiety and stress as well as resolving or conflicts contributing to the anxiety.  Lastly we will help her manage thoughts and worrisome thinking contributing to feelings of anxiety and stress.  Interventions will include educating the patient about anxiety so that she can see its causes symptoms and triggers.  We will provide problem solution skills to help her identify options for resolving stress as well as coping skills such as biofeedback, guided imagery etc. to manage stress and anxiety.  We will use cognitive behavioral therapy to identify and change anxiety provoking thought behavior patterns as well as dialectical behavior therapy to teach distress tolerance and mindfulness skills. Progress: 30% Plan: I will meet with the patient every 2 to 3 weeks in person.  [name], [facility]                  [name], [facility]               [name], [facility]               [name], [facility]               [name], [facility]  [name], [facility]               [name], [facility]

## 2023-04-06 ENCOUNTER — Ambulatory Visit: Payer: BC Managed Care – PPO | Admitting: Behavioral Health

## 2023-04-06 ENCOUNTER — Encounter: Payer: Self-pay | Admitting: Behavioral Health

## 2023-04-06 DIAGNOSIS — F411 Generalized anxiety disorder: Secondary | ICD-10-CM

## 2023-04-06 NOTE — Progress Notes (Signed)
[facility] Counselor/Therapist Progress Note  Patient ID: Darlene Ochoa, MRN: [id number],    Date: 04/06/2023  Time Spent: 8 AM until 8:58 AM, 58 minutes  spent in person with the patient in the outpatient therapist office.  Treatment Type: Individual Therapy  Reported Symptoms: Anxiety/stress  Mental Status Exam: Appearance:  Well Groomed     Behavior: Appropriate  Motor: Normal  Speech/Language:  Clear and Coherent  Affect: Appropriate  Mood: normal  Thought process: normal  Thought content:   WNL  Sensory/Perceptual disturbances:   WNL  Orientation: oriented to person, place, time/date, situation, day of week, month of year, and year  Attention: Good  Concentration: Good  Memory: WNL  Fund of knowledge:  Good  Insight:   Good  Judgment:  Good  Impulse Control: Good   Risk Assessment: Danger to Self:  No Self-injurious Behavior: No Danger to Others: No Duty to Warn:no Physical Aggression / Violence:No  Access to Firearms a concern: No  Gang Involvement:No   Subjective: Things have started to settle somewhat.  They are all but finished with completing the house that they want to move out of.  The anticipate listing it after the first of the year.  There have been some changes in family structure due to the patient setting some very firm boundaries but she knows she needs to continue to set some boundaries continuously maintain those boundaries for her sake.  She anticipates having a little more time for self-care now beginning a house is done.  She is looking forward to a busy but family oriented time for Christmas. She does contract for safety having no thoughts of hurting herself or anyone else.  Interventions: Cognitive Behavioral Therapy  Diagnosis: Generalized anxiety disorder Treatment plan: Goals will be to use cognitive behavioral therapy as well as elements of dialectical behavior reduce the patient's anxiety by at least 50% with a target date of  June 19, 2023.  Goals will be to improve her ability to better manage stress and anxiety symptoms, identify and process causes for her anxiety and stress as well as resolving or conflicts contributing to the anxiety.  Lastly we will help her manage thoughts and worrisome thinking contributing to feelings of anxiety and stress.  Interventions will include educating the patient about anxiety so that she can see its causes symptoms and triggers.  We will provide problem solution skills to help her identify options for resolving stress as well as coping skills such as biofeedback, guided imagery etc. to manage stress and anxiety.  We will use cognitive behavioral therapy to identify and change anxiety provoking thought behavior patterns as well as dialectical behavior therapy to teach distress tolerance and mindfulness skills. Progress: 30% Plan: I will meet with the patient every 2 to 3 weeks in person.  [name], [facility]                  [name], [facility]               [name], [facility]               [name], [facility]               [name], [facility]               [name], [facility]               [name], [facility]  [name], [facility]

## 2023-04-20 ENCOUNTER — Ambulatory Visit: Payer: BC Managed Care – PPO | Admitting: Sports Medicine

## 2023-04-20 VITALS — BP 118/73 | HR 77 | Ht 65.0 in | Wt 138.0 lb

## 2023-04-20 DIAGNOSIS — R399 Unspecified symptoms and signs involving the genitourinary system: Secondary | ICD-10-CM | POA: Diagnosis not present

## 2023-04-20 DIAGNOSIS — N3090 Cystitis, unspecified without hematuria: Secondary | ICD-10-CM | POA: Diagnosis not present

## 2023-04-20 LAB — POCT URINALYSIS DIP (CLINITEK)
Bilirubin, UA: NEGATIVE
Blood, UA: NEGATIVE
Glucose, UA: 100 mg/dL — AB
Ketones, POC UA: NEGATIVE mg/dL
Leukocytes, UA: NEGATIVE
Nitrite, UA: POSITIVE — AB
Spec Grav, UA: >=1.03 — AB (ref 1.010–1.025)
Urobilinogen, UA: 1 U/dL
pH, UA: 5.5 (ref 5.0–8.0)

## 2023-04-20 MED ORDER — FLUCONAZOLE 150 MG PO TABS
150.0000 mg | ORAL_TABLET | Freq: Once | ORAL | 3 refills | Status: AC
Start: 2023-04-20 — End: 2023-04-20

## 2023-04-20 MED ORDER — CIPROFLOXACIN HCL 750 MG PO TABS
750.0000 mg | ORAL_TABLET | Freq: Two times a day (BID) | ORAL | 0 refills | Status: AC
Start: 2023-04-20 — End: 2023-04-30

## 2023-04-20 NOTE — Assessment & Plan Note (Addendum)
Pleasant 57 year old female, history of UTIs in the past, the most recent positive culture did show extended spectrum beta-lactamase E. coli. Historically the ESBL E. coli lowest MIC was with Cipro. She is having increasing lower abdominal pain, dysuria, and some left costovertebral angle pain. Urinalysis today was positive for nitrites. She does have a urinary tract infection however due to the left costovertebral angle pain we will do a prolonged antibiotic duration of 10 days. Diflucan for potential yeast infection afterwards. Return to see me as needed. Awaiting culture results.

## 2023-04-20 NOTE — Progress Notes (Signed)
    Procedures performed today:    None.  Independent interpretation of notes and tests performed by another provider:   None.  Brief History, Exam, Impression, and Recommendations:    Cystitis Pleasant 57 year old female, history of UTIs in the past, the most recent positive culture did show extended spectrum beta-lactamase E. coli. Historically the ESBL E. coli lowest MIC was with Cipro. She is having increasing lower abdominal pain, dysuria, and some left costovertebral angle pain. Urinalysis today was positive for nitrites. She does have a urinary tract infection however due to the left costovertebral angle pain we will do a prolonged antibiotic duration of 10 days. Diflucan for potential yeast infection afterwards. Return to see me as needed. Awaiting culture results.    ____________________________________________ Ihor Austin. Benjamin Stain, M.D., ABFM., CAQSM., AME. Primary Care and Sports Medicine [facility]  Adjunct Professor of Family Medicine  [employer]  Restaurant manager, fast food

## 2023-04-23 LAB — URINE CULTURE

## 2023-04-27 ENCOUNTER — Ambulatory Visit: Payer: BC Managed Care – PPO | Admitting: Behavioral Health

## 2023-04-27 ENCOUNTER — Encounter: Payer: Self-pay | Admitting: Behavioral Health

## 2023-04-27 DIAGNOSIS — F411 Generalized anxiety disorder: Secondary | ICD-10-CM

## 2023-04-27 NOTE — Progress Notes (Signed)
 [facility] Counselor/Therapist Progress Note  Patient ID: Darlene Ochoa, MRN: [id number],    Date: 04/27/2023  Time Spent: 3:00-3:58 pm AM, 58 minutes  spent in person with the patient in the outpatient therapist office.  Treatment Type: Individual Therapy  Reported Symptoms: Anxiety/stress  Mental Status Exam: Appearance:  Well Groomed     Behavior: Appropriate  Motor: Normal  Speech/Language:  Clear and Coherent  Affect: Appropriate  Mood: normal  Thought process: normal  Thought content:   WNL  Sensory/Perceptual disturbances:   WNL  Orientation: oriented to person, place, time/date, situation, day of week, month of year, and year  Attention: Good  Concentration: Good  Memory: WNL  Fund of knowledge:  Good  Insight:   Good  Judgment:  Good  Impulse Control: Good   Risk Assessment: Danger to Self:  No Self-injurious Behavior: No Danger to Others: No Duty to Warn:no Physical Aggression / Violence:No  Access to Firearms a concern: No  Gang Involvement:No   Subjective: The patient reported that she and her family had a good holiday season.  Her husband was able to be home several weeks in a row so they were able to get their former house completed and get it on the market about 1 week ago.  She is thankful not just to have to had any responsibility over there and feels that it will sell fairly quickly.  She notes that will relieve a lot of her anxiety.  Most of the other anxiety is just about all that she has to do.  2 adult daughters are still in the home with her although she does not mind helping she knows it is time for them to find other living arrangements and we talked about different ways she could set a boundary with that.  She also has at least 1 or 2 of her granddaughters most days of the week and that is fairly stressful although she enjoys the time with them.  She recognizes right now there is not much she can do about that so we talked about different  ways she could find some peace in time for self-care in small segments in addition to her on the weekend when her husband is home.  Interventions: Cognitive Behavioral Therapy  Diagnosis: Generalized anxiety disorder Treatment plan: Goals will be to use cognitive behavioral therapy as well as elements of dialectical behavior reduce the patient's anxiety by at least 50% with a target date of June 19, 2023.  Goals will be to improve her ability to better manage stress and anxiety symptoms, identify and process causes for her anxiety and stress as well as resolving or conflicts contributing to the anxiety.  Lastly we will help her manage thoughts and worrisome thinking contributing to feelings of anxiety and stress.  Interventions will include educating the patient about anxiety so that she can see its causes symptoms and triggers.  We will provide problem solution skills to help her identify options for resolving stress as well as coping skills such as biofeedback, guided imagery etc. to manage stress and anxiety.  We will use cognitive behavioral therapy to identify and change anxiety provoking thought behavior patterns as well as dialectical behavior therapy to teach distress tolerance and mindfulness skills. Progress: 30% Plan: I will meet with the patient every 2 to 3 weeks in person.  Lorrene CHRISTELLA Hasten, [facility]                  Lorrene CHRISTELLA Hasten, [facility]  Lorrene CHRISTELLA Hasten, [facility]               Lorrene CHRISTELLA Hasten, [facility]               Lorrene CHRISTELLA Hasten, [facility]               Lorrene CHRISTELLA Hasten, [facility]               Lorrene CHRISTELLA Hasten, [facility]               Lorrene CHRISTELLA Hasten, [facility]               Lorrene CHRISTELLA Hasten, [facility]

## 2023-05-03 ENCOUNTER — Other Ambulatory Visit: Payer: Self-pay | Admitting: Physician Assistant

## 2023-05-03 DIAGNOSIS — I10 Essential (primary) hypertension: Secondary | ICD-10-CM

## 2023-05-04 ENCOUNTER — Ambulatory Visit: Payer: BC Managed Care – PPO | Admitting: Behavioral Health

## 2023-05-04 ENCOUNTER — Encounter: Payer: Self-pay | Admitting: Behavioral Health

## 2023-05-04 DIAGNOSIS — F411 Generalized anxiety disorder: Secondary | ICD-10-CM | POA: Diagnosis not present

## 2023-05-04 NOTE — Progress Notes (Signed)
 [facility] Counselor/Therapist Progress Note  Patient ID: Darlene Ochoa, MRN: [id number],    Date: 05/04/2023  Time Spent:    8:00 am to 8:54 am 54 minutes  spent in person with the patient in the outpatient therapist office.  Treatment Type: Individual Therapy  Reported Symptoms: Anxiety/stress  Mental Status Exam: Appearance:  Well Groomed     Behavior: Appropriate  Motor: Normal  Speech/Language:  Clear and Coherent  Affect: Appropriate  Mood: normal  Thought process: normal  Thought content:   WNL  Sensory/Perceptual disturbances:   WNL  Orientation: oriented to person, place, time/date, situation, day of week, month of year, and year  Attention: Good  Concentration: Good  Memory: WNL  Fund of knowledge:  Good  Insight:   Good  Judgment:  Good  Impulse Control: Good   Risk Assessment: Danger to Self:  No Self-injurious Behavior: No Danger to Others: No Duty to Warn:no Physical Aggression / Violence:No  Access to Firearms a concern: No  Gang Involvement:No   Subjective: The patient has had a few small sections of time where she could do something good for herself but she had to leave the home because of things that she would do including grafting she cannot get to right now.  Her 2 adult daughters and how they take care of the space they are living with at the patient's house are her biggest stressor.  She showed me pictures of the room that they share and it was extremely cluttered and disorganized.  She said they live close all over the floor.  The daughter with the child does not do a great job of picking up bottles cups etc.  She reports they have never been great in terms of keeping the room straight but has gotten worse even as adults.  She is talked to him about him multiple times send albeit brief improvement if that but then it goes right back to the same behaviors.  The patient feels that she is getting to the point where it is too much to handle and  we talked about different ways she could address that with him including making a list of the visually see what her frustrations are and what she expects to be different.  We talked about being firmly but respectfully assertive and addressing that with him including her husband as she needs to do that.  We also encouraged her to approach that from a financial perspective doing a little homework to see what the cost of living would be like and renting a place in if the 2 sisters could share of living space.  The patient says she is in a fairly new house and wants her space back and that is frustrating for her.  She does not mind helping watch the kids but can control her environment better if her adult daughters are not living there.  I did encourage continued use of coping skills to [place] with the stress as well as doing some things which are good for her in the times that she has available.  The patient does contract for safety having no thoughts of hurting herself or anyone else.  Interventions: Cognitive Behavioral Therapy  Diagnosis: Generalized anxiety disorder Treatment plan: Goals will be to use cognitive behavioral therapy as well as elements of dialectical behavior reduce the patient's anxiety by at least 50% with a target date of June 19, 2023.  Goals will be to improve her ability to better manage stress and anxiety symptoms,  identify and process causes for her anxiety and stress as well as resolving or conflicts contributing to the anxiety.  Lastly we will help her manage thoughts and worrisome thinking contributing to feelings of anxiety and stress.  Interventions will include educating the patient about anxiety so that she can see its causes symptoms and triggers.  We will provide problem solution skills to help her identify options for resolving stress as well as coping skills such as biofeedback, guided imagery etc. to manage stress and anxiety.  We will use cognitive behavioral therapy to  identify and change anxiety provoking thought behavior patterns as well as dialectical behavior therapy to teach distress tolerance and mindfulness skills. Progress: 35% Plan: I will meet with the patient every 2 to 3 weeks in person.  Lorrene CHRISTELLA Hasten, [facility]                  Lorrene CHRISTELLA Hasten, [facility]               Lorrene CHRISTELLA Hasten, [facility]               Lorrene CHRISTELLA Hasten, [facility]               Lorrene CHRISTELLA Hasten, [facility]               Lorrene CHRISTELLA Hasten, [facility]               Lorrene CHRISTELLA Hasten, [facility]               Lorrene CHRISTELLA Hasten, [facility]               Lorrene CHRISTELLA Hasten, [facility]               Lorrene CHRISTELLA Hasten, [facility]

## 2023-05-11 ENCOUNTER — Encounter: Payer: Self-pay | Admitting: Behavioral Health

## 2023-05-11 ENCOUNTER — Ambulatory Visit: Payer: BC Managed Care – PPO | Admitting: Behavioral Health

## 2023-05-11 DIAGNOSIS — F411 Generalized anxiety disorder: Secondary | ICD-10-CM

## 2023-05-11 NOTE — Progress Notes (Signed)
[facility] Counselor/Therapist Progress Note  Patient ID: Darlene Ochoa, MRN: [id number],    Date: 05/11/2023  Time Spent: 2 PM until 2:54 PM, 54 minutes  spent in person with the patient in the outpatient therapist office.  Treatment Type: Individual Therapy  Reported Symptoms: Anxiety/stress  Mental Status Exam: Appearance:  Well Groomed     Behavior: Appropriate  Motor: Normal  Speech/Language:  Clear and Coherent  Affect: Appropriate  Mood: normal  Thought process: normal  Thought content:   WNL  Sensory/Perceptual disturbances:   WNL  Orientation: oriented to person, place, time/date, situation, day of week, month of year, and year  Attention: Good  Concentration: Good  Memory: WNL  Fund of knowledge:  Good  Insight:   Good  Judgment:  Good  Impulse Control: Good   Risk Assessment: Danger to Self:  No Self-injurious Behavior: No Danger to Others: No Duty to Warn:no Physical Aggression / Violence:No  Access to Firearms a concern: No  Gang Involvement:No   Subjective: For the most part things have been fairly stable over the past week.  Her husband has been home so they have been able to get more done.  They did have an open house this past Sunday and several people saw the house so she remains optimistic.  She has not yet made a list that we talked about in the previous session but says she has several ideas what she wants to put on it when she goes home today after the session she and her husband are going to work on it and if possible presented to at least her oldest daughter if not both of her daughters.  We talked about being respectfully assertive and it being her house and if they are going to stay there having reasonable expectations and goals for them to live by and following up on those on the list.  She has done a few things which are good for her in terms of self-care and wants to do more of those. The patient does contract for safety having no  thoughts of hurting herself or anyone else.  Interventions: Cognitive Behavioral Therapy  Diagnosis: Generalized anxiety disorder Treatment plan: Goals will be to use cognitive behavioral therapy as well as elements of dialectical behavior reduce the patient's anxiety by at least 50% with a target date of June 19, 2023.  Goals will be to improve her ability to better manage stress and anxiety symptoms, identify and process causes for her anxiety and stress as well as resolving or conflicts contributing to the anxiety.  Lastly we will help her manage thoughts and worrisome thinking contributing to feelings of anxiety and stress.  Interventions will include educating the patient about anxiety so that she can see its causes symptoms and triggers.  We will provide problem solution skills to help her identify options for resolving stress as well as coping skills such as biofeedback, guided imagery etc. to manage stress and anxiety.  We will use cognitive behavioral therapy to identify and change anxiety provoking thought behavior patterns as well as dialectical behavior therapy to teach distress tolerance and mindfulness skills. Progress: 35% Plan: I will meet with the patient every 2 to 3 weeks in person.  [name], [facility]                  [name], [facility]               [name], [facility]  [name], [facility]               [name], [facility]               [name], [facility]               [name], [facility]               [name], [facility]               [name], [facility]               [name], [facility]               [name], [facility]

## 2023-05-14 DIAGNOSIS — F5105 Insomnia due to other mental disorder: Secondary | ICD-10-CM | POA: Diagnosis not present

## 2023-05-14 DIAGNOSIS — F3342 Major depressive disorder, recurrent, in full remission: Secondary | ICD-10-CM | POA: Diagnosis not present

## 2023-05-14 DIAGNOSIS — F411 Generalized anxiety disorder: Secondary | ICD-10-CM | POA: Diagnosis not present

## 2023-05-18 ENCOUNTER — Encounter: Payer: Self-pay | Admitting: Behavioral Health

## 2023-05-18 ENCOUNTER — Ambulatory Visit (INDEPENDENT_AMBULATORY_CARE_PROVIDER_SITE_OTHER): Payer: BC Managed Care – PPO | Admitting: Behavioral Health

## 2023-05-18 DIAGNOSIS — F411 Generalized anxiety disorder: Secondary | ICD-10-CM

## 2023-05-18 NOTE — Progress Notes (Signed)
[facility] Counselor/Therapist Progress Note  Patient ID: Jaquanda Wickersham, MRN: [id number],    Date: 05/18/2023  Time Spent: 8 AM until 8:55 AM, 55 minutes   spent in person with the patient in the outpatient therapist office.  Treatment Type: Individual Therapy  Reported Symptoms: Anxiety/stress  Mental Status Exam: Appearance:  Well Groomed     Behavior: Appropriate  Motor: Normal  Speech/Language:  Clear and Coherent  Affect: Appropriate  Mood: normal  Thought process: normal  Thought content:   WNL  Sensory/Perceptual disturbances:   WNL  Orientation: oriented to person, place, time/date, situation, day of week, month of year, and year  Attention: Good  Concentration: Good  Memory: WNL  Fund of knowledge:  Good  Insight:   Good  Judgment:  Good  Impulse Control: Good   Risk Assessment: Danger to Self:  No Self-injurious Behavior: No Danger to Others: No Duty to Warn:no Physical Aggression / Violence:No  Access to Firearms a concern: No  Gang Involvement:No   Subjective: We continued to work on the patient setting healthy boundaries with the ultimate goal of self-care.  We did talk about the difficulty especially with setting the family members that she has good relationships with and also being complicated by it being adult relationship with an adult child.  We talked about how she can be respectfully assertive and setting boundaries for ways that she can do that while at the same time practicing self-care and a very busy schedule.  I emphasized the importance of coping skills for keeping her stress and anxiety levels down.   The patient does contract for safety having no thoughts of hurting herself or anyone else.  Interventions: Cognitive Behavioral Therapy  Diagnosis: Generalized anxiety disorder Treatment plan: Goals will be to use cognitive behavioral therapy as well as elements of dialectical behavior reduce the patient's anxiety by at least 50%  with a target date of June 19, 2023.  Goals will be to improve her ability to better manage stress and anxiety symptoms, identify and process causes for her anxiety and stress as well as resolving or conflicts contributing to the anxiety.  Lastly we will help her manage thoughts and worrisome thinking contributing to feelings of anxiety and stress.  Interventions will include educating the patient about anxiety so that she can see its causes symptoms and triggers.  We will provide problem solution skills to help her identify options for resolving stress as well as coping skills such as biofeedback, guided imagery etc. to manage stress and anxiety.  We will use cognitive behavioral therapy to identify and change anxiety provoking thought behavior patterns as well as dialectical behavior therapy to teach distress tolerance and mindfulness skills. Progress: 35% Plan: I will meet with the patient every 2 to 3 weeks in person.  [name], [facility]                  [name], [facility]               [name], [facility]               [name], [facility]               [name], [facility]               [name], [facility]               [name], [facility]  [name], [facility]               [name], [facility]               [name], [facility]               [name], [facility]               [name], [facility]

## 2023-06-02 ENCOUNTER — Ambulatory Visit: Payer: BC Managed Care – PPO | Admitting: Behavioral Health

## 2023-06-08 ENCOUNTER — Ambulatory Visit (INDEPENDENT_AMBULATORY_CARE_PROVIDER_SITE_OTHER): Payer: BC Managed Care – PPO | Admitting: Behavioral Health

## 2023-06-08 ENCOUNTER — Encounter: Payer: Self-pay | Admitting: Behavioral Health

## 2023-06-08 DIAGNOSIS — F411 Generalized anxiety disorder: Secondary | ICD-10-CM | POA: Diagnosis not present

## 2023-06-08 NOTE — Progress Notes (Signed)
[facility] Counselor/Therapist Progress Note  Patient ID: Aalaya Yadao, MRN: [id number],    Date: 06/08/2023  Time Spent: 8 AM until 8:55 AM, 55 minutes   spent in person with the patient in the outpatient therapist office.  Treatment Type: Individual Therapy  Reported Symptoms: Anxiety/stress  Mental Status Exam: Appearance:  Well Groomed     Behavior: Appropriate  Motor: Normal  Speech/Language:  Clear and Coherent  Affect: Appropriate  Mood: normal  Thought process: normal  Thought content:   WNL  Sensory/Perceptual disturbances:   WNL  Orientation: oriented to person, place, time/date, situation, day of week, month of year, and year  Attention: Good  Concentration: Good  Memory: WNL  Fund of knowledge:  Good  Insight:   Good  Judgment:  Good  Impulse Control: Good   Risk Assessment: Danger to Self:  No Self-injurious Behavior: No Danger to Others: No Duty to Warn:no Physical Aggression / Violence:No  Access to Firearms a concern: No  Gang Involvement:No   Subjective: We continued to work on the patient setting healthy boundaries with the ultimate goal of self-care.  We worked on this specifically with a family member who is in her home.  We talked about the reasoning behind the importance of setting clear memory and where some of those things which keep her from setting clear boundaries.  She does not want to be seen as being angry hard to get along with and knows that is not her nature.  We talked about looking at the intense facilitating changes opposed to the method of delivering the message without change.  We also worked on Conservator, museum/gallery and role played how she might be less comfortable and having very difficult conversations and setting very clear boundaries. The patient does contract for safety having no thoughts of hurting herself or anyone else.  Interventions: Cognitive Behavioral Therapy  Diagnosis: Generalized anxiety  disorder Treatment plan: Goals will be to use cognitive behavioral therapy as well as elements of dialectical behavior reduce the patient's anxiety by at least 50% with a target date of June 19, 2023.  Goals will be to improve her ability to better manage stress and anxiety symptoms, identify and process causes for her anxiety and stress as well as resolving or conflicts contributing to the anxiety.  Lastly we will help her manage thoughts and worrisome thinking contributing to feelings of anxiety and stress.  Interventions will include educating the patient about anxiety so that she can see its causes symptoms and triggers.  We will provide problem solution skills to help her identify options for resolving stress as well as coping skills such as biofeedback, guided imagery etc. to manage stress and anxiety.  We will use cognitive behavioral therapy to identify and change anxiety provoking thought behavior patterns as well as dialectical behavior therapy to teach distress tolerance and mindfulness skills. Progress: 35% Plan: I will meet with the patient every 2 to 3 weeks in person.  [name], [facility]                  [name], [facility]               [name], [facility]               [name], [facility]               [name], [facility]  [name], [facility]               [name], [facility]               [name], [facility]               [name], [facility]               [name], [facility]               [name], [facility]               [name], [facility]  [facility] Counselor/Therapist Progress Note  Patient ID: Rebbie Lauricella, MRN: [id number],    Date: 06/08/2023  Time Spent: 8 AM until 8:55 AM, 55 minutes   spent in person with the patient in the outpatient therapist  office.  Treatment Type: Individual Therapy  Reported Symptoms: Anxiety/stress  Mental Status Exam: Appearance:  Well Groomed     Behavior: Appropriate  Motor: Normal  Speech/Language:  Clear and Coherent  Affect: Appropriate  Mood: normal  Thought process: normal  Thought content:   WNL  Sensory/Perceptual disturbances:   WNL  Orientation: oriented to person, place, time/date, situation, day of week, month of year, and year  Attention: Good  Concentration: Good  Memory: WNL  Fund of knowledge:  Good  Insight:   Good  Judgment:  Good  Impulse Control: Good   Risk Assessment: Danger to Self:  No Self-injurious Behavior: No Danger to Others: No Duty to Warn:no Physical Aggression / Violence:No  Access to Firearms a concern: No  Gang Involvement:No   Subjective: We continued to work on the patient setting healthy boundaries with the ultimate goal of self-care.  We did talk about the difficulty especially with setting the family members that she has good relationships with and also being complicated by it being adult relationship with an adult child.  We talked about how she can be respectfully assertive and setting boundaries for ways that she can do that while at the same time practicing self-care and a very busy schedule.  I emphasized the importance of coping skills for keeping her stress and anxiety levels down.   The patient does contract for safety having no thoughts of hurting herself or anyone else.  Interventions: Cognitive Behavioral Therapy  Diagnosis: Generalized anxiety disorder Treatment plan: Goals will be to use cognitive behavioral therapy as well as elements of dialectical behavior reduce the patient's anxiety by at least 50% with a target date of June 19, 2023.  Goals will be to improve her ability to better manage stress and anxiety symptoms, identify and process causes for her anxiety and stress as well as resolving or conflicts contributing to the  anxiety.  Lastly we will help her manage thoughts and worrisome thinking contributing to feelings of anxiety and stress.  Interventions will include educating the patient about anxiety so that she can see its causes symptoms and triggers.  We will provide problem solution skills to help her identify options for resolving stress as well as coping skills such as biofeedback, guided imagery etc. to manage stress and anxiety.  We will use cognitive behavioral therapy to identify and change anxiety provoking thought behavior patterns as well as dialectical behavior therapy to teach distress tolerance and mindfulness skills. Progress: 35% Plan: I will meet with the patient every 2 to 3 weeks in person.  [name], [facility]  [name], [facility]               [name], [facility]               [name], [facility]               [name], [facility]               [name], [facility]               [name], [facility]               [name], [facility]               [name], [facility]               [name], [facility]               [name], [facility]               [name], [facility]               [name], [facility]

## 2023-06-15 ENCOUNTER — Encounter: Payer: Self-pay | Admitting: Family Medicine

## 2023-06-15 ENCOUNTER — Ambulatory Visit: Payer: BC Managed Care – PPO | Admitting: Family Medicine

## 2023-06-15 VITALS — BP 134/75 | HR 85 | Ht 65.0 in | Wt 138.0 lb

## 2023-06-15 DIAGNOSIS — J019 Acute sinusitis, unspecified: Secondary | ICD-10-CM | POA: Diagnosis not present

## 2023-06-15 MED ORDER — CLARITHROMYCIN 500 MG PO TABS: 500.0000 mg | ORAL_TABLET | Freq: Two times a day (BID) | ORAL | 0 refills | Status: DC

## 2023-06-15 MED ORDER — LEVOFLOXACIN 500 MG PO TABS: 500.0000 mg | ORAL_TABLET | Freq: Every day | ORAL | 0 refills | Status: DC

## 2023-06-15 NOTE — Addendum Note (Signed)
 Addended by: Nani Gasser D on: 06/15/2023 03:20 PM   Modules accepted: Orders

## 2023-06-15 NOTE — Progress Notes (Signed)
   Acute Office Visit  Subjective:     Patient ID: Darlene Ochoa, female    DOB: Apr 01, 1966, 58 y.o.   MRN: [id number]  Chief Complaint  Patient presents with   Cough    HPI Patient is in today for cough and congestion x 9-10 days.  No fever.  She says she has a lot of congestion and drainage is always worse in the mornings.  It seems a little better during the daytime.  The sore throat is not as severe as it was its now mild.  No GI symptoms.  No fever in the last several days.  But she is getting a lot of thick mucus from her sinuses and when coughing especially in the mornings.  ROS      Objective:    BP 134/75   Pulse 85   Ht 5\' 5"  (1.651 m)   Wt 138 lb (62.6 kg)   LMP 06/18/2017 (Approximate)   SpO2 99%   BMI 22.96 kg/m    Physical Exam Constitutional:      Appearance: Normal appearance.  HENT:     Head: Normocephalic and atraumatic.     Right Ear: Tympanic membrane, ear canal and external ear normal. There is no impacted cerumen.     Left Ear: Tympanic membrane, ear canal and external ear normal. There is no impacted cerumen.     Nose: Nose normal.     Mouth/Throat:     Pharynx: Oropharynx is clear.  Eyes:     Conjunctiva/sclera: Conjunctivae normal.  Cardiovascular:     Rate and Rhythm: Normal rate and regular rhythm.  Pulmonary:     Effort: Pulmonary effort is normal.     Breath sounds: Normal breath sounds.  Musculoskeletal:     Cervical back: Neck supple. No tenderness.  Lymphadenopathy:     Cervical: No cervical adenopathy.  Skin:    General: Skin is warm and dry.  Neurological:     Mental Status: She is alert and oriented to person, place, and time.  Psychiatric:        Mood and Affect: Mood normal.     No results found for any visits on 06/15/23.      Assessment & Plan:   Problem List Items Addressed This Visit   None Visit Diagnoses       Acute non-recurrent sinusitis, unspecified location    -  Primary   Relevant Medications    levofloxacin (LEVAQUIN) 500 MG tablet      He reports she cannot take penicillin, sulfa or doxycycline which rules out her first and second line drug options.  So we will treat with a fluoroquinolone for 5 days.   Meds ordered this encounter  Medications   levofloxacin (LEVAQUIN) 500 MG tablet    Sig: Take 1 tablet (500 mg total) by mouth daily.    Dispense:  5 tablet    Refill:  0    No follow-ups on file.  Nani Gasser, MD

## 2023-06-15 NOTE — Patient Instructions (Addendum)
 Please do not take your quetiapine while you are on the Biaxin antibiotic.

## 2023-06-15 NOTE — Progress Notes (Signed)
 Pt reports that she has had cough x 1 week she has only been taking tylenol

## 2023-06-16 ENCOUNTER — Encounter: Payer: Self-pay | Admitting: Behavioral Health

## 2023-06-16 ENCOUNTER — Ambulatory Visit: Payer: BC Managed Care – PPO | Admitting: Behavioral Health

## 2023-06-16 DIAGNOSIS — F411 Generalized anxiety disorder: Secondary | ICD-10-CM

## 2023-06-16 NOTE — Progress Notes (Signed)
 [facility] Counselor/Therapist Progress Note  Patient ID: Darlene Ochoa, MRN: [id number],    Date: 06/16/2023  Time Spent: 8 AM until 8:55 AM, 55 minutes   spent in person with the patient in the outpatient therapist office.  Treatment Type: Individual Therapy  Reported Symptoms: Anxiety/stress  Mental Status Exam: Appearance:  Well Groomed     Behavior: Appropriate  Motor: Normal  Speech/Language:  Clear and Coherent  Affect: Appropriate  Mood: normal  Thought process: normal  Thought content:   WNL  Sensory/Perceptual disturbances:   WNL  Orientation: oriented to person, place, time/date, situation, day of week, month of year, and year  Attention: Good  Concentration: Good  Memory: WNL  Fund of knowledge:  Good  Insight:   Good  Judgment:  Good  Impulse Control: Good   Risk Assessment: Danger to Self:  No Self-injurious Behavior: No Danger to Others: No Duty to Warn:no Physical Aggression / Violence:No  Access to Firearms a concern: No  Gang Involvement:No   Subjective: We continued to work on the patient setting healthy boundaries with the ultimate goal of self-care.  We worked on this specifically with a family member who is in her home.  We talked about the reasoning behind the importance of setting clear memory and where some of those things which keep her from setting clear boundaries.  She does not want to be seen as being angry hard to get along with and knows that is not her nature.  We talked about looking at the intense facilitating changes opposed to the method of delivering the message without change.  We also worked on Conservator, museum/gallery and role played how she might be less comfortable and having very difficult conversations and setting very clear boundaries. The patient does contract for safety having no thoughts of hurting herself or anyone else.  Interventions: Cognitive Behavioral Therapy  Diagnosis: Generalized anxiety  disorder Treatment plan: Goals will be to use cognitive behavioral therapy as well as elements of dialectical behavior reduce the patient's anxiety by at least 50% with a target date of June 19, 2023.  Goals will be to improve her ability to better manage stress and anxiety symptoms, identify and process causes for her anxiety and stress as well as resolving or conflicts contributing to the anxiety.  Lastly we will help her manage thoughts and worrisome thinking contributing to feelings of anxiety and stress.  Interventions will include educating the patient about anxiety so that she can see its causes symptoms and triggers.  We will provide problem solution skills to help her identify options for resolving stress as well as coping skills such as biofeedback, guided imagery etc. to manage stress and anxiety.  We will use cognitive behavioral therapy to identify and change anxiety provoking thought behavior patterns as well as dialectical behavior therapy to teach distress tolerance and mindfulness skills. Progress: 35% Plan: I will meet with the patient every 2 to 3 weeks in person.  [name], [facility]                  [name], [facility]               [name], [facility]               [name], [facility]               [name], [facility]  [name], [facility]               [name], [facility]               [name], [facility]               [name], [facility]               [name], [facility]               [name], [facility]               [name], [facility]  [facility] Counselor/Therapist Progress Note  Patient ID: Darlene Ochoa, MRN: [id number],    Date: 06/16/2023  Time Spent: 8 AM until 8:45 AM, 45 minutes   spent in person with the patient in the outpatient therapist  office.  Treatment Type: Individual Therapy  Reported Symptoms: Anxiety/stress  Mental Status Exam: Appearance:  Well Groomed     Behavior: Appropriate  Motor: Normal  Speech/Language:  Clear and Coherent  Affect: Appropriate  Mood: normal  Thought process: normal  Thought content:   WNL  Sensory/Perceptual disturbances:   WNL  Orientation: oriented to person, place, time/date, situation, day of week, month of year, and year  Attention: Good  Concentration: Good  Memory: WNL  Fund of knowledge:  Good  Insight:   Good  Judgment:  Good  Impulse Control: Good   Risk Assessment: Danger to Self:  No Self-injurious Behavior: No Danger to Others: No Duty to Warn:no Physical Aggression / Violence:No  Access to Firearms a concern: No  Gang Involvement:No   Subjective: The patient set some very firm boundaries with her daughters over the weekend.  She had already been frustrated with how poorly they are keeping the room and bathroom on the spilled over into the living area and they would not do anything about it she set some very clear boundaries to help.  She said that she changed her tone in her body language with both of her adult daughters and talked to them but they did not do something to clean it up and keep it cleaned up very quickly they could find somewhere else to live.  One of them seem to take it very seriously and is starting back in significant changes.  She knows the young her but she feels will have to be more reinforcement with that and we talked about what that looks like and how she is comfortable expressing that and where to draw the line if it comes to that.  The patient felt very comfortable in that she knows she did a good thing for herself and for her daughters The patient does contract for safety having no thoughts of hurting herself or anyone else.  Interventions: Cognitive Behavioral Therapy  Diagnosis: Generalized anxiety disorder Treatment plan: Goals will  be to use cognitive behavioral therapy as well as elements of dialectical behavior reduce the patient's anxiety by at least 50% with a target date of June 19, 2023.  Goals will be to improve her ability to better manage stress and anxiety symptoms, identify and process causes for her anxiety and stress as well as resolving or conflicts contributing to the anxiety.  Lastly we will help her manage thoughts and worrisome thinking contributing to feelings of anxiety and stress.  Interventions will include educating the patient about anxiety so that she can see its causes symptoms and triggers.  We will provide problem solution skills to help her identify options for resolving stress as well as coping skills such as biofeedback, guided imagery etc. to manage stress and anxiety.  We will use cognitive behavioral therapy to identify and change anxiety provoking thought behavior patterns as well as dialectical behavior therapy to teach distress tolerance and mindfulness skills. Progress: 35% Plan: I will meet with the patient every 2 to 3 weeks in person.  [name], [facility]                  [name], [facility]               [name], [facility]               [name], [facility]               [name], [facility]               [name], [facility]               [name], [facility]               [name], [facility]               [name], [facility]               [name], [facility]               [name], [facility]               [name], [facility]               [name], [facility]               [name], [facility]

## 2023-06-22 ENCOUNTER — Ambulatory Visit: Payer: BC Managed Care – PPO | Admitting: Behavioral Health

## 2023-07-03 DIAGNOSIS — N951 Menopausal and female climacteric states: Secondary | ICD-10-CM | POA: Diagnosis not present

## 2023-07-03 DIAGNOSIS — N3010 Interstitial cystitis (chronic) without hematuria: Secondary | ICD-10-CM | POA: Diagnosis not present

## 2023-07-03 DIAGNOSIS — N393 Stress incontinence (female) (male): Secondary | ICD-10-CM | POA: Diagnosis not present

## 2023-07-03 DIAGNOSIS — R102 Pelvic and perineal pain: Secondary | ICD-10-CM | POA: Diagnosis not present

## 2023-07-06 ENCOUNTER — Ambulatory Visit: Payer: BC Managed Care – PPO | Admitting: Behavioral Health

## 2023-07-06 ENCOUNTER — Encounter: Payer: Self-pay | Admitting: Behavioral Health

## 2023-07-06 DIAGNOSIS — F411 Generalized anxiety disorder: Secondary | ICD-10-CM | POA: Diagnosis not present

## 2023-07-06 DIAGNOSIS — F419 Anxiety disorder, unspecified: Secondary | ICD-10-CM

## 2023-07-06 NOTE — Progress Notes (Signed)
 [facility] Counselor/Therapist Progress Note  Patient ID: Janiyla Long, MRN: [id number],    Date: 07/06/2023  Time Spent: 8 AM until 8:57 AM, 57 minutes   spent in person with the patient in the outpatient therapist office.  Treatment Type: Individual Therapy  Reported Symptoms: Anxiety/stress  Mental Status Exam: Appearance:  Well Groomed     Behavior: Appropriate  Motor: Normal  Speech/Language:  Clear and Coherent  Affect: Appropriate  Mood: normal  Thought process: normal  Thought content:   WNL  Sensory/Perceptual disturbances:   WNL  Orientation: oriented to person, place, time/date, situation, day of week, month of year, and year  Attention: Good  Concentration: Good  Memory: WNL  Fund of knowledge:  Good  Insight:   Good  Judgment:  Good  Impulse Control: Good   Risk Assessment: Danger to Self:  No Self-injurious Behavior: No Danger to Others: No Duty to Warn:no Physical Aggression / Violence:No  Access to Firearms a concern: No  Gang Involvement:No   Subjective: There have been some positive changes at home as her daughters are doing a better job of maintaining their space.  She knows that there are still improvements that can be made but she is continuing to set clear boundaries holding them accountable for caring for their part of the home as long as they live there.  Her daughter schedule has changed from 8-5 so that helps her in terms of caring for her granddaughter.  She and her husband are also planning some time away and is telling her daughter that she needs to find additional people to help care for the child.  They also took the house off the market to make some additional repairs but have come to a point where they know they have done everything that needed to be done and planned to relisted soon.  They have had to spend more money than he wanted to so there is some financial stress and she is hopeful that they can recoup what they have had to  put in.  For the most part she is handling things well but says she notices when she has had enough of being around people that her anxiety needs to be addressed a little more directly.  She knows that she recharge her batteries with quiet time so we talked about a little ways that she could do that including working in the yard/garden but we also looked at longer term solutions including weekends away in their camper.  She and her husband also plan at least a 2-week trip sometime this summer.  I continue to encourage use of coping skills as well as healthy boundaries to protect her  Interventions: Cognitive Behavioral Therapy  Diagnosis: Generalized anxiety disorder Treatment plan: Goals will be to use cognitive behavioral therapy as well as elements of dialectical behavior reduce the patient's anxiety by at least 50% with a target date of June 19, 2023.  Goals will be to improve her ability to better manage stress and anxiety symptoms, identify and process causes for her anxiety and stress as well as resolving or conflicts contributing to the anxiety.  Lastly we will help her manage thoughts and worrisome thinking contributing to feelings of anxiety and stress.  Interventions will include educating the patient about anxiety so that she can see its causes symptoms and triggers.  We will provide problem solution skills to help her identify options for resolving stress as well as coping skills such as biofeedback, guided imagery etc. to  manage stress and anxiety.  We will use cognitive behavioral therapy to identify and change anxiety provoking thought behavior patterns as well as dialectical behavior therapy to teach distress tolerance and mindfulness skills. Progress: 40% I reviewed the treatment plan with the patient and with the patient's agreement we will extend a new goal target date of December 20, 2023. Plan: I will meet with the patient every 2 to 3 weeks in person.  [name],  [facility]

## 2023-07-13 ENCOUNTER — Encounter: Payer: Self-pay | Admitting: Behavioral Health

## 2023-07-13 ENCOUNTER — Ambulatory Visit: Payer: BC Managed Care – PPO | Admitting: Behavioral Health

## 2023-07-13 DIAGNOSIS — F411 Generalized anxiety disorder: Secondary | ICD-10-CM | POA: Diagnosis not present

## 2023-07-13 NOTE — Progress Notes (Signed)
 [facility] Counselor/Therapist Progress Note  Patient ID: Darlene Ochoa, MRN: [id number],    Date: 07/13/2023  Time Spent: 8 AM until 8:54 AM, 54 minutes   spent in person with the patient in the outpatient therapist office.  Treatment Type: Individual Therapy  Reported Symptoms: Anxiety/stress  Mental Status Exam: Appearance:  Well Groomed     Behavior: Appropriate  Motor: Normal  Speech/Language:  Clear and Coherent  Affect: Appropriate  Mood: normal  Thought process: normal  Thought content:   WNL  Sensory/Perceptual disturbances:   WNL  Orientation: oriented to person, place, time/date, situation, day of week, month of year, and year  Attention: Good  Concentration: Good  Memory: WNL  Fund of knowledge:  Good  Insight:   Good  Judgment:  Good  Impulse Control: Good   Risk Assessment: Danger to Self:  No Self-injurious Behavior: No Danger to Others: No Duty to Warn:no Physical Aggression / Violence:No  Access to Firearms a concern: No  Gang Involvement:No   Subjective: There have been some positive changes at home as her daughters are doing a better job of maintaining their space.  She and her husband have now done everything a neat due to the house they are trying to sell and have to get back on the market this week.  She has started doing some things outside which she knows is good therapy for her.  She is setting very clear boundaries with her daughters and sticking to them.  She is trying to do some things on Saturdays with her husband when he is home as well as do things for herself at night.  There are still some medical issues which she thought were being addressed but a couple of the medication she was allergic to so they are trying to find 1 that works.  That pain level has gone down but there is still fairly consistent discomfort that she is resting listening to her body.  She reports that she is learning more to live by the what I cannot control  mentality and that helps manage her anxiety. Interventions: Cognitive Behavioral Therapy  Diagnosis: Generalized anxiety disorder Treatment plan: Goals will be to use cognitive behavioral therapy as well as elements of dialectical behavior reduce the patient's anxiety by at least 50% with a target date of June 19, 2023.  Goals will be to improve her ability to better manage stress and anxiety symptoms, identify and process causes for her anxiety and stress as well as resolving or conflicts contributing to the anxiety.  Lastly we will help her manage thoughts and worrisome thinking contributing to feelings of anxiety and stress.  Interventions will include educating the patient about anxiety so that she can see its causes symptoms and triggers.  We will provide problem solution skills to help her identify options for resolving stress as well as coping skills such as biofeedback, guided imagery etc. to manage stress and anxiety.  We will use cognitive behavioral therapy to identify and change anxiety provoking thought behavior patterns as well as dialectical behavior therapy to teach distress tolerance and mindfulness skills. Progress: 40% I reviewed the treatment plan with the patient and with the patient's agreement we will extend a new goal target date of December 20, 2023. Plan: I will meet with the patient every 2 to 3 weeks in person.  [name], [facility]  [name], [facility]

## 2023-07-20 ENCOUNTER — Encounter: Payer: Self-pay | Admitting: Behavioral Health

## 2023-07-20 ENCOUNTER — Ambulatory Visit: Payer: BC Managed Care – PPO | Admitting: Behavioral Health

## 2023-07-20 DIAGNOSIS — F411 Generalized anxiety disorder: Secondary | ICD-10-CM | POA: Diagnosis not present

## 2023-07-20 NOTE — Progress Notes (Signed)
 [facility] Counselor/Therapist Progress Note  Patient ID: Darlene Ochoa, MRN: [id number],    Date: 07/20/2023  Time Spent: 8 AM until 8:53 AM, 53 minutes   spent in person with the patient in the outpatient therapist office.  Treatment Type: Individual Therapy  Reported Symptoms: Anxiety/stress  Mental Status Exam: Appearance:  Well Groomed     Behavior: Appropriate  Motor: Normal  Speech/Language:  Clear and Coherent  Affect: Appropriate  Mood: normal  Thought process: normal  Thought content:   WNL  Sensory/Perceptual disturbances:   WNL  Orientation: oriented to person, place, time/date, situation, day of week, month of year, and year  Attention: Good  Concentration: Good  Memory: WNL  Fund of knowledge:  Good  Insight:   Good  Judgment:  Good  Impulse Control: Good   Risk Assessment: Danger to Self:  No Self-injurious Behavior: No Danger to Others: No Duty to Warn:no Physical Aggression / Violence:No  Access to Firearms a concern: No  Gang Involvement:No   Subjective: Has been a pretty good week for the patient.  She has not been having as much physical discomfort but she did say that she spent a lot of time working in the yard over the weekend and that did start that up a little bit.  She knows things now to tamp that down somewhat and is escalated but said working out of the yard was great for her mentally emotionally and physically.  She continues to set good boundaries with her daughters but we talked about the need for continued reminders of left boundaries and the reason why those boundaries are so important to the patient.  They are getting ready to put their old home up for sale again and does that we will take a load off of her also.  She is admitting that she is being more proactive in doing things for herself throughout the week when she does not have her granddaughters but also on weekends with her husband. She does contract for safety having no  thoughts of hurting herself or anyone else. Interventions: Cognitive Behavioral Therapy  Diagnosis: Generalized anxiety disorder Treatment plan: Goals will be to use cognitive behavioral therapy as well as elements of dialectical behavior reduce the patient's anxiety by at least 50% with a target date of June 19, 2023.  Goals will be to improve her ability to better manage stress and anxiety symptoms, identify and process causes for her anxiety and stress as well as resolving or conflicts contributing to the anxiety.  Lastly we will help her manage thoughts and worrisome thinking contributing to feelings of anxiety and stress.  Interventions will include educating the patient about anxiety so that she can see its causes symptoms and triggers.  We will provide problem solution skills to help her identify options for resolving stress as well as coping skills such as biofeedback, guided imagery etc. to manage stress and anxiety.  We will use cognitive behavioral therapy to identify and change anxiety provoking thought behavior patterns as well as dialectical behavior therapy to teach distress tolerance and mindfulness skills. Progress: 40% I reviewed the treatment plan with the patient and with the patient's agreement we will extend a new goal target date of December 20, 2023. Plan: I will meet with the patient every 2 to 3 weeks in person.    [name], [facility]  [name], [facility]  [facility] Counselor/Therapist Progress Note  Patient ID: Darlene Ochoa, MRN: [id number],    Date: 07/20/2023  Time Spent: 8 AM until 8:54 AM, 54 minutes   spent in person with the patient in the outpatient therapist office.  Treatment Type: Individual Therapy  Reported Symptoms: Anxiety/stress  Mental Status Exam: Appearance:  Well Groomed     Behavior: Appropriate  Motor: Normal  Speech/Language:  Clear and Coherent  Affect:  Appropriate  Mood: normal  Thought process: normal  Thought content:   WNL  Sensory/Perceptual disturbances:   WNL  Orientation: oriented to person, place, time/date, situation, day of week, month of year, and year  Attention: Good  Concentration: Good  Memory: WNL  Fund of knowledge:  Good  Insight:   Good  Judgment:  Good  Impulse Control: Good   Risk Assessment: Danger to Self:  No Self-injurious Behavior: No Danger to Others: No Duty to Warn:no Physical Aggression / Violence:No  Access to Firearms a concern: No  Gang Involvement:No   Subjective: There have been some positive changes at home as her daughters are doing a better job of maintaining their space.  She and her husband have now done everything a neat due to the house they are trying to sell and have to get back on the market this week.  She has started doing some things outside which she knows is good therapy for her.  She is setting very clear boundaries with her daughters and sticking to them.  She is trying to do some things on Saturdays with her husband when he is home as well as do things for herself at night.  There are still some medical issues which she thought were being addressed but a couple of the medication she was allergic to so they are trying to find 1 that works.  That pain level has gone down but there is still fairly consistent discomfort that she is resting listening to her body.  She reports that she is learning more to live by the what I cannot control mentality and that helps manage her anxiety. Interventions: Cognitive Behavioral Therapy  Diagnosis: Generalized anxiety disorder Treatment plan: Goals will be to use cognitive behavioral therapy as well as elements of dialectical behavior reduce the patient's anxiety by at least 50% with a target date of June 19, 2023.  Goals will be to improve her ability to better manage stress and anxiety symptoms, identify and process causes for her anxiety and  stress as well as resolving or conflicts contributing to the anxiety.  Lastly we will help her manage thoughts and worrisome thinking contributing to feelings of anxiety and stress.  Interventions will include educating the patient about anxiety so that she can see its causes symptoms and triggers.  We will provide problem solution skills to help her identify options for resolving stress as well as coping skills such as biofeedback, guided imagery etc. to manage stress and anxiety.  We will use cognitive behavioral therapy to identify and change anxiety provoking thought behavior patterns as well as dialectical behavior therapy to teach distress tolerance and mindfulness skills. Progress: 40% I reviewed the treatment plan with the patient and with the patient's agreement we will extend a new goal target date of December 20, 2023. Plan: I will meet with the patient every 2 to 3 weeks in person.  [name], [facility]  [name], [facility]               [name], [facility]

## 2023-08-03 ENCOUNTER — Encounter: Payer: Self-pay | Admitting: Behavioral Health

## 2023-08-03 ENCOUNTER — Ambulatory Visit: Admitting: Behavioral Health

## 2023-08-03 DIAGNOSIS — F411 Generalized anxiety disorder: Secondary | ICD-10-CM | POA: Diagnosis not present

## 2023-08-03 NOTE — Progress Notes (Signed)
 [facility] Counselor/Therapist Progress Note  Patient ID: Darlene Ochoa, MRN: [id number],    Date: 08/03/2023  Time Spent: 8 AM until 8:55 AM, 55 minutes   spent in person with the patient in the outpatient therapist office.  Treatment Type: Individual Therapy  Reported Symptoms: Anxiety/stress  Mental Status Exam: Appearance:  Well Groomed     Behavior: Appropriate  Motor: Normal  Speech/Language:  Clear and Coherent  Affect: Appropriate  Mood: normal  Thought process: normal  Thought content:   WNL  Sensory/Perceptual disturbances:   WNL  Orientation: oriented to person, place, time/date, situation, day of week, month of year, and year  Attention: Good  Concentration: Good  Memory: WNL  Fund of knowledge:  Good  Insight:   Good  Judgment:  Good  Impulse Control: Good   Risk Assessment: Danger to Self:  No Self-injurious Behavior: No Danger to Others: No Duty to Warn:no Physical Aggression / Violence:No  Access to Firearms a concern: No  Gang Involvement:No   Subjective: The patient reports her mood has been stable and her anxiety has been mild to manageable.  Most of it is because of the choices that she has made.  One of her daughters is doing a much better job around the house and she has to stay fairly consistent with the other but does take them to the approach that she is not compliant that bother her anymore and she is not going to follow up with cleaning up after the daughter and granddaughter.  She and her husband are consistently finding something to do on the weekends for themselves.  She is working in the garden and in the yard which create therapy for her.  She is taking time for herself during the day when she does not have her granddaughters.  She feels that she is balancing working with both of her daughters being both fair and loving.  She feels the setting boundaries and practicing self-care has helped reduce her anxiety significantly. She  does contract for safety having no thoughts of hurting herself or anyone else. Interventions: Cognitive Behavioral Therapy  Diagnosis: Generalized anxiety disorder Treatment plan: Goals will be to use cognitive behavioral therapy as well as elements of dialectical behavior reduce the patient's anxiety by at least 50% with a target date of June 19, 2023.  Goals will be to improve her ability to better manage stress and anxiety symptoms, identify and process causes for her anxiety and stress as well as resolving or conflicts contributing to the anxiety.  Lastly we will help her manage thoughts and worrisome thinking contributing to feelings of anxiety and stress.  Interventions will include educating the patient about anxiety so that she can see its causes symptoms and triggers.  We will provide problem solution skills to help her identify options for resolving stress as well as coping skills such as biofeedback, guided imagery etc. to manage stress and anxiety.  We will use cognitive behavioral therapy to identify and change anxiety provoking thought behavior patterns as well as dialectical behavior therapy to teach distress tolerance and mindfulness skills. Progress: 40% I reviewed the treatment plan with the patient and with the patient's agreement we will extend a new goal target date of December 20, 2023. Plan: I will meet with the patient every 2 to 3 weeks in person.    [name], [facility]  [name], [facility]  [facility] Counselor/Therapist Progress Note  Patient ID: Darlene Ochoa, MRN: [id number],    Date: 08/03/2023  Time Spent: 8 AM until 8:54 AM, 54 minutes   spent in person with the patient in the outpatient therapist office.  Treatment Type: Individual Therapy  Reported Symptoms: Anxiety/stress  Mental Status Exam: Appearance:  Well Groomed     Behavior: Appropriate  Motor: Normal  Speech/Language:   Clear and Coherent  Affect: Appropriate  Mood: normal  Thought process: normal  Thought content:   WNL  Sensory/Perceptual disturbances:   WNL  Orientation: oriented to person, place, time/date, situation, day of week, month of year, and year  Attention: Good  Concentration: Good  Memory: WNL  Fund of knowledge:  Good  Insight:   Good  Judgment:  Good  Impulse Control: Good   Risk Assessment: Danger to Self:  No Self-injurious Behavior: No Danger to Others: No Duty to Warn:no Physical Aggression / Violence:No  Access to Firearms a concern: No  Gang Involvement:No   Subjective: There have been some positive changes at home as her daughters are doing a better job of maintaining their space.  She and her husband have now done everything a neat due to the house they are trying to sell and have to get back on the market this week.  She has started doing some things outside which she knows is good therapy for her.  She is setting very clear boundaries with her daughters and sticking to them.  She is trying to do some things on Saturdays with her husband when he is home as well as do things for herself at night.  There are still some medical issues which she thought were being addressed but a couple of the medication she was allergic to so they are trying to find 1 that works.  That pain level has gone down but there is still fairly consistent discomfort that she is resting listening to her body.  She reports that she is learning more to live by the what I cannot control mentality and that helps manage her anxiety. Interventions: Cognitive Behavioral Therapy  Diagnosis: Generalized anxiety disorder Treatment plan: Goals will be to use cognitive behavioral therapy as well as elements of dialectical behavior reduce the patient's anxiety by at least 50% with a target date of June 19, 2023.  Goals will be to improve her ability to better manage stress and anxiety symptoms, identify and process  causes for her anxiety and stress as well as resolving or conflicts contributing to the anxiety.  Lastly we will help her manage thoughts and worrisome thinking contributing to feelings of anxiety and stress.  Interventions will include educating the patient about anxiety so that she can see its causes symptoms and triggers.  We will provide problem solution skills to help her identify options for resolving stress as well as coping skills such as biofeedback, guided imagery etc. to manage stress and anxiety.  We will use cognitive behavioral therapy to identify and change anxiety provoking thought behavior patterns as well as dialectical behavior therapy to teach distress tolerance and mindfulness skills. Progress: 40% I reviewed the treatment plan with the patient and with the patient's agreement we will extend a new goal target date of December 20, 2023. Plan: I will meet with the patient every 2 to 3 weeks in person.  [name], [facility]  Cecile Coder, [facility]               Cecile Coder, [facility]               Cecile Coder, [facility]

## 2023-08-08 ENCOUNTER — Other Ambulatory Visit: Payer: Self-pay | Admitting: Physician Assistant

## 2023-08-08 DIAGNOSIS — I10 Essential (primary) hypertension: Secondary | ICD-10-CM

## 2023-08-17 ENCOUNTER — Ambulatory Visit: Admitting: Behavioral Health

## 2023-08-31 ENCOUNTER — Ambulatory Visit: Admitting: Behavioral Health

## 2023-09-21 ENCOUNTER — Encounter: Payer: Self-pay | Admitting: Behavioral Health

## 2023-09-21 ENCOUNTER — Ambulatory Visit: Admitting: Behavioral Health

## 2023-09-21 DIAGNOSIS — F419 Anxiety disorder, unspecified: Secondary | ICD-10-CM | POA: Diagnosis not present

## 2023-09-21 NOTE — Progress Notes (Signed)
 [facility] Counselor/Therapist Progress Note  Patient ID: Darlene Ochoa, MRN: [id number],    Date: 09/21/2023  Time Spent: 8 AM until 8:55 AM, 55 minutes   spent in person with the patient in the outpatient therapist office.  Treatment Type: Individual Therapy  Reported Symptoms: Anxiety/stress  Mental Status Exam: Appearance:  Well Groomed     Behavior: Appropriate  Motor: Normal  Speech/Language:  Clear and Coherent  Affect: Appropriate  Mood: normal  Thought process: normal  Thought content:   WNL  Sensory/Perceptual disturbances:   WNL  Orientation: oriented to person, place, time/date, situation, day of week, month of year, and year  Attention: Good  Concentration: Good  Memory: WNL  Fund of knowledge:  Good  Insight:   Good  Judgment:  Good  Impulse Control: Good   Risk Assessment: Danger to Self:  No Self-injurious Behavior: No Danger to Others: No Duty to Warn:no Physical Aggression / Violence:No  Access to Firearms a concern: No  Gang Involvement:No   Subjective: The patient reports her mood has been stable and her anxiety has been mild to manageable.  Most of it is because of the choices that she has made.  One of her daughters is doing a much better job around the house and she has to stay fairly consistent with the other but does take them to the approach that she is not compliant that bother her anymore and she is not going to follow up with cleaning up after the daughter and granddaughter.  She and her husband are consistently finding something to do on the weekends for themselves.  She is working in the garden and in the yard which create therapy for her.  She is taking time for herself during the day when she does not have her granddaughters.  She feels that she is balancing working with both of her daughters being both fair and loving.  She feels the setting boundaries and practicing self-care has helped reduce her anxiety significantly. She does  contract for safety having no thoughts of hurting herself or anyone else. Interventions: Cognitive Behavioral Therapy  Diagnosis: Generalized anxiety disorder Treatment plan: Goals will be to use cognitive behavioral therapy as well as elements of dialectical behavior reduce the patient's anxiety by at least 50% with a target date of June 19, 2023.  Goals will be to improve her ability to better manage stress and anxiety symptoms, identify and process causes for her anxiety and stress as well as resolving or conflicts contributing to the anxiety.  Lastly we will help her manage thoughts and worrisome thinking contributing to feelings of anxiety and stress.  Interventions will include educating the patient about anxiety so that she can see its causes symptoms and triggers.  We will provide problem solution skills to help her identify options for resolving stress as well as coping skills such as biofeedback, guided imagery etc. to manage stress and anxiety.  We will use cognitive behavioral therapy to identify and change anxiety provoking thought behavior patterns as well as dialectical behavior therapy to teach distress tolerance and mindfulness skills. Progress: 40% I reviewed the treatment plan with the patient and with the patient's agreement we will extend a new goal target date of December 20, 2023. Plan: I will meet with the patient every 2 to 3 weeks in person.    Cecile Coder, [facility]  Cecile Coder, [facility]  [facility] Counselor/Therapist Progress Note  Patient ID: Darlene Ochoa, MRN: [id number],    Date: 09/21/2023  Time Spent: 10:02 AM until 11 AM, 58 minutes   spent in person with the patient in the outpatient therapist office.  Treatment Type: Individual Therapy  Reported Symptoms: Anxiety/stress  Mental Status Exam: Appearance:  Well Groomed     Behavior: Appropriate  Motor: Normal  Speech/Language:   Clear and Coherent  Affect: Appropriate  Mood: normal  Thought process: normal  Thought content:   WNL  Sensory/Perceptual disturbances:   WNL  Orientation: oriented to person, place, time/date, situation, day of week, month of year, and year  Attention: Good  Concentration: Good  Memory: WNL  Fund of knowledge:  Good  Insight:   Good  Judgment:  Good  Impulse Control: Good   Risk Assessment: Danger to Self:  No Self-injurious Behavior: No Danger to Others: No Duty to Warn:no Physical Aggression / Violence:No  Access to Firearms a concern: No  Gang Involvement:No   Subjective: The patient's old house is now under contract which she says takes a huge burden off of them.  He will close by the end of this month.  One of her daughters for the most part is not at their home anymore.  She has had some mental emotional and verbal boundaries with the other daughter in terms of acceptance of how much she is going to do to help her daughter help.  She will continue to help her granddaughter.  The big part of patient's stress have been keeping one granddaughter 3 days a week 5 days a week.  The granddaughter that she has 5 days a week is fairly concrete right now so we talked about how she could alleviate that some.  She has a daughter-in-law who will keep that granddaughter for at least a morning once a week so I encouraged her to strongly consider that.  We talked about not letting that keep her in the house finding ways to go out and do things that she can put the granddaughter in stroller etc.  She does valued the time that she gets for herself so we talked about more ways to make that happen.  Her anxiety is manageable now she does not want to get to the point where it was and so we will continue to look at ways to alleviate that including setting boundaries  Interventions: Cognitive Behavioral Therapy  Diagnosis: Generalized anxiety disorder Treatment plan: Goals will be to use cognitive  behavioral therapy as well as elements of dialectical behavior reduce the patient's anxiety by at least 50% with a target date of June 19, 2023.  Goals will be to improve her ability to better manage stress and anxiety symptoms, identify and process causes for her anxiety and stress as well as resolving or conflicts contributing to the anxiety.  Lastly we will help her manage thoughts and worrisome thinking contributing to feelings of anxiety and stress.  Interventions will include educating the patient about anxiety so that she can see its causes symptoms and triggers.  We will provide problem solution skills to help her identify options for resolving stress as well as coping skills such as biofeedback, guided imagery etc. to manage stress and anxiety.  We will use cognitive behavioral therapy to identify and change anxiety provoking thought behavior patterns as well as dialectical behavior therapy to teach distress tolerance and mindfulness skills. Progress: 40% I reviewed the treatment plan with  the patient and with the patient's agreement we will extend a new goal target date of December 20, 2023. Plan: I will meet with the patient every 2 to 3 weeks in person.  Cecile Coder, [facility]                                 Cecile Coder, [facility]               Cecile Coder, [facility]               Cecile Coder, [facility]               Cecile Coder, [facility]

## 2023-10-05 ENCOUNTER — Ambulatory Visit: Admitting: Behavioral Health

## 2023-10-05 ENCOUNTER — Encounter: Payer: Self-pay | Admitting: Behavioral Health

## 2023-10-05 DIAGNOSIS — F411 Generalized anxiety disorder: Secondary | ICD-10-CM

## 2023-10-05 NOTE — Progress Notes (Signed)
 [facility] Counselor/Therapist Progress Note  Patient ID: Darlene Ochoa, MRN: [id number],    Date: 10/05/2023  Time Spent: 8 AM until 8:58 AM, 58 minutes   spent in person with the patient in the outpatient therapist office.  Treatment Type: Individual Therapy  Reported Symptoms: Anxiety/stress  Mental Status Exam: Appearance:  Well Groomed     Behavior: Appropriate  Motor: Normal  Speech/Language:  Clear and Coherent  Affect: Appropriate  Mood: normal  Thought process: normal  Thought content:   WNL  Sensory/Perceptual disturbances:   WNL  Orientation: oriented to person, place, time/date, situation, day of week, month of year, and year  Attention: Good  Concentration: Good  Memory: WNL  Fund of knowledge:  Good  Insight:   Good  Judgment:  Good  Impulse Control: Good   Risk Assessment: Danger to Self:  No Self-injurious Behavior: No Danger to Others: No Duty to Warn:no Physical Aggression / Violence:No  Access to Firearms a concern: No  Gang Involvement:No   Subjective: The patient and her husband will close on the house they were selling next week and she does not anticipate any thing holding that out.  She is excited to have that off of her plate.  Also helps them out financially.  She acknowledges that she stays tired all the time because of keeping her granddaughters but recognizes that Change right now.  She says she is changing the way she responds to situations as opposed to trying to change his situation and sees that as significant progress.  She did speak to her son about some concerns that she observed with one of her granddaughters again felt that he had noticed some of the same things.  She said that this knows that is something she can control.  She does not have as much time as she would like to do gardening but she is trying to be very intentional about doing things for herself when she does not have her granddaughters and especially she and her  husband doing things when he is at home especially on the weekends.  She says her anxiety has been manageable and she is learning to let go of things which are out of her hands. She feels the setting boundaries and practicing self-care has helped reduce her anxiety significantly. She does contract for safety having no thoughts of hurting herself or anyone else. Interventions: Cognitive Behavioral Therapy  Diagnosis: Generalized anxiety disorder Treatment plan: Goals will be to use cognitive behavioral therapy as well as elements of dialectical behavior reduce the patient's anxiety by at least 50% with a target date of June 19, 2023.  Goals will be to improve her ability to better manage stress and anxiety symptoms, identify and process causes for her anxiety and stress as well as resolving or conflicts contributing to the anxiety.  Lastly we will help her manage thoughts and worrisome thinking contributing to feelings of anxiety and stress.  Interventions will include educating the patient about anxiety so that she can see its causes symptoms and triggers.  We will provide problem solution skills to help her identify options for resolving stress as well as coping skills such as biofeedback, guided imagery etc. to manage stress and anxiety.  We will use cognitive behavioral therapy to identify and change anxiety provoking thought behavior patterns as well as dialectical behavior therapy to teach distress tolerance and mindfulness skills. Progress: 40% I reviewed the treatment plan with the patient and with the patient's agreement we will extend  a new goal target date of December 20, 2023. Plan: I will meet with the patient every 2 to 3 weeks in person.    Cecile Coder, [facility]                                 Cecile Coder, [facility]  [facility] Counselor/Therapist Progress Note  Patient ID: Darlene Ochoa, MRN: [id number],    Date: 10/05/2023  Time Spent:   minutes   spent in person with the patient in the outpatient therapist office.  Treatment Type: Individual Therapy  Reported Symptoms: Anxiety/stress  Mental Status Exam: Appearance:  Well Groomed     Behavior: Appropriate  Motor: Normal  Speech/Language:  Clear and Coherent  Affect: Appropriate  Mood: normal  Thought process: normal  Thought content:   WNL  Sensory/Perceptual disturbances:   WNL  Orientation: oriented to person, place, time/date, situation, day of week, month of year, and year  Attention: Good  Concentration: Good  Memory: WNL  Fund of knowledge:  Good  Insight:   Good  Judgment:  Good  Impulse Control: Good   Risk Assessment: Danger to Self:  No Self-injurious Behavior: No Danger to Others: No Duty to Warn:no Physical Aggression / Violence:No  Access to Firearms a concern: No  Gang Involvement:No   Subjective: The patient's old house is now under contract which she says takes a huge burden off of them.  He will close by the end of this month.  One of her daughters for the most part is not at their home anymore.  She has had some mental emotional and verbal boundaries with the other daughter in terms of acceptance of how much she is going to do to help her daughter help.  She will continue to help her granddaughter.  The big part of patient's stress have been keeping one granddaughter 3 days a week 5 days a week.  The granddaughter that she has 5 days a week is fairly concrete right now so we talked about how she could alleviate that some.  She has a daughter-in-law who will keep that granddaughter for at least a morning once a week so I encouraged her to strongly consider that.  We talked about not letting that keep her in the house finding ways to go out and do things that she can put the granddaughter in stroller etc.  She does valued the time that she gets for herself so we talked about more ways to make that happen.  Her anxiety is manageable now she does  not want to get to the point where it was and so we will continue to look at ways to alleviate that including setting boundaries  Interventions: Cognitive Behavioral Therapy  Diagnosis: Generalized anxiety disorder Treatment plan: Goals will be to use cognitive behavioral therapy as well as elements of dialectical behavior reduce the patient's anxiety by at least 50% with a target date of June 19, 2023.  Goals will be to improve her ability to better manage stress and anxiety symptoms, identify and process causes for her anxiety and stress as well as resolving or conflicts contributing to the anxiety.  Lastly we will help her manage thoughts and worrisome thinking contributing to feelings of anxiety and stress.  Interventions will include educating the patient about anxiety so that she can see its causes symptoms and triggers.  We will provide problem solution skills to help her identify  options for resolving stress as well as coping skills such as biofeedback, guided imagery etc. to manage stress and anxiety.  We will use cognitive behavioral therapy to identify and change anxiety provoking thought behavior patterns as well as dialectical behavior therapy to teach distress tolerance and mindfulness skills. Progress: 40% I reviewed the treatment plan with the patient and with the patient's agreement we will extend a new goal target date of December 20, 2023. Plan: I will meet with the patient every 2 to 3 weeks in person.  Cecile Coder, [facility]                                 Cecile Coder, [facility]               Cecile Coder, [facility]               Cecile Coder, [facility]               Cecile Coder, [facility]               Cecile Coder, [facility]

## 2023-10-19 ENCOUNTER — Ambulatory Visit: Admitting: Behavioral Health

## 2023-11-02 ENCOUNTER — Ambulatory Visit: Admitting: Obstetrics and Gynecology

## 2023-11-05 ENCOUNTER — Other Ambulatory Visit: Payer: Self-pay | Admitting: Physician Assistant

## 2023-11-05 DIAGNOSIS — I10 Essential (primary) hypertension: Secondary | ICD-10-CM

## 2023-11-23 ENCOUNTER — Ambulatory Visit: Admitting: Behavioral Health

## 2023-12-15 ENCOUNTER — Ambulatory Visit (INDEPENDENT_AMBULATORY_CARE_PROVIDER_SITE_OTHER): Admitting: Behavioral Health

## 2023-12-15 ENCOUNTER — Encounter: Payer: Self-pay | Admitting: Behavioral Health

## 2023-12-15 ENCOUNTER — Telehealth: Payer: Self-pay | Admitting: Behavioral Health

## 2023-12-15 DIAGNOSIS — F411 Generalized anxiety disorder: Secondary | ICD-10-CM | POA: Diagnosis not present

## 2023-12-15 NOTE — Telephone Encounter (Signed)
 Copied from CRM #[id number]. Topic: General - Running Late >> Dec 15, 2023  9:09 AM Diannia H wrote: Patient/patient representative is calling because they are running late for an appointment with Lorrene Hasten

## 2023-12-15 NOTE — Progress Notes (Signed)
 [facility] Counselor/Therapist Progress Note  Patient ID: Darlene Ochoa, MRN: [id number],    Date: 12/15/2023  Time Spent: 9:16 AM until 9:56 AM, 40 minutes   spent in person with the patient in the outpatient therapist office.  Treatment Type: Individual Therapy  Reported Symptoms: Anxiety/stress  Mental Status Exam: Appearance:  Well Groomed     Behavior: Appropriate  Motor: Normal  Speech/Language:  Clear and Coherent  Affect: Appropriate  Mood: normal  Thought process: normal  Thought content:   WNL  Sensory/Perceptual disturbances:   WNL  Orientation: oriented to person, place, time/date, situation, day of week, month of year, and year  Attention: Good  Concentration: Good  Memory: WNL  Fund of knowledge:  Good  Insight:   Good  Judgment:  Good  Impulse Control: Good   Risk Assessment: Danger to Self:  No Self-injurious Behavior: No Danger to Others: No Duty to Warn:no Physical Aggression / Violence:No  Access to Firearms a concern: No  Gang Involvement:No   Subjective: I had not seen the patient in a few months because it had been a very busy summer.  There have been some positives.  Her oldest daughter has moved out of the home and is happy where she is.  The youngest daughter still at home but that is going well.  One of her granddaughters that she has babysat is in preschool 4 mornings per week from 9 AM until 12 PM and the other granddaughter is in preschool 2 mornings a week.  The patient is just now beginning to appreciate having several mornings a week to herself.  She and her husband are doing more things on weekends as they have time.  The old house sold in close and they no longer have the stress of trying to sell it over the financial burden of 2 payments.  They are starting to do something to their new home and she is excited about that.  She feels that her anxiety has been better as the summer has gone on is becoming more manageable.  She does  contract for safety having no thoughts of hurting herself or anyone else. Interventions: Cognitive Behavioral Therapy  Diagnosis: Generalized anxiety disorder Treatment plan: Goals will be to use cognitive behavioral therapy as well as elements of dialectical behavior reduce the patient's anxiety by at least 50% with a target date of June 19, 2023.  Goals will be to improve her ability to better manage stress and anxiety symptoms, identify and process causes for her anxiety and stress as well as resolving or conflicts contributing to the anxiety.  Lastly we will help her manage thoughts and worrisome thinking contributing to feelings of anxiety and stress.  Interventions will include educating the patient about anxiety so that she can see its causes symptoms and triggers.  We will provide problem solution skills to help her identify options for resolving stress as well as coping skills such as biofeedback, guided imagery etc. to manage stress and anxiety.  We will use cognitive behavioral therapy to identify and change anxiety provoking thought behavior patterns as well as dialectical behavior therapy to teach distress tolerance and mindfulness skills. Progress: 40% I reviewed the treatment plan with the patient and with the patient's agreement we will extend a new goal target date of December 20, 2023. Plan: I will meet with the patient every 2 to 3 weeks in person.    Darlene Ochoa, [facility]

## 2023-12-17 ENCOUNTER — Other Ambulatory Visit: Payer: Self-pay | Admitting: Physician Assistant

## 2023-12-17 DIAGNOSIS — M5416 Radiculopathy, lumbar region: Secondary | ICD-10-CM

## 2023-12-17 DIAGNOSIS — M180 Bilateral primary osteoarthritis of first carpometacarpal joints: Secondary | ICD-10-CM

## 2023-12-17 DIAGNOSIS — M7661 Achilles tendinitis, right leg: Secondary | ICD-10-CM

## 2023-12-17 DIAGNOSIS — R200 Anesthesia of skin: Secondary | ICD-10-CM

## 2023-12-22 ENCOUNTER — Encounter: Payer: Self-pay | Admitting: Sports Medicine

## 2024-01-04 ENCOUNTER — Ambulatory Visit: Admitting: Behavioral Health

## 2024-01-07 DIAGNOSIS — F3342 Major depressive disorder, recurrent, in full remission: Secondary | ICD-10-CM | POA: Diagnosis not present

## 2024-01-07 DIAGNOSIS — F411 Generalized anxiety disorder: Secondary | ICD-10-CM | POA: Diagnosis not present

## 2024-01-07 DIAGNOSIS — F5105 Insomnia due to other mental disorder: Secondary | ICD-10-CM | POA: Diagnosis not present

## 2024-01-20 ENCOUNTER — Ambulatory Visit: Admitting: Physician Assistant

## 2024-01-20 ENCOUNTER — Ambulatory Visit: Payer: Self-pay | Admitting: Physician Assistant

## 2024-01-20 ENCOUNTER — Ambulatory Visit

## 2024-01-20 ENCOUNTER — Encounter: Payer: Self-pay | Admitting: Physician Assistant

## 2024-01-20 VITALS — BP 130/80 | HR 80 | Ht 65.0 in | Wt 140.0 lb

## 2024-01-20 DIAGNOSIS — I10 Essential (primary) hypertension: Secondary | ICD-10-CM | POA: Diagnosis not present

## 2024-01-20 DIAGNOSIS — Z79899 Other long term (current) drug therapy: Secondary | ICD-10-CM | POA: Diagnosis not present

## 2024-01-20 DIAGNOSIS — R0789 Other chest pain: Secondary | ICD-10-CM

## 2024-01-20 DIAGNOSIS — E559 Vitamin D deficiency, unspecified: Secondary | ICD-10-CM | POA: Diagnosis not present

## 2024-01-20 DIAGNOSIS — Z131 Encounter for screening for diabetes mellitus: Secondary | ICD-10-CM

## 2024-01-20 DIAGNOSIS — E7800 Pure hypercholesterolemia, unspecified: Secondary | ICD-10-CM

## 2024-01-20 DIAGNOSIS — R079 Chest pain, unspecified: Secondary | ICD-10-CM | POA: Insufficient documentation

## 2024-01-20 LAB — TROPONIN T: Troponin T (Highly Sensitive): 7 ng/L (ref 0–14)

## 2024-01-20 MED ORDER — PREDNISONE 50 MG PO TABS: ORAL_TABLET | ORAL | 0 refills | Status: DC

## 2024-01-20 NOTE — Patient Instructions (Addendum)
 Start exercises.  Get CXR today.  Ice chest wall twice a day.  Stop mobic  for 5 days and take prednisone  then can resume mobic   Chest Wall Pain Chest wall pain is pain in or around the bones and muscles of your chest. Chest wall pain may be caused by: An injury. Coughing a lot. Using your chest and arm muscles too much. Sometimes, the cause may not be known. This pain may take a few weeks or longer to get better. Follow these instructions at home: Managing pain, stiffness, and swelling If told, put ice on the painful area: Put ice in a plastic bag. Place a towel between your skin and the bag. Leave the ice on for 20 minutes, 2-3 times a day.  Activity Rest as told by your doctor. Avoid doing things that cause pain. This includes lifting heavy items. Ask your doctor what activities are safe for you. General instructions  Take over-the-counter and prescription medicines only as told by your doctor. Do not use any products that contain nicotine or tobacco, such as cigarettes, e-cigarettes, and chewing tobacco. If you need help quitting, ask your doctor. Keep all follow-up visits as told by your doctor. This is important. Contact a doctor if: You have a fever. Your chest pain gets worse. You have new symptoms. Get help right away if: You feel sick to your stomach (nauseous) or you throw up (vomit). You feel sweaty or light-headed. You have a cough with mucus from your lungs (sputum) or you cough up blood. You are short of breath. These symptoms may be an emergency. Do not wait to see if the symptoms will go away. Get medical help right away. Call your local emergency services (911 in the [place] Do not drive yourself to the hospital. Summary Chest wall pain is pain in or around the bones and muscles of your chest. It may be treated with ice, rest, and medicines. Your condition may also get better if you avoid doing things that cause pain. Contact a doctor if you have a fever, chest  pain that gets worse, or new symptoms. Get help right away if you feel light-headed or you get short of breath. These symptoms may be an emergency. This information is not intended to replace advice given to you by your health care provider. Make sure you discuss any questions you have with your health care provider. Document Revised: 03/31/2022 Document Reviewed: 03/31/2022 Elsevier Patient Education  2024 [employer].

## 2024-01-20 NOTE — Progress Notes (Signed)
 Acute Office Visit  Subjective:     Patient ID: Darlene Ochoa, female    DOB: [birth year], 58 y.o.   MRN: [id number]   HPI Discussed the use of AI scribe software for clinical note transcription with the patient, who gave verbal consent to proceed.  History of Present Illness Darlene Ochoa is a 58 year old female who presents with left-sided chest pain.   Chest pain - Left-sided chest pain for the past couple of weeks - Pinching sensation radiating to the back of the shoulder - Pain severity rated as 7 out of 10 and described as 'annoying' - Pain worsened after returning from vacation - No relationship of pain to food intake or reflux symptoms - No worsening of pain with arm movement - Describes sensation as 'whole boob is just gonna explode'  Associated and constitutional symptoms - No abdominal pain, nausea, vomiting, or bowel changes - No urinary symptoms - No upper respiratory symptoms such as coughing or chills  Musculoskeletal symptoms and history - History of costochondritis with previous similar pulling sensation in the chest - No recent use of anti-inflammatory medications - Took Excedrin yesterday - Cares for eighteen-month-old granddaughter, which may involve activities that could exacerbate musculoskeletal pain  Pt requested fasting labs to get done in next few days.     ROS See HPI.      Objective:    BP 130/80   Pulse 80   Ht 5' 5 (1.651 m)   Wt 140 lb (63.5 kg)   LMP 06/18/2017 (Approximate)   SpO2 99%   BMI 23.30 kg/m  BP Readings from Last 3 Encounters:  01/20/24 130/80  06/15/23 134/75  04/20/23 118/73   Wt Readings from Last 3 Encounters:  01/20/24 140 lb (63.5 kg)  06/15/23 138 lb (62.6 kg)  04/20/23 138 lb (62.6 kg)      Physical Exam Constitutional:      Appearance: She is well-developed.  HENT:     Head: Normocephalic.  Cardiovascular:     Rate and Rhythm: Normal rate and regular rhythm.     Heart sounds: Normal heart sounds.   Pulmonary:     Effort: Pulmonary effort is normal.     Breath sounds: Normal breath sounds.  Chest:     Chest wall: Tenderness present. No mass or edema.     Comments: Left chest wall tenderness. No swelling, redness or warmth.  Abdominal:     General: Bowel sounds are normal. There is no abdominal bruit.     Palpations: Abdomen is soft. There is no hepatomegaly or mass.     Tenderness: There is no abdominal tenderness. There is no guarding or rebound.  Musculoskeletal:     Cervical back: Normal range of motion and neck supple.     Right lower leg: No edema.     Left lower leg: No edema.  Neurological:     Mental Status: She is alert.  Psychiatric:        Mood and Affect: Mood normal.          Assessment & Plan:  SABRASABRAKamorie was seen today for chest pain.  Diagnoses and all orders for this visit:  Left-sided chest wall pain -     EKG 12-Lead -     DG Chest 2 View; Future -     predniSONE  (DELTASONE ) 50 MG tablet; Take one tablet once a day for 5 days. -     TSH + free T4 -     CBC  w/Diff/Platelet -     Sed Rate (ESR) -     C-reactive protein -     Troponin T -     CK total and CKMB (cardiac)not at [facility] -     CK total and CKMB (cardiac)not at [facility] -     C-reactive protein -     Sed Rate (ESR) -     CBC w/Diff/Platelet -     TSH + free T4  Essential hypertension -     CMP14+EGFR -     TSH + free T4 -     CBC w/Diff/Platelet -     CBC w/Diff/Platelet -     TSH + free T4 -     CMP14+EGFR  Medication management -     Lipid panel -     CMP14+EGFR -     TSH + free T4 -     CBC w/Diff/Platelet -     VITAMIN D  25 Hydroxy (Vit-D Deficiency, Fractures) -     VITAMIN D  25 Hydroxy (Vit-D Deficiency, Fractures) -     CBC w/Diff/Platelet -     TSH + free T4 -     CMP14+EGFR -     Lipid panel  Vitamin D  deficiency -     VITAMIN D  25 Hydroxy (Vit-D Deficiency, Fractures) -     VITAMIN D  25 Hydroxy (Vit-D Deficiency, Fractures)  Elevated LDL cholesterol level -      Lipid panel -     Lipid panel  Screening for diabetes mellitus -     CMP14+EGFR -     CMP14+EGFR    Assessment & Plan Left-sided musculoskeletal chest wall pain Left-sided chest pain likely musculoskeletal, possibly costochondritis. Recent EKG negative for ST elevation/depression or arrhthymias and CP is not exertional. No symptoms to suggest costochondritis after viral infection or GERD/PUD/cholecystitis/pancreatitis.  - Order chest X-ray to rule out other causes. -troponin and CKMB ordered stat - Advise ice application to reduce inflammation. - Prescribe prednisone  for 5 days, stop mobic  while on prednisone . - Provide exercises to alleviate pain. - consider muscle relaxer if not improving.  - follow up if not improving or symptoms worsening.   Fasting labs ordered to have done at another time.     Pilot Prindle, PA-C

## 2024-01-20 NOTE — Progress Notes (Signed)
 Normal chest x-ray.

## 2024-01-20 NOTE — Progress Notes (Signed)
 Normal troponin a cardiac enzyme.

## 2024-01-21 LAB — CBC WITH DIFFERENTIAL/PLATELET
Basophils Absolute: 0 x10E3/uL (ref 0.0–0.2)
Basos: 1 %
EOS (ABSOLUTE): 0.2 x10E3/uL (ref 0.0–0.4)
Eos: 4 %
Hematocrit: 39.7 % (ref 34.0–46.6)
Hemoglobin: 13 g/dL (ref 11.1–15.9)
Immature Grans (Abs): 0 x10E3/uL (ref 0.0–0.1)
Immature Granulocytes: 0 %
Lymphocytes Absolute: 1.7 x10E3/uL (ref 0.7–3.1)
Lymphs: 35 %
MCH: 30.6 pg (ref 26.6–33.0)
MCHC: 32.7 g/dL (ref 31.5–35.7)
MCV: 93 fL (ref 79–97)
Monocytes Absolute: 0.4 x10E3/uL (ref 0.1–0.9)
Monocytes: 8 %
Neutrophils Absolute: 2.5 x10E3/uL (ref 1.4–7.0)
Neutrophils: 52 %
Platelets: 229 x10E3/uL (ref 150–450)
RBC: 4.25 x10E6/uL (ref 3.77–5.28)
RDW: 12.1 % (ref 11.7–15.4)
WBC: 4.9 x10E3/uL (ref 3.4–10.8)

## 2024-01-21 LAB — TSH+FREE T4
Free T4: 1.05 ng/dL (ref 0.82–1.77)
TSH: 1.65 u[IU]/mL (ref 0.450–4.500)

## 2024-01-21 LAB — C-REACTIVE PROTEIN: CRP: <1 mg/L (ref 0–10)

## 2024-01-21 LAB — CMP14+EGFR
ALT: 13 IU/L (ref 0–32)
AST: 19 IU/L (ref 0–40)
Albumin: 4.5 g/dL (ref 3.8–4.9)
Alkaline Phosphatase: 69 IU/L (ref 49–135)
BUN/Creatinine Ratio: 19 (ref 9–23)
BUN: 17 mg/dL (ref 6–24)
Bilirubin Total: 0.3 mg/dL (ref 0.0–1.2)
CO2: 24 mmol/L (ref 20–29)
Calcium: 9.2 mg/dL (ref 8.7–10.2)
Chloride: 102 mmol/L (ref 96–106)
Creatinine, Ser: 0.89 mg/dL (ref 0.57–1.00)
Globulin, Total: 2.3 g/dL (ref 1.5–4.5)
Glucose: 75 mg/dL (ref 70–99)
Potassium: 3.9 mmol/L (ref 3.5–5.2)
Sodium: 140 mmol/L (ref 134–144)
Total Protein: 6.8 g/dL (ref 6.0–8.5)
eGFR: 75 mL/min/1.73 (ref >59)

## 2024-01-21 LAB — CK TOTAL AND CKMB (NOT AT ARMC)
CK-MB Index: 2.2 ng/mL (ref 0.0–5.3)
Total CK: 95 U/L (ref 32–182)

## 2024-01-21 LAB — LIPID PANEL
Chol/HDL Ratio: 4 ratio (ref 0.0–4.4)
Cholesterol, Total: 209 mg/dL — ABNORMAL HIGH (ref 100–199)
HDL: 52 mg/dL (ref >39)
LDL Chol Calc (NIH): 138 mg/dL — ABNORMAL HIGH (ref 0–99)
Triglycerides: 106 mg/dL (ref 0–149)
VLDL Cholesterol Cal: 19 mg/dL (ref 5–40)

## 2024-01-21 LAB — VITAMIN D 25 HYDROXY (VIT D DEFICIENCY, FRACTURES): Vit D, 25-Hydroxy: 35.7 ng/mL (ref 30.0–100.0)

## 2024-01-21 LAB — SEDIMENTATION RATE: Sed Rate: 4 mm/h (ref 0–40)

## 2024-01-22 LAB — TROPONIN T

## 2024-01-22 NOTE — Progress Notes (Signed)
 Darlene Ochoa,   No abnormal muscle break down.  Kidney, liver, glucose look good.  Thyroid normal.  Normal WBC and hemoglobin.  Vitamin D  low normal. How much are you taking daily?  HDL, good cholesterol, looks good.  LDL, bad cholesterol, not to goal.   10 year risk is under 7.5 percent. Continue with diet and exercise. Keep diet low in fried/fatty/processed foods and get 150 minutes of exercise a week.   SABRA.The 10-year ASCVD risk score (Arnett DK, et al., 2019) is: 4%   Values used to calculate the score:     Age: 58 years     Clincally relevant sex: Female     Is Non-Hispanic African American: No     Diabetic: No     Tobacco smoker: No     Systolic Blood Pressure: 130 mmHg     Is BP treated: Yes     HDL Cholesterol: 52 mg/dL     Total Cholesterol: 209 mg/dL

## 2024-01-25 ENCOUNTER — Encounter: Payer: Self-pay | Admitting: Behavioral Health

## 2024-01-25 ENCOUNTER — Ambulatory Visit: Admitting: Behavioral Health

## 2024-01-25 DIAGNOSIS — F411 Generalized anxiety disorder: Secondary | ICD-10-CM

## 2024-01-25 NOTE — Telephone Encounter (Signed)
-----   Message from [facility] sent at 01/22/2024  6:59 AM EDT ----- Devere,   No abnormal muscle break down.  Kidney, liver, glucose look good.  Thyroid normal.  Normal WBC and hemoglobin.  Vitamin D  low normal. How much are you taking daily?  HDL, good cholesterol, looks good.  LDL, bad cholesterol, not to goal.   10 year risk is under 7.5 percent. Continue with diet and exercise. Keep diet low in fried/fatty/processed foods and get 150 minutes of exercise a week.   SABRA.The 10-year ASCVD risk score (Arnett DK, et al., 2019) is: 4%   Values used to calculate the score:     Age: 58 years     Clincally relevant sex: Female     Is Non-Hispanic African American: No     Diabetic: No     Tobacco smoker: No     Systolic Blood Pressure: 130 mmHg     Is BP treated: Yes     HDL Cholesterol: 52 mg/dL     Total Cholesterol: 209 mg/dL  ----- Message ----- From: Interface, [facility] Lab Results In Sent: 01/20/2024   1:35 PM EDT To: Vermell LITTIE Bologna, PA-C

## 2024-01-25 NOTE — Progress Notes (Addendum)
 [facility] Counselor/Therapist Progress Note  Patient ID: Darlene Ochoa, MRN: [id number],    Date: 01/25/2024  Time Spent: 9:14 AM until 9:55 AM, 41 minutes   spent in person with the patient in the outpatient therapist office.  Treatment Type: Individual Therapy  Reported Symptoms: Anxiety/stress  Mental Status Exam: Appearance:  Well Groomed     Behavior: Appropriate  Motor: Normal  Speech/Language:  Clear and Coherent  Affect: Appropriate  Mood: normal  Thought process: normal  Thought content:   WNL  Sensory/Perceptual disturbances:   WNL  Orientation: oriented to person, place, time/date, situation, day of week, month of year, and year  Attention: Good  Concentration: Good  Memory: WNL  Fund of knowledge:  Good  Insight:   Good  Judgment:  Good  Impulse Control: Good   Risk Assessment: Danger to Self:  No Self-injurious Behavior: No Danger to Others: No Duty to Warn:no Physical Aggression / Violence:No  Access to Firearms a concern: No  Gang Involvement:No   Subjective: The patient reported that she went to see her primary care physician today because she was having chest pains and pain in her arm.  Thankfully there was nothing medical and she recognized the significant amount of stress that she is on her primarily related to her children and grandchildren.  She started keeping 1 grandchild  2 days a week her daughter-in-law's mother passed away she picked up an additional day.  When her daughter had a child she asu,med full-time care for that granddaughter.  One of the granddaughters is in preschool a few days a week for half a day and the other is in preschool half a day 2 days a week but she said otherwise she has them from 7 AM until about 530 or 6 most days and  is exhausted.  Her son is fairly aware of her situation but she says her daughter seems to be fairly clueless about how much stress it is putting on the patient.  She feels like her son and  daughter-in-law could make other arrangements at least partially for their daughter's care.  She said she said something somewhat passively to her son but I encouraged her to say things more directly to him about other care for her granddaughter.  She says financially that her daughter cannot afford daycare but she has several things which she spends a lot of money on and I talked about coming up with what she sees as a optimal plan for her in terms of how much time she will be here for her granddaughters and her children making arrangements for them otherwise so that she can take some of the stress off of her.  We talked about what she could say to each of them and how she could address that directly and respectfully to better provide self-care. She does contract for safety having no thoughts of hurting herself or anyone else. Interventions: Cognitive Behavioral Therapy  Diagnosis: Generalized anxiety disorder Treatment plan: Goals will be to use cognitive behavioral therapy as well as elements of dialectical behavior reduce the patient's anxiety by at least 50% with a target date of June 19, 2023.  Goals will be to improve her ability to better manage stress and anxiety symptoms, identify and process causes for her anxiety and stress as well as resolving or conflicts contributing to the anxiety.  Lastly we will help her manage thoughts and worrisome thinking contributing to feelings of anxiety and stress.  Interventions will include educating the  patient about anxiety so that she can see its causes symptoms and triggers.  We will provide problem solution skills to help her identify options for resolving stress as well as coping skills such as biofeedback, guided imagery etc. to manage stress and anxiety.  We will use cognitive behavioral therapy to identify and change anxiety provoking thought behavior patterns as well as dialectical behavior therapy to teach distress tolerance and mindfulness  skills. Progress: 40% I reviewed the treatment plan with the patient and with the patient's agreement we will extend a new goal target date of June 18, 2024 Plan: I will meet with the patient every 2 to 3 weeks in person.    Lorrene CHRISTELLA Hasten, [facility]                                                Lorrene CHRISTELLA Hasten, [facility]

## 2024-01-25 NOTE — Telephone Encounter (Signed)
 Spoke with patient regarding her lab results, Patient gave a verbal understanding.   Patienilyt states that she takes Vitamin D3 1,000 mcg daily.

## 2024-02-02 ENCOUNTER — Other Ambulatory Visit: Payer: Self-pay | Admitting: Physician Assistant

## 2024-02-02 DIAGNOSIS — I10 Essential (primary) hypertension: Secondary | ICD-10-CM

## 2024-02-08 ENCOUNTER — Ambulatory Visit: Admitting: Behavioral Health

## 2024-02-08 NOTE — Progress Notes (Unsigned)
   Darlene Ochoa, Darlene Ochoa

## 2024-02-29 ENCOUNTER — Ambulatory Visit: Admitting: Behavioral Health

## 2024-03-14 ENCOUNTER — Ambulatory Visit: Admitting: Behavioral Health

## 2024-03-14 ENCOUNTER — Encounter: Payer: Self-pay | Admitting: Behavioral Health

## 2024-03-14 DIAGNOSIS — F411 Generalized anxiety disorder: Secondary | ICD-10-CM | POA: Diagnosis not present

## 2024-03-14 NOTE — Progress Notes (Signed)
 [facility] Counselor/Therapist Progress Note  Patient ID: Darlene Ochoa, MRN: [id number],    Date: 03/14/2024  Time Spent: 9:12 AM until 10:00 AM, 48 minutes   spent in person with the patient in the outpatient therapist office.  Treatment Type: Individual Therapy  Reported Symptoms: Anxiety/stress  Mental Status Exam: Appearance:  Well Groomed     Behavior: Appropriate  Motor: Normal  Speech/Language:  Clear and Coherent  Affect: Appropriate  Mood: normal  Thought process: normal  Thought content:   WNL  Sensory/Perceptual disturbances:   WNL  Orientation: oriented to person, place, time/date, situation, day of week, month of year, and year  Attention: Good  Concentration: Good  Memory: WNL  Fund of knowledge:  Good  Insight:   Good  Judgment:  Good  Impulse Control: Good   Risk Assessment: Danger to Self:  No Self-injurious Behavior: No Danger to Others: No Duty to Warn:no Physical Aggression / Violence:No  Access to Firearms a concern: No  Gang Involvement:No   Subjective: The patient has had a virus over the last couple weeks and is finally feeling better.  She had to cancel her last appointment because of that.  Says that part of that is related to her stress level.  For the most part she still has 2 or 2 granddaughters most of the week by herself and although she loves that it is exhausting.  Her 2 adult daughters are making some decisions which she does not agree with that she started to address it but she says that she is getting to the point where she is going to be much more assertive in her communication with him because it impacts the patient and their life as well as her granddaughter's lives.  Talked about how she could respectfully and assertively communicate her frustrations and look for ways to be an agent of change in a way that impact her life or reducing her stress and anxiety level. She does contract for safety having no thoughts of hurting  herself or anyone else. Interventions: Cognitive Behavioral Therapy  Diagnosis: Generalized anxiety disorder Treatment plan: Goals will be to use cognitive behavioral therapy as well as elements of dialectical behavior reduce the patient's anxiety by at least 50% with a target date of June 19, 2023.  Goals will be to improve her ability to better manage stress and anxiety symptoms, identify and process causes for her anxiety and stress as well as resolving or conflicts contributing to the anxiety.  Lastly we will help her manage thoughts and worrisome thinking contributing to feelings of anxiety and stress.  Interventions will include educating the patient about anxiety so that she can see its causes symptoms and triggers.  We will provide problem solution skills to help her identify options for resolving stress as well as coping skills such as biofeedback, guided imagery etc. to manage stress and anxiety.  We will use cognitive behavioral therapy to identify and change anxiety provoking thought behavior patterns as well as dialectical behavior therapy to teach distress tolerance and mindfulness skills. Progress: 40% I reviewed the treatment plan with the patient and with the patient's agreement we will extend a new goal target date of June 18, 2024 Plan: I will meet with the patient every 2 to 3 weeks in person.    Lorrene CHRISTELLA Hasten, [facility]  Lorrene CHRISTELLA Hasten, [facility]               Lorrene CHRISTELLA Hasten, [facility]

## 2024-03-16 DIAGNOSIS — F4322 Adjustment disorder with anxiety: Secondary | ICD-10-CM | POA: Diagnosis not present

## 2024-03-16 DIAGNOSIS — F3342 Major depressive disorder, recurrent, in full remission: Secondary | ICD-10-CM | POA: Diagnosis not present

## 2024-03-16 DIAGNOSIS — F411 Generalized anxiety disorder: Secondary | ICD-10-CM | POA: Diagnosis not present

## 2024-03-28 ENCOUNTER — Ambulatory Visit: Admitting: Behavioral Health

## 2024-03-28 ENCOUNTER — Encounter: Payer: Self-pay | Admitting: Behavioral Health

## 2024-03-28 DIAGNOSIS — F411 Generalized anxiety disorder: Secondary | ICD-10-CM | POA: Diagnosis not present

## 2024-03-28 NOTE — Progress Notes (Signed)
 [facility] Counselor/Therapist Progress Note  Patient ID: Darlene Ochoa, MRN: [id number],    Date: 03/28/2024  Time Spent: 9:02 AM until 9:56AM, 54 minutes   spent in person with the patient in the outpatient therapist office.  Treatment Type: Individual Therapy  Reported Symptoms: Anxiety/stress  Mental Status Exam: Appearance:  Well Groomed     Behavior: Appropriate  Motor: Normal  Speech/Language:  Clear and Coherent  Affect: Appropriate  Mood: normal  Thought process: normal  Thought content:   WNL  Sensory/Perceptual disturbances:   WNL  Orientation: oriented to person, place, time/date, situation, day of week, month of year, and year  Attention: Good  Concentration: Good  Memory: WNL  Fund of knowledge:  Good  Insight:   Good  Judgment:  Good  Impulse Control: Good   Risk Assessment: Danger to Self:  No Self-injurious Behavior: No Danger to Others: No Duty to Warn:no Physical Aggression / Violence:No  Access to Firearms a concern: No  Gang Involvement:No   Subjective: Patient presents with brighter affect today.  1 daughter is enjoying her new job and the other 1 will be starting a new job the first of the year.  That will be a schedule that will allow the patient to have a day off from keeping her grandchildren.  She loves doing that but when she has both of them 5 days a week it is very difficult for her.  2 of them are now at least in daycare in the morning so she has some mornings free but would have full days free when her daughter starts a new job.  She is doing a better job of letting go and letting them struggle some the difficulties of being a young adult and not worrying about as much.  Financially they are in good shape and her husband has a job that he does well and enjoys.  Encouraged her to get back to some of those things that she enjoys as she has more time as a way to relieve anxiety.  She does contract for safety having no thoughts of  hurting herself or anyone else. Interventions: Cognitive Behavioral Therapy  Diagnosis: Generalized anxiety disorder Treatment plan: Goals will be to use cognitive behavioral therapy as well as elements of dialectical behavior reduce the patient's anxiety by at least 50% with a target date of June 19, 2023.  Goals will be to improve her ability to better manage stress and anxiety symptoms, identify and process causes for her anxiety and stress as well as resolving or conflicts contributing to the anxiety.  Lastly we will help her manage thoughts and worrisome thinking contributing to feelings of anxiety and stress.  Interventions will include educating the patient about anxiety so that she can see its causes symptoms and triggers.  We will provide problem solution skills to help her identify options for resolving stress as well as coping skills such as biofeedback, guided imagery etc. to manage stress and anxiety.  We will use cognitive behavioral therapy to identify and change anxiety provoking thought behavior patterns as well as dialectical behavior therapy to teach distress tolerance and mindfulness skills. Progress: 40% I reviewed the treatment plan with the patient and with the patient's agreement we will extend a new goal target date of June 18, 2024 Plan: I will meet with the patient every 2 to 3 weeks in person.    Lorrene CHRISTELLA Hasten, [facility]  Lorrene CHRISTELLA Hasten, [facility]               Lorrene CHRISTELLA Hasten, [facility]               Lorrene CHRISTELLA Hasten, [facility]

## 2024-04-06 ENCOUNTER — Ambulatory Visit: Admitting: Family Medicine

## 2024-04-06 ENCOUNTER — Encounter: Payer: Self-pay | Admitting: Family Medicine

## 2024-04-06 VITALS — BP 115/75 | HR 68 | Ht 65.0 in | Wt 138.0 lb

## 2024-04-06 DIAGNOSIS — H66002 Acute suppurative otitis media without spontaneous rupture of ear drum, left ear: Secondary | ICD-10-CM

## 2024-04-06 DIAGNOSIS — H6690 Otitis media, unspecified, unspecified ear: Secondary | ICD-10-CM | POA: Insufficient documentation

## 2024-04-06 MED ORDER — PREDNISONE 20 MG PO TABS: 20.0000 mg | ORAL_TABLET | Freq: Two times a day (BID) | ORAL | 0 refills | Status: AC

## 2024-04-06 MED ORDER — CLARITHROMYCIN 500 MG PO TABS: 500.0000 mg | ORAL_TABLET | Freq: Two times a day (BID) | ORAL | 0 refills | Status: AC

## 2024-04-06 NOTE — Assessment & Plan Note (Signed)
 She has multiple allergies.  Did well with clarithromycin  previously and will add this on.  Adding prednisone  burst.  Stay well hydrated. Red flags reviewed.  Return if not improving.

## 2024-04-06 NOTE — Progress Notes (Signed)
 Darlene Ochoa - 58 y.o. female MRN [id number]  Date of birth: 1965-09-28  Subjective Chief Complaint  Patient presents with   Sinusitis    HPI Darlene Ochoa is a 58 y.o. female here today with complaint of sinus congestion and cough for the past 3-4 weeks. Worsening with more localization to the L sinus and ear.  Feels ear pain and pressure along with sinus pressure.  Denies drianage from the ear.  No fevers or chills..  No vertigo symptoms.  She has not really tried anything over the counter.  She is drinking plenty of fluids. She is using a humidifier at home.   ROS:  A comprehensive ROS was completed and negative except as noted per HPI  Allergies[1]  Past Medical History:  Diagnosis Date   Anxiety    Chest pain 03/31/2012   Depression    H/O prolonged Q-T interval on ECG 03/31/2012   Hypertension    Major depressive disorder 03/31/2012   Seasonal allergies     Past Surgical History:  Procedure Laterality Date   CESAREAN SECTION  1998   ROTATOR CUFF REPAIR Right 2021    Social History   Socioeconomic History   Marital status: Married    Spouse name: Not on file   Number of children: Not on file   Years of education: Not on file   Highest education level: Not on file  Occupational History   Not on file  Tobacco Use   Smoking status: Never   Smokeless tobacco: Never  Vaping Use   Vaping status: Never Used  Substance and Sexual Activity   Alcohol use: No   Drug use: No   Sexual activity: Yes    Partners: Male  Other Topics Concern   Not on file  Social History Narrative   Not on file   Social Drivers of Health   Tobacco Use: Low Risk (03/28/2024)   Patient History    Smoking Tobacco Use: Never    Smokeless Tobacco Use: Never    Passive Exposure: Not on file  Financial Resource Strain: Low Risk (01/04/2024)   Received from [facility]   Overall Financial Resource Strain (CARDIA)    How hard is it for you to pay for the very basics like food, housing,  medical care, and heating?: Not hard at all  Food Insecurity: No Food Insecurity (01/04/2024)   Received from [facility]   Epic    Within the past 12 months, you worried that your food would run out before you got the money to buy more.: Never true    Within the past 12 months, the food you bought just didn't last and you didn't have money to get more.: Never true  Transportation Needs: No Transportation Needs (01/04/2024)   Received from [facility]    In the past 12 months, has lack of transportation kept you from medical appointments or from getting medications?: No    In the past 12 months, has lack of transportation kept you from meetings, work, or from getting things needed for daily living?: No  Physical Activity: Insufficiently Active (01/04/2024)   Received from [facility]   Exercise Vital Sign    On average, how many days per week do you engage in moderate to strenuous exercise (like a brisk walk)?: 3 days    On average, how many minutes do you engage in exercise at this level?: 20 min  Stress: No Stress Concern Present (01/04/2024)   Received from [facility]   [employer] of Occupational Health - Occupational Stress Questionnaire    Do you feel stress - tense, restless, nervous, or anxious, or unable to sleep at night because your mind is troubled all the time - these days?: Only a little  Social Connections: Moderately Integrated (01/04/2024)   Received from [facility]   Social Network    How would you rate your social network (family, work, friends)?: Adequate participation with social networks  Depression (PHQ2-9): Low Risk (02/24/2024)   Depression (PHQ2-9)    PHQ-2 Score: 0  Alcohol Screen: Not on file  Housing: Low Risk (01/04/2024)   Received from [facility]    In the last 12 months, was there a time when you were not able to pay the mortgage or rent on time?: No    In the past 12 months, how many times have you moved where you were  living?: 1    At any time in the past 12 months, were you homeless or living in a shelter (including now)?: No  Utilities: Not At Risk (01/04/2024)   Received from [facility]    In the past 12 months has the electric, gas, oil, or water company threatened to shut off services in your home?: No  Health Literacy: Not on file    Family History  Problem Relation Age of Onset   Diabetes Mother    Cancer Mother        Breast   Depression Mother    CAD Mother    Heart failure Father    Stroke Father    Alcohol abuse Father    Alcohol abuse Brother    Drug abuse Brother     Health Maintenance  Topic Date Due   COVID-19 Vaccine (1) Never done   DTaP/Tdap/Td (1 - Tdap) Never done   Zoster Vaccines- Shingrix (1 of 2) 05/26/2024 (Originally 09/13/1984)   Influenza Vaccine  07/19/2024 (Originally 11/20/2023)   Cervical Cancer Screening (HPV/Pap Cotest)  02/23/2025 (Originally 06/14/2018)   Pneumococcal Vaccine: 50+ Years (1 of 1 - PCV) 02/23/2025 (Originally 09/14/2015)   Hepatitis B Vaccines 19-59 Average Risk (1 of 3 - 19+ 3-dose series) 02/23/2025 (Originally 09/13/1984)   Mammogram  10/28/2024   Fecal DNA (Cologuard)  01/29/2025   Hepatitis C Screening  Completed   HIV Screening  Completed   HPV VACCINES  Aged Out   Meningococcal B Vaccine  Aged Out     ----------------------------------------------------------------------------------------------------------------------------------------------------------------------------------------------------------------- Physical Exam BP 115/75 (BP Location: Left Arm, Patient Position: Sitting, Cuff Size: Normal)   Pulse 68   Ht 5' 5 (1.651 m)   Wt 138 lb (62.6 kg)   LMP 06/18/2017   SpO2 99%   BMI 22.96 kg/m   Physical Exam Constitutional:      Appearance: Normal appearance.  Eyes:     General: No scleral icterus. Cardiovascular:     Rate and Rhythm: Normal rate and regular rhythm.  Pulmonary:     Effort: Pulmonary effort  is normal.     Breath sounds: Normal breath sounds.  Musculoskeletal:     Cervical back: Neck supple.  Neurological:     Mental Status: She is alert.  Psychiatric:        Mood and Affect: Mood normal.        Behavior: Behavior normal.     ------------------------------------------------------------------------------------------------------------------------------------------------------------------------------------------------------------------- Assessment and Plan  Otitis media She has multiple allergies.  Did well with clarithromycin  previously and will add this on.  Adding prednisone  burst.  Stay well hydrated. Red flags reviewed.  Return if not improving.    Meds ordered this encounter  Medications   clarithromycin  (BIAXIN ) 500 MG tablet    Sig: Take 1 tablet (500 mg total) by mouth 2 (two) times daily.    Dispense:  14 tablet    Refill:  0   predniSONE  (DELTASONE ) 20 MG tablet    Sig: Take 1 tablet (20 mg total) by mouth 2 (two) times daily with a meal for 5 days.    Dispense:  10 tablet    Refill:  0    No follow-ups on file.        [1]  Allergies Allergen Reactions   Cefuroxime  Swelling   Lactose Nausea And Vomiting   Milk (Cow) Nausea And Vomiting   Peanut Oil Other (See Comments)   Peanut-Containing Drug Products Rash   Pork Allergy  Nausea And Vomiting   Doxycycline  Nausea And Vomiting   Lactose Intolerance (Gi)    Sulfa Antibiotics     Other reaction(s): Other (See Comments)   Trazodone  And Nefazodone     Prolonged QTC   Codeine Rash   Penicillins Rash   Sulfonamide Derivatives Rash

## 2024-04-06 NOTE — Patient Instructions (Signed)
 Otitis Media, Adult    Otitis media is a condition in which the middle ear is red and swollen (inflamed) and full of fluid. The middle ear is the part of the ear that contains bones for hearing as well as air that helps send sounds to the brain. The condition usually goes away on its own.  What are the causes?  This condition is caused by a blockage in the eustachian tube. This tube connects the middle ear to the back of the nose. It normally allows air into the middle ear. The blockage is caused by fluid or swelling. Problems that can cause blockage include:  A cold or infection that affects the nose, mouth, or throat.  Allergies.  An irritant, such as tobacco smoke.  Adenoids that have become large. The adenoids are soft tissue located in the back of the throat, behind the nose and the roof of the mouth.  Growth or swelling in the upper part of the throat, just behind the nose (nasopharynx).  Damage to the ear caused by a change in pressure. This is called barotrauma.  What increases the risk?  You are more likely to develop this condition if you:  Smoke or are exposed to tobacco smoke.  Have an opening in the roof of your mouth (cleft palate).  Have acid reflux.  Have problems in your body's defense system (immune system).  What are the signs or symptoms?  Symptoms of this condition include:  Ear pain.  Fever.  Problems with hearing.  Being tired.  Fluid leaking from the ear.  Ringing in the ear.  How is this treated?  This condition can go away on its own within 3-5 days. But if the condition is caused by germs (bacteria) and does not go away on its own, or if it keeps coming back, your doctor may:  Give you antibiotic medicines.  Give you medicines for pain.  Follow these instructions at home:  Take over-the-counter and prescription medicines only as told by your doctor.  If you were prescribed an antibiotic medicine, take it as told by your doctor. Do not stop taking it even if you start to feel better.  Keep  all follow-up visits.  Contact a doctor if:  You have bleeding from your nose.  There is a lump on your neck.  You are not feeling better in 5 days.  You feel worse instead of better.  Get help right away if:  You have pain that is not helped with medicine.  You have swelling, redness, or pain around your ear.  You get a stiff neck.  You cannot move part of your face (paralysis).  You notice that the bone behind your ear hurts when you touch it.  You get a very bad headache.  Summary  Otitis media means that the middle ear is red, swollen, and full of fluid.  This condition usually goes away on its own.  If the problem does not go away, treatment may be needed. You may be given medicines to treat the infection or to treat your pain.  If you were prescribed an antibiotic medicine, take it as told by your doctor. Do not stop taking it even if you start to feel better.  Keep all follow-up visits.  This information is not intended to replace advice given to you by your health care provider. Make sure you discuss any questions you have with your health care provider.  Document Revised: 07/16/2020 Document Reviewed: 07/16/2020  Elsevier  Patient Education  2024 [employer].

## 2024-04-26 ENCOUNTER — Encounter: Payer: Self-pay | Admitting: Behavioral Health

## 2024-04-26 ENCOUNTER — Ambulatory Visit: Admitting: Behavioral Health

## 2024-04-26 DIAGNOSIS — F411 Generalized anxiety disorder: Secondary | ICD-10-CM | POA: Diagnosis not present

## 2024-04-26 NOTE — Progress Notes (Signed)
  Behavioral Health Counselor/Therapist Progress Note  Patient ID: Darlene Ochoa, MRN: [id number],    Date: 04/26/2024  Time Spent: 9:15 AM until 9:56AM, 41 minutes   spent in person with the patient in the outpatient therapist office.  Treatment Type: Individual Therapy  Reported Symptoms: Anxiety/stress  Mental Status Exam: Appearance:  Well Groomed     Behavior: Appropriate  Motor: Normal  Speech/Language:  Clear and Coherent  Affect: Appropriate  Mood: normal  Thought process: normal  Thought content:   WNL  Sensory/Perceptual disturbances:   WNL  Orientation: oriented to person, place, time/date, situation, day of week, month of year, and year  Attention: Good  Concentration: Good  Memory: WNL  Fund of knowledge:  Good  Insight:   Good  Judgment:  Good  Impulse Control: Good   Risk Assessment: Danger to Self:  No Self-injurious Behavior: No Danger to Others: No Duty to Warn:no Physical Aggression / Violence:No  Access to Firearms a concern: No  Gang Involvement:No   Subjective: For the most part the patient and her family had a good holiday season.  They were all sick around Christmas but were able to make it work and spend some time together.  She and her husband has spent some time together as he has been home over the holidays and not traveling as much and she is enjoying that.  She has had to set some very firm verbal physical and financial boundaries with 1 daughter especially but with both daughters.  In the past she said she would not have felt that she could do that but she is recognizing that there were choices they are making that are not necessarily good financially.  She has helped them try and understand the value of a budget and she has someone else doing the same thing so she is hoping that the girls will start to take more responsibility and how they care for themselves and how 1 daughter cares for her child also.  I encouraged the patient to continue to  be respectfully assertive and set very clear boundaries.  She does contract for safety having no thoughts of hurting herself or anyone else. Interventions: Cognitive Behavioral Therapy  Diagnosis: Generalized anxiety disorder Treatment plan: Goals will be to use cognitive behavioral therapy as well as elements of dialectical behavior reduce the patient's anxiety by at least 50% with a target date of June 19, 2023.  Goals will be to improve her ability to better manage stress and anxiety symptoms, identify and process causes for her anxiety and stress as well as resolving or conflicts contributing to the anxiety.  Lastly we will help her manage thoughts and worrisome thinking contributing to feelings of anxiety and stress.  Interventions will include educating the patient about anxiety so that she can see its causes symptoms and triggers.  We will provide problem solution skills to help her identify options for resolving stress as well as coping skills such as biofeedback, guided imagery etc. to manage stress and anxiety.  We will use cognitive behavioral therapy to identify and change anxiety provoking thought behavior patterns as well as dialectical behavior therapy to teach distress tolerance and mindfulness skills. Progress: 40% I reviewed the treatment plan with the patient and with the patient's agreement we will extend a new goal target date of June 18, 2024 Plan: I will meet with the patient every 2 to 3 weeks in person.    Lorrene CHRISTELLA Hasten, [facility]  Lorrene CHRISTELLA Hasten, [facility]               Lorrene CHRISTELLA Hasten, [facility]               Lorrene CHRISTELLA Hasten, [facility]               Lorrene CHRISTELLA Hasten, [facility]

## 2024-05-04 ENCOUNTER — Other Ambulatory Visit: Payer: Self-pay | Admitting: Physician Assistant

## 2024-05-04 DIAGNOSIS — I10 Essential (primary) hypertension: Secondary | ICD-10-CM

## 2024-05-10 ENCOUNTER — Ambulatory Visit (INDEPENDENT_AMBULATORY_CARE_PROVIDER_SITE_OTHER): Payer: Self-pay | Admitting: Behavioral Health

## 2024-05-10 ENCOUNTER — Encounter: Payer: Self-pay | Admitting: Behavioral Health

## 2024-05-10 DIAGNOSIS — F411 Generalized anxiety disorder: Secondary | ICD-10-CM

## 2024-05-10 NOTE — Progress Notes (Signed)
 [facility] Counselor/Therapist Progress Note  Patient ID: Darlene Ochoa, MRN: [id number],    Date: 05/10/2024  Time Spent: 9:12 AM until 9:58AM, 46 minutes   spent in person with the patient in the outpatient therapist office.  Treatment Type: Individual Therapy  Reported Symptoms: Anxiety/stress  Mental Status Exam: Appearance:  Well Groomed     Behavior: Appropriate  Motor: Normal  Speech/Language:  Clear and Coherent  Affect: Appropriate  Mood: normal  Thought process: normal  Thought content:   WNL  Sensory/Perceptual disturbances:   WNL  Orientation: oriented to person, place, time/date, situation, day of week, month of year, and year  Attention: Good  Concentration: Good  Memory: WNL  Fund of knowledge:  Good  Insight:   Good  Judgment:  Good  Impulse Control: Good   Risk Assessment: Danger to Self:  No Self-injurious Behavior: No Danger to Others: No Duty to Warn:no Physical Aggression / Violence:No  Access to Firearms a concern: No  Gang Involvement:No   Subjective: The patient has began to set firmer boundaries and stay consistent with them as she is dealing with some frustrations with certain family members because they have not been respectful or appreciative of all that the patient and her husband have done for them.  She will continue to support them but would look at how she supports them differently so that they take more responsibility and ownership and the things that they need to be doing as opposed to expecting the patient and her husband to rescue them if they do not make great decisions.  She acknowledges that in the past she does not feel she could have done this but knows now that is what is best for her and keeping her anxiety level down as well as for family members moving forward. I encouraged the patient to continue to be respectfully assertive and set very clear boundaries.  She does contract for safety having no thoughts of hurting  herself or anyone else. Interventions: Cognitive Behavioral Therapy  Diagnosis: Generalized anxiety disorder Treatment plan: Goals will be to use cognitive behavioral therapy as well as elements of dialectical behavior reduce the patient's anxiety by at least 50% with a target date of June 19, 2023.  Goals will be to improve her ability to better manage stress and anxiety symptoms, identify and process causes for her anxiety and stress as well as resolving or conflicts contributing to the anxiety.  Lastly we will help her manage thoughts and worrisome thinking contributing to feelings of anxiety and stress.  Interventions will include educating the patient about anxiety so that she can see its causes symptoms and triggers.  We will provide problem solution skills to help her identify options for resolving stress as well as coping skills such as biofeedback, guided imagery etc. to manage stress and anxiety.  We will use cognitive behavioral therapy to identify and change anxiety provoking thought behavior patterns as well as dialectical behavior therapy to teach distress tolerance and mindfulness skills. Progress: 40% I reviewed the treatment plan with the patient and with the patient's agreement we will extend a new goal target date of June 18, 2024 Plan: I will meet with the patient every 2 to 3 weeks in person.    Lorrene CHRISTELLA Hasten, [facility]  Lorrene CHRISTELLA Hasten, [facility]               Lorrene CHRISTELLA Hasten, [facility]               Lorrene CHRISTELLA Hasten, [facility]               Lorrene CHRISTELLA Hasten, [facility]               Lorrene CHRISTELLA Hasten, [facility]

## 2024-05-24 ENCOUNTER — Ambulatory Visit: Payer: Self-pay | Admitting: Behavioral Health

## 2024-06-07 ENCOUNTER — Ambulatory Visit: Payer: Self-pay | Admitting: Behavioral Health

## 2024-06-21 ENCOUNTER — Ambulatory Visit: Admitting: Behavioral Health
